# Patient Record
Sex: Male | Born: 1960
Health system: Southern US, Community
[De-identification: ages and names within clinical notes are randomized; demographics above are authoritative.]

## PROBLEM LIST (undated history)

## (undated) DIAGNOSIS — I480 Paroxysmal atrial fibrillation: Secondary | ICD-10-CM

## (undated) DIAGNOSIS — E785 Hyperlipidemia, unspecified: Secondary | ICD-10-CM

## (undated) DIAGNOSIS — I499 Cardiac arrhythmia, unspecified: Secondary | ICD-10-CM

## (undated) DIAGNOSIS — K922 Gastrointestinal hemorrhage, unspecified: Secondary | ICD-10-CM

## (undated) DIAGNOSIS — I10 Essential (primary) hypertension: Secondary | ICD-10-CM

## (undated) DIAGNOSIS — I517 Cardiomegaly: Secondary | ICD-10-CM

## (undated) DIAGNOSIS — E039 Hypothyroidism, unspecified: Secondary | ICD-10-CM

## (undated) DIAGNOSIS — H269 Unspecified cataract: Secondary | ICD-10-CM

## (undated) DIAGNOSIS — G4733 Obstructive sleep apnea (adult) (pediatric): Secondary | ICD-10-CM

## (undated) HISTORY — DX: Unspecified cataract: H26.9

## (undated) HISTORY — DX: Hyperlipidemia, unspecified: E78.5

## (undated) HISTORY — DX: Paroxysmal atrial fibrillation: I48.0

## (undated) HISTORY — DX: Gastrointestinal hemorrhage, unspecified: K92.2

## (undated) HISTORY — DX: Obstructive sleep apnea (adult) (pediatric): G47.33

## (undated) HISTORY — PX: DOPPLER ECHOCARDIOGRAPHY: SHX263

## (undated) HISTORY — DX: Hypothyroidism, unspecified: E03.9

## (undated) HISTORY — DX: Essential (primary) hypertension: I10

## (undated) HISTORY — DX: Cardiomegaly: I51.7

---

## 1999-05-26 ENCOUNTER — Emergency Department (HOSPITAL_COMMUNITY): Admission: EM | Admit: 1999-05-26 | Discharge: 1999-05-26 | Payer: Self-pay | Admitting: Emergency Medicine

## 1999-07-24 ENCOUNTER — Ambulatory Visit (HOSPITAL_BASED_OUTPATIENT_CLINIC_OR_DEPARTMENT_OTHER): Admission: RE | Admit: 1999-07-24 | Discharge: 1999-07-24 | Payer: Self-pay | Admitting: General Surgery

## 2001-08-26 ENCOUNTER — Emergency Department (HOSPITAL_COMMUNITY): Admission: EM | Admit: 2001-08-26 | Discharge: 2001-08-26 | Payer: Self-pay | Admitting: Emergency Medicine

## 2002-03-23 ENCOUNTER — Emergency Department (HOSPITAL_COMMUNITY): Admission: EM | Admit: 2002-03-23 | Discharge: 2002-03-23 | Payer: Self-pay | Admitting: Emergency Medicine

## 2002-03-29 ENCOUNTER — Emergency Department (HOSPITAL_COMMUNITY): Admission: EM | Admit: 2002-03-29 | Discharge: 2002-03-29 | Payer: Self-pay | Admitting: Emergency Medicine

## 2006-12-24 ENCOUNTER — Emergency Department (HOSPITAL_COMMUNITY): Admission: EM | Admit: 2006-12-24 | Discharge: 2006-12-24 | Payer: Self-pay | Admitting: Emergency Medicine

## 2009-04-04 ENCOUNTER — Ambulatory Visit: Payer: Self-pay | Admitting: Family Medicine

## 2009-04-17 ENCOUNTER — Ambulatory Visit: Payer: Self-pay | Admitting: Family Medicine

## 2009-04-23 ENCOUNTER — Ambulatory Visit: Payer: Self-pay | Admitting: Family Medicine

## 2009-05-02 ENCOUNTER — Ambulatory Visit: Payer: Self-pay | Admitting: Internal Medicine

## 2009-05-09 ENCOUNTER — Ambulatory Visit: Payer: Self-pay | Admitting: Internal Medicine

## 2009-06-18 ENCOUNTER — Ambulatory Visit: Payer: Self-pay | Admitting: Family Medicine

## 2009-09-16 ENCOUNTER — Ambulatory Visit: Payer: Self-pay | Admitting: Family Medicine

## 2009-12-17 ENCOUNTER — Ambulatory Visit: Payer: Self-pay | Admitting: Family Medicine

## 2010-03-19 ENCOUNTER — Ambulatory Visit: Payer: Self-pay | Admitting: Family Medicine

## 2010-06-18 ENCOUNTER — Ambulatory Visit: Payer: Self-pay | Admitting: Family Medicine

## 2010-06-25 ENCOUNTER — Ambulatory Visit
Admission: RE | Admit: 2010-06-25 | Discharge: 2010-06-25 | Payer: Self-pay | Source: Home / Self Care | Attending: Family Medicine | Admitting: Family Medicine

## 2010-08-06 ENCOUNTER — Ambulatory Visit (INDEPENDENT_AMBULATORY_CARE_PROVIDER_SITE_OTHER): Payer: BC Managed Care – PPO | Admitting: Family Medicine

## 2010-08-06 DIAGNOSIS — I10 Essential (primary) hypertension: Secondary | ICD-10-CM

## 2010-08-06 DIAGNOSIS — Z79899 Other long term (current) drug therapy: Secondary | ICD-10-CM

## 2010-10-03 ENCOUNTER — Ambulatory Visit: Payer: BC Managed Care – PPO | Admitting: Family Medicine

## 2011-03-16 ENCOUNTER — Encounter: Payer: Self-pay | Admitting: Family Medicine

## 2011-03-17 ENCOUNTER — Encounter: Payer: Self-pay | Admitting: Family Medicine

## 2011-03-17 ENCOUNTER — Ambulatory Visit (INDEPENDENT_AMBULATORY_CARE_PROVIDER_SITE_OTHER): Payer: BC Managed Care – PPO | Admitting: Family Medicine

## 2011-03-17 ENCOUNTER — Other Ambulatory Visit: Payer: Self-pay | Admitting: Family Medicine

## 2011-03-17 VITALS — BP 140/90 | HR 70 | Ht 72.0 in | Wt 253.0 lb

## 2011-03-17 DIAGNOSIS — I119 Hypertensive heart disease without heart failure: Secondary | ICD-10-CM | POA: Insufficient documentation

## 2011-03-17 DIAGNOSIS — Z0000 Encounter for general adult medical examination without abnormal findings: Secondary | ICD-10-CM

## 2011-03-17 DIAGNOSIS — E785 Hyperlipidemia, unspecified: Secondary | ICD-10-CM | POA: Insufficient documentation

## 2011-03-17 DIAGNOSIS — E039 Hypothyroidism, unspecified: Secondary | ICD-10-CM | POA: Insufficient documentation

## 2011-03-17 DIAGNOSIS — Z79899 Other long term (current) drug therapy: Secondary | ICD-10-CM

## 2011-03-17 DIAGNOSIS — I10 Essential (primary) hypertension: Secondary | ICD-10-CM | POA: Insufficient documentation

## 2011-03-17 DIAGNOSIS — Z23 Encounter for immunization: Secondary | ICD-10-CM

## 2011-03-17 LAB — TSH: TSH: 5.773 u[IU]/mL — ABNORMAL HIGH (ref 0.350–4.500)

## 2011-03-17 LAB — POCT URINALYSIS DIPSTICK
Clarity, UA: NEGATIVE
Leukocytes, UA: NEGATIVE
Protein, UA: NEGATIVE
Urobilinogen, UA: NEGATIVE
pH, UA: 5

## 2011-03-17 LAB — COMPREHENSIVE METABOLIC PANEL
ALT: 19 U/L (ref 0–53)
AST: 18 U/L (ref 0–37)
Alkaline Phosphatase: 40 U/L (ref 39–117)
BUN: 20 mg/dL (ref 6–23)
Calcium: 9.6 mg/dL (ref 8.4–10.5)
Chloride: 102 mEq/L (ref 96–112)
Creat: 1.24 mg/dL (ref 0.50–1.35)
Potassium: 3.6 mEq/L (ref 3.5–5.3)

## 2011-03-17 LAB — LIPID PANEL
HDL: 41 mg/dL (ref >39)
LDL Cholesterol: 57 mg/dL (ref 0–99)
Total CHOL/HDL Ratio: 2.8 Ratio
VLDL: 18 mg/dL (ref 0–40)

## 2011-03-17 MED ORDER — METOPROLOL SUCCINATE ER 100 MG PO TB24: 100.0000 mg | ORAL_TABLET | Freq: Every day | ORAL | Status: DC

## 2011-03-17 NOTE — Progress Notes (Signed)
  Subjective:    [name], male    DOB: Dec 15, 1960, 50 y.o.   MRN: [id number]  HPI He is here for a complete examination. He does have hypertension and presently is on medications listed in the chart. He is having no difficulty with them. He does have a history of hemorrhoids but is not presently having trouble. He also has a history of elevated TSH however is not on medications. Review of the record indicates a TSH as her in the borderline range of around 5. Old EKG it did show evidence of LVH   Review of Systems  Constitutional: Negative.   HENT: Negative.   Eyes: Negative.   Respiratory: Negative.   Cardiovascular: Negative.   Gastrointestinal: Negative.   Genitourinary: Negative.   Musculoskeletal: Negative.   Neurological: Negative.   Hematological: Negative.   Psychiatric/Behavioral: Negative.        Objective:   Physical Exam BP 140/90  Pulse 70  Ht 6' (1.829 m)  Wt 253 lb (114.76 kg)  BMI 34.31 kg/m2  General Appearance:    Alert, cooperative, no distress, appears stated age  Head:    Normocephalic, without obvious abnormality, atraumatic  Eyes:    PERRL, conjunctiva/corneas clear, EOM's intact, fundi    benign  Ears:    Normal TM's and external ear canals  Nose:   Nares normal, mucosa normal, no drainage or sinus   tenderness  Throat:   Lips, mucosa, and tongue normal; teeth and gums normal  Neck:   Supple, no lymphadenopathy;  thyroid:  no   enlargement/tenderness/nodules; no carotid   bruit or JVD  Back:    Spine nontender, no curvature, ROM normal, no CVA     tenderness  Lungs:     Clear to auscultation bilaterally without wheezes, rales or     ronchi; respirations unlabored  Chest Wall:    No tenderness or deformity   Heart:    Regular rate and rhythm, S1 and S2 normal, no murmur, rub   or gallop  Breast Exam:    No chest wall tenderness, masses or gynecomastia  Abdomen:     Soft, non-tender, nondistended, normoactive bowel sounds,    no  masses, no hepatosplenomegaly  Genitalia:   not done   Rectal:   deferred at patient request   Extremities:   No clubbing, cyanosis or edema  Pulses:   2+ and symmetric all extremities  Skin:   Skin color, texture, turgor normal, no rashes or lesions  Lymph nodes:   Cervical, supraclavicular, and axillary nodes normal  Neurologic:   CNII-XII intact, normal strength, sensation and gait; reflexes 2+ and symmetric throughout          Psych:   Normal mood, affect, hygiene and grooming.    EKG shows an improvement in LVH with no strain pattern       Assessment & Plan:   1. Physical exam, annual  POCT Urinalysis Dipstick  2. Hypertension  PR ELECTROCARDIOGRAM, COMPLETE, metoprolol (TOPROL XL) 100 MG 24 hr tablet  3. Hyperlipidemia LDL goal < 100  Lipid Profile  4. LVH (left ventricular hypertrophy) due to hypertensive disease  PR ELECTROCARDIOGRAM, COMPLETE  5. Hypothyroid  TSH  6. Encounter for long-term (current) use of other medications  CBC w/Diff, Comprehensive metabolic panel, Lipid Profile

## 2011-03-17 NOTE — Patient Instructions (Signed)
Work on changing your eating habits and cut back on WHITE food. I called and the new blood pressure medication and return here in one month for recheck

## 2011-03-18 LAB — CBC WITH DIFFERENTIAL/PLATELET
Basophils Absolute: 0 10*3/uL (ref 0.0–0.1)
Basophils Relative: 1 % (ref 0–1)
Eosinophils Relative: 2 % (ref 0–5)
Lymphocytes Relative: 30 % (ref 12–46)
MCHC: 32.9 g/dL (ref 30.0–36.0)
Neutro Abs: 4.2 10*3/uL (ref 1.7–7.7)
Platelets: 195 10*3/uL (ref 150–400)
RDW: 13.6 % (ref 11.5–15.5)
WBC: 7 10*3/uL (ref 4.0–10.5)

## 2011-03-20 ENCOUNTER — Telehealth: Payer: Self-pay

## 2011-03-20 NOTE — Telephone Encounter (Signed)
Called pt to let him know blood work is good

## 2011-04-07 LAB — BASIC METABOLIC PANEL WITH GFR
BUN: 15
CO2: 28
Chloride: 107
GFR calc Af Amer: >60
Potassium: 3.7

## 2011-04-07 LAB — CBC
HCT: 43.1
Hemoglobin: 14.1
MCHC: 32.8
MCV: 97.9
Platelets: 196
RBC: 4.4
RDW: 14.2 — ABNORMAL HIGH
WBC: 6.5

## 2011-04-07 LAB — TROPONIN I: Troponin I: 0.02

## 2011-04-07 LAB — DIFFERENTIAL
Basophils Absolute: 0.1
Basophils Relative: 1
Eosinophils Absolute: 0.1
Eosinophils Relative: 1
Lymphocytes Relative: 18
Lymphs Abs: 1.2
Monocytes Absolute: 0.6
Monocytes Relative: 9
Neutro Abs: 4.5
Neutrophils Relative %: 71

## 2011-04-07 LAB — BASIC METABOLIC PANEL
Calcium: 8.9
Creatinine, Ser: 1.13
GFR calc non Af Amer: >60
Glucose, Bld: 104 — ABNORMAL HIGH
Sodium: 138

## 2011-04-15 ENCOUNTER — Ambulatory Visit: Payer: BC Managed Care – PPO | Admitting: Family Medicine

## 2011-07-02 ENCOUNTER — Other Ambulatory Visit: Payer: Self-pay | Admitting: Family Medicine

## 2011-10-28 ENCOUNTER — Other Ambulatory Visit: Payer: Self-pay | Admitting: Family Medicine

## 2011-10-28 NOTE — Telephone Encounter (Signed)
PATIENT NEEDS TO SCHEDULE AN OFFICE VISIT. 

## 2011-11-28 ENCOUNTER — Other Ambulatory Visit: Payer: Self-pay | Admitting: Family Medicine

## 2011-12-30 ENCOUNTER — Other Ambulatory Visit: Payer: Self-pay | Admitting: Family Medicine

## 2011-12-30 ENCOUNTER — Telehealth: Payer: Self-pay | Admitting: Internal Medicine

## 2011-12-30 NOTE — Telephone Encounter (Signed)
Patient is due for office visit so before his medication runs out he will need to schedule a office visit.

## 2011-12-31 ENCOUNTER — Other Ambulatory Visit: Payer: Self-pay

## 2011-12-31 DIAGNOSIS — I10 Essential (primary) hypertension: Secondary | ICD-10-CM

## 2011-12-31 MED ORDER — PRAVASTATIN SODIUM 80 MG PO TABS: 80.0000 mg | ORAL_TABLET | Freq: Every day | ORAL | Status: DC

## 2011-12-31 MED ORDER — METOPROLOL SUCCINATE ER 100 MG PO TB24: 100.0000 mg | ORAL_TABLET | Freq: Every day | ORAL | Status: DC

## 2011-12-31 MED ORDER — OLMESARTAN-AMLODIPINE-HCTZ 40-10-25 MG PO TABS: ORAL_TABLET | ORAL | Status: DC

## 2011-12-31 NOTE — Telephone Encounter (Signed)
SENT MED IN 

## 2012-01-01 NOTE — Telephone Encounter (Signed)
done

## 2012-01-14 ENCOUNTER — Ambulatory Visit (INDEPENDENT_AMBULATORY_CARE_PROVIDER_SITE_OTHER): Payer: BC Managed Care – PPO | Admitting: Family Medicine

## 2012-01-14 ENCOUNTER — Encounter: Payer: Self-pay | Admitting: Family Medicine

## 2012-01-14 VITALS — BP 130/80 | HR 68 | Ht 72.0 in | Wt 252.0 lb

## 2012-01-14 DIAGNOSIS — E785 Hyperlipidemia, unspecified: Secondary | ICD-10-CM

## 2012-01-14 DIAGNOSIS — E039 Hypothyroidism, unspecified: Secondary | ICD-10-CM

## 2012-01-14 DIAGNOSIS — R7989 Other specified abnormal findings of blood chemistry: Secondary | ICD-10-CM

## 2012-01-14 DIAGNOSIS — I10 Essential (primary) hypertension: Secondary | ICD-10-CM

## 2012-01-14 DIAGNOSIS — Z0000 Encounter for general adult medical examination without abnormal findings: Secondary | ICD-10-CM

## 2012-01-14 LAB — CBC WITH DIFFERENTIAL/PLATELET
Basophils Relative: 1 % (ref 0–1)
Eosinophils Absolute: 0.1 10*3/uL (ref 0.0–0.7)
Eosinophils Relative: 3 % (ref 0–5)
HCT: 40.6 % (ref 39.0–52.0)
Hemoglobin: 13.4 g/dL (ref 13.0–17.0)
MCH: 31.2 pg (ref 26.0–34.0)
MCHC: 33 g/dL (ref 30.0–36.0)
MCV: 94.6 fL (ref 78.0–100.0)
Monocytes Absolute: 0.4 10*3/uL (ref 0.1–1.0)
Monocytes Relative: 8 % (ref 3–12)
Neutro Abs: 2.7 10*3/uL (ref 1.7–7.7)

## 2012-01-14 LAB — POCT URINALYSIS DIPSTICK
Ketones, UA: NEGATIVE
Protein, UA: NEGATIVE
Spec Grav, UA: 1.025
Urobilinogen, UA: NEGATIVE
pH, UA: 5

## 2012-01-14 LAB — COMPREHENSIVE METABOLIC PANEL
Albumin: 4.3 g/dL (ref 3.5–5.2)
Alkaline Phosphatase: 37 U/L — ABNORMAL LOW (ref 39–117)
BUN: 19 mg/dL (ref 6–23)
Creat: 1.19 mg/dL (ref 0.50–1.35)
Glucose, Bld: 102 mg/dL — ABNORMAL HIGH (ref 70–99)
Potassium: 4.2 mEq/L (ref 3.5–5.3)

## 2012-01-14 LAB — LIPID PANEL
HDL: 41 mg/dL (ref >39)
LDL Cholesterol: 55 mg/dL (ref 0–99)
Total CHOL/HDL Ratio: 2.8 Ratio
Triglycerides: 86 mg/dL (ref <150)

## 2012-01-14 MED ORDER — OLMESARTAN-AMLODIPINE-HCTZ 40-10-25 MG PO TABS: ORAL_TABLET | ORAL | Status: DC

## 2012-01-14 MED ORDER — METOPROLOL SUCCINATE ER 100 MG PO TB24: 100.0000 mg | ORAL_TABLET | Freq: Every day | ORAL | Status: DC

## 2012-01-14 MED ORDER — PRAVASTATIN SODIUM 80 MG PO TABS: 80.0000 mg | ORAL_TABLET | Freq: Every day | ORAL | Status: DC

## 2012-01-14 NOTE — Progress Notes (Signed)
  Subjective:    [name], male    DOB: 30-Nov-1960, 51 y.o.   MRN: [id number]  HPI He is here for a complete examination. He is getting ready to get laid off and would like his medications renewed. He has no particular concerns or complaints. Review his record indicates he is up-to-date on his immunizations as well as colonoscopy. He does have a previous history of slightly elevated TSH with repeat being normal.   Review of Systems  Constitutional: Negative.   HENT: Negative.   Eyes: Negative.   Respiratory: Negative.   Cardiovascular: Negative.   Gastrointestinal: Negative.   Genitourinary: Negative.   Musculoskeletal: Negative.   Skin: Negative.   Neurological: Negative.   Hematological: Negative.   Psychiatric/Behavioral: Negative.        Objective:   Physical Exam BP 130/80  Pulse 68  Ht 6' (1.829 m)  Wt 252 lb (114.306 kg)  BMI 34.18 kg/m2  SpO2 98%  General Appearance:    Alert, cooperative, no distress, appears stated age  Head:    Normocephalic, without obvious abnormality, atraumatic  Eyes:    PERRL, conjunctiva/corneas clear, EOM's intact, fundi    benign  Ears:    Normal TM's and external ear canals  Nose:   Nares normal, mucosa normal, no drainage or sinus   tenderness  Throat:   Lips, mucosa, and tongue normal; teeth and gums normal  Neck:   Supple, no lymphadenopathy;  thyroid:  no   enlargement/tenderness/nodules; no carotid   bruit or JVD  Back:    Spine nontender, no curvature, ROM normal, no CVA     tenderness  Lungs:     Clear to auscultation bilaterally without wheezes, rales or     ronchi; respirations unlabored  Chest Wall:    No tenderness or deformity   Heart:    Regular rate and rhythm, S1 and S2 normal, no murmur, rub   or gallop  Breast Exam:    No chest wall tenderness, masses or gynecomastia  Abdomen:     Soft, non-tender, nondistended, normoactive bowel sounds,    no masses, no hepatosplenomegaly  Genitalia:   deferred    Rectal:   deferred   Extremities:   No clubbing, cyanosis or edema  Pulses:   2+ and symmetric all extremities  Skin:   Skin color, texture, turgor normal, no rashes or lesions  Lymph nodes:   Cervical, supraclavicular, and axillary nodes normal  Neurologic:   CNII-XII intact, normal strength, sensation and gait; reflexes 2+ and symmetric throughout          Psych:   Normal mood, affect, hygiene and grooming.           Assessment & Plan:   1. Routine general medical examination at a health care facility  POCT Urinalysis Dipstick  2. Elevated TSH  TSH  3. Hyperlipidemia LDL goal < 100  pravastatin (PRAVACHOL) 80 MG tablet  4. Hypertension  Olmesartan-Amlodipine-HCTZ (TRIBENZOR) 40-10-25 MG TABS, metoprolol succinate (TOPROL XL) 100 MG 24 hr tablet  5. Hypothyroid

## 2012-01-18 ENCOUNTER — Encounter: Payer: BC Managed Care – PPO | Admitting: Family Medicine

## 2012-01-25 ENCOUNTER — Other Ambulatory Visit: Payer: Self-pay | Admitting: Family Medicine

## 2012-03-17 ENCOUNTER — Encounter: Payer: BC Managed Care – PPO | Admitting: Family Medicine

## 2012-03-21 ENCOUNTER — Encounter: Payer: BC Managed Care – PPO | Admitting: Family Medicine

## 2013-01-09 ENCOUNTER — Telehealth: Payer: Self-pay | Admitting: Family Medicine

## 2013-01-09 DIAGNOSIS — E785 Hyperlipidemia, unspecified: Secondary | ICD-10-CM

## 2013-01-09 DIAGNOSIS — I10 Essential (primary) hypertension: Secondary | ICD-10-CM

## 2013-01-09 MED ORDER — OLMESARTAN-AMLODIPINE-HCTZ 40-10-25 MG PO TABS: ORAL_TABLET | ORAL | Status: DC

## 2013-01-09 MED ORDER — PRAVASTATIN SODIUM 80 MG PO TABS: 80.0000 mg | ORAL_TABLET | Freq: Every day | ORAL | Status: DC

## 2013-01-09 MED ORDER — METOPROLOL SUCCINATE ER 100 MG PO TB24: 100.0000 mg | ORAL_TABLET | Freq: Every day | ORAL | Status: DC

## 2013-01-09 NOTE — Telephone Encounter (Signed)
SENT MED IN 

## 2013-01-24 ENCOUNTER — Telehealth: Payer: Self-pay | Admitting: Family Medicine

## 2013-01-24 NOTE — Telephone Encounter (Signed)
Denied and faxed back., pt is being seen 8/22 and dr. Stann Mainland fill it then

## 2013-01-24 NOTE — Telephone Encounter (Signed)
Received fax refill request from [facility]   Metoprolol er 100 mg  1 tablet once daily and Pravastatin 40mg   2 tablets once daily

## 2013-02-10 ENCOUNTER — Encounter: Payer: Self-pay | Admitting: Family Medicine

## 2013-02-10 ENCOUNTER — Ambulatory Visit (INDEPENDENT_AMBULATORY_CARE_PROVIDER_SITE_OTHER): Payer: BC Managed Care – PPO | Admitting: Family Medicine

## 2013-02-10 ENCOUNTER — Encounter: Payer: Self-pay | Admitting: Internal Medicine

## 2013-02-10 VITALS — BP 148/90 | HR 64 | Ht 73.0 in | Wt 238.0 lb

## 2013-02-10 DIAGNOSIS — E785 Hyperlipidemia, unspecified: Secondary | ICD-10-CM

## 2013-02-10 DIAGNOSIS — Z125 Encounter for screening for malignant neoplasm of prostate: Secondary | ICD-10-CM

## 2013-02-10 DIAGNOSIS — R7989 Other specified abnormal findings of blood chemistry: Secondary | ICD-10-CM

## 2013-02-10 DIAGNOSIS — Z23 Encounter for immunization: Secondary | ICD-10-CM

## 2013-02-10 DIAGNOSIS — R195 Other fecal abnormalities: Secondary | ICD-10-CM

## 2013-02-10 DIAGNOSIS — I10 Essential (primary) hypertension: Secondary | ICD-10-CM

## 2013-02-10 DIAGNOSIS — Z0000 Encounter for general adult medical examination without abnormal findings: Secondary | ICD-10-CM

## 2013-02-10 DIAGNOSIS — K649 Unspecified hemorrhoids: Secondary | ICD-10-CM | POA: Insufficient documentation

## 2013-02-10 DIAGNOSIS — R946 Abnormal results of thyroid function studies: Secondary | ICD-10-CM

## 2013-02-10 DIAGNOSIS — I119 Hypertensive heart disease without heart failure: Secondary | ICD-10-CM

## 2013-02-10 LAB — HEMOCCULT GUIAC POC 1CARD (OFFICE)

## 2013-02-10 LAB — POCT URINALYSIS DIPSTICK
Bilirubin, UA: NEGATIVE
Blood, UA: NEGATIVE
Ketones, UA: NEGATIVE
Nitrite, UA: NEGATIVE
pH, UA: 5

## 2013-02-10 MED ORDER — PRAVASTATIN SODIUM 80 MG PO TABS: 80.0000 mg | ORAL_TABLET | Freq: Every day | ORAL | Status: DC

## 2013-02-10 MED ORDER — OLMESARTAN-AMLODIPINE-HCTZ 40-10-25 MG PO TABS: ORAL_TABLET | ORAL | Status: DC

## 2013-02-10 MED ORDER — METOPROLOL SUCCINATE ER 100 MG PO TB24: 100.0000 mg | ORAL_TABLET | Freq: Every day | ORAL | Status: DC

## 2013-02-10 NOTE — Progress Notes (Signed)
Subjective:    [name], male    DOB: October 10, 1960, 52 y.o.   MRN: [id number]  HPI He is here for complete examination. He continues on his blood pressure medications as well as pravastatin. He has not had any difficulty with that. He has no particular concerns or complaints. Review his record indicates an elevated TSH in the past but apparently he has not been placed on any thyroid medications. His social and family history were reviewed. He has been married for 3 years and this is going well. He has had a colonoscopy, his immunizations are up to date.   Review of Systems  Constitutional: Negative.   HENT: Negative.   Respiratory: Negative.   Cardiovascular: Negative.   Gastrointestinal: Negative.   Endocrine: Negative.   Genitourinary: Negative.   Allergic/Immunologic: Negative.   Neurological: Negative.   Hematological: Negative.        Objective:   Physical Exam BP 148/90  Pulse 64  Ht 6\' 1"  (1.854 m)  Wt 238 lb (107.956 kg)  BMI 31.41 kg/m2  General Appearance:    Alert, cooperative, no distress, appears stated age  Head:    Normocephalic, without obvious abnormality, atraumatic  Eyes:    PERRL, conjunctiva/corneas clear, EOM's intact, fundi   Difficult to visualize   Ears:    Normal TM's and external ear canals  Nose:   Nares normal, mucosa normal, no drainage or sinus   tenderness  Throat:   Lips, mucosa, and tongue normal; teeth and gums normal  Neck:   Supple, no lymphadenopathy;  thyroid:  no   enlargement/tenderness/nodules; no carotid   bruit or JVD  Back:    Spine nontender, no curvature, ROM normal, no CVA     tenderness  Lungs:     Clear to auscultation bilaterally without wheezes, rales or     ronchi; respirations unlabored  Chest Wall:    No tenderness or deformity   Heart:    Regular rate and rhythm, S1 and S2 normal, no murmur, rub   or gallop  Breast Exam:    No chest wall tenderness, masses or gynecomastia  Abdomen:     Soft,  non-tender, nondistended, normoactive bowel sounds,    no masses, no hepatosplenomegaly  Genitalia:    Normal male external genitalia without lesions.  Testicles without masses.  No inguinal hernias.  Rectal:    Normal sphincter tone, extensive hemorrhoids are noted. Prostate was difficult to feel. Stool is guaiac positive..  Extremities:   No clubbing, cyanosis or edema  Pulses:   2+ and symmetric all extremities  Skin:   Skin color, texture, turgor normal, no rashes or lesions  Lymph nodes:   Cervical, supraclavicular, and axillary nodes normal  Neurologic:   CNII-XII intact, normal strength, sensation and gait; reflexes 2+ and symmetric throughout          Psych:   Normal mood, affect, hygiene and grooming.          Assessment & Plan:  Routine general medical examination at a health care facility - Plan: Flu vaccine 6-1mo preservative free IM, CBC with Differential, Comprehensive metabolic panel, Lipid panel, TSH, PSA, Hemoccult - 1 Card (office)  Hyperlipidemia LDL goal < 100 - Plan: POCT Urinalysis Dipstick, Lipid panel, pravastatin (PRAVACHOL) 80 MG tablet  Elevated TSH - Plan: TSH  Special screening for malignant neoplasm of prostate - Plan: PSA  Hypertension - Plan: CBC with Differential, Comprehensive metabolic panel, Lipid panel, metoprolol succinate (TOPROL XL) 100 MG 24  hr tablet, Olmesartan-Amlodipine-HCTZ (TRIBENZOR) 40-10-25 MG TABS  LVH (left ventricular hypertrophy) due to hypertensive disease - Plan: CBC with Differential, Comprehensive metabolic panel, Lipid panel  Hemorrhoids - Plan: Ambulatory referral to Gastroenterology  Stool guaiac positive - Plan: Ambulatory referral to Gastroenterology  I also encouraged him to visit his eye doctor for followup since it has been 2 years. Recheck his blood pressure in another month and possibly readjust his medications. He did state that the [facility] was expensive.

## 2013-02-10 NOTE — Patient Instructions (Signed)
Come back in one month and we'll check your pressure again

## 2013-02-11 LAB — CBC WITH DIFFERENTIAL/PLATELET
Basophils Relative: 1 % (ref 0–1)
Eosinophils Absolute: 0.2 10*3/uL (ref 0.0–0.7)
HCT: 40.7 % (ref 39.0–52.0)
Hemoglobin: 13.8 g/dL (ref 13.0–17.0)
MCH: 32.7 pg (ref 26.0–34.0)
MCHC: 33.9 g/dL (ref 30.0–36.0)
Monocytes Absolute: 0.4 10*3/uL (ref 0.1–1.0)
Monocytes Relative: 9 % (ref 3–12)

## 2013-02-11 LAB — COMPREHENSIVE METABOLIC PANEL
Alkaline Phosphatase: 45 U/L (ref 39–117)
BUN: 17 mg/dL (ref 6–23)
Glucose, Bld: 93 mg/dL (ref 70–99)
Total Bilirubin: 1.2 mg/dL (ref 0.3–1.2)

## 2013-02-11 LAB — LIPID PANEL
HDL: 48 mg/dL (ref >39)
LDL Cholesterol: 60 mg/dL (ref 0–99)
Triglycerides: 71 mg/dL (ref <150)
VLDL: 14 mg/dL (ref 0–40)

## 2013-02-13 NOTE — Progress Notes (Signed)
Quick Note:  CALLED PT CELL # LEFT MESSAGE WORD FOR WORD All labs including PSA and TSH are WNL ______

## 2013-02-16 ENCOUNTER — Telehealth: Payer: Self-pay | Admitting: Family Medicine

## 2013-02-21 MED ORDER — LOSARTAN POTASSIUM-HCTZ 100-12.5 MG PO TABS: 1.0000 | ORAL_TABLET | Freq: Every day | ORAL | Status: DC

## 2013-02-21 MED ORDER — AMLODIPINE BESYLATE 10 MG PO TABS: 10.0000 mg | ORAL_TABLET | Freq: Every day | ORAL | Status: DC

## 2013-02-21 NOTE — Telephone Encounter (Signed)
His insurance would not cover TRIBENZOR. I will therefore switch him to another medication and recheck in one month.

## 2013-02-21 NOTE — Telephone Encounter (Signed)
Tell him that his insurance would not cover his present medication. Let him know that I called in to medications and have him recheck here in one month

## 2013-03-10 ENCOUNTER — Encounter: Payer: Self-pay | Admitting: Internal Medicine

## 2013-03-10 ENCOUNTER — Ambulatory Visit (INDEPENDENT_AMBULATORY_CARE_PROVIDER_SITE_OTHER): Payer: BC Managed Care – PPO | Admitting: Internal Medicine

## 2013-03-10 VITALS — BP 138/94 | HR 68 | Ht 72.0 in | Wt 243.5 lb

## 2013-03-10 DIAGNOSIS — K649 Unspecified hemorrhoids: Secondary | ICD-10-CM

## 2013-03-10 DIAGNOSIS — K648 Other hemorrhoids: Secondary | ICD-10-CM

## 2013-03-10 DIAGNOSIS — K625 Hemorrhage of anus and rectum: Secondary | ICD-10-CM

## 2013-03-10 MED ORDER — HYDROCORTISONE 1 % RE CREA: TOPICAL_CREAM | RECTAL | Status: DC

## 2013-03-10 MED ORDER — HYDROCORTISONE ACETATE 25 MG RE SUPP: 25.0000 mg | Freq: Every day | RECTAL | Status: DC

## 2013-03-10 NOTE — Progress Notes (Signed)
Martin [name] Aug 26, 1960 MRN [id number]   History of Present Illness:  This is a 52 year old African American male with symptomatic hemorrhoids. 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 Several times a week, he sees bright red blood on the toilet tissue. His most recent hemoglobin was 13.8, hematocrit 40.7 and MCV 96. His last colonoscopy in November 2010 was a normal exam. He denies abdominal pain. There is no family history of colon cancer. His bowel movements are every other day.   Past Medical History  Diagnosis Date  . Hypertension   . Hyperlipidemia   . Hypothyroidism   . Cardiomegaly     LVH  . Lower GI bleeding   . Hemorrhoids    Past Surgical History  Procedure Laterality Date  . Doppler echocardiography      reports that he has never smoked. He has never used smokeless tobacco. He reports that he does not drink alcohol or use illicit drugs. family history includes Cancer in his father and mother. No Known Allergies      Review of Systems: Denies weight loss abdominal pain diarrhea  The remainder of the 10 point ROS is negative except as outlined in H&P   Physical Exam: General appearance  Well developed, in no distress. Eyes- non icteric. HEENT nontraumatic, normocephalic. Mouth no lesions, tongue papillated, no cheilosis. Neck supple without adenopathy, thyroid not enlarged, no carotid bruits, no JVD. Lungs Clear to auscultation bilaterally. Cor normal S1, normal S2, regular rhythm, no murmur,  quiet precordium. Abdomen: Soft nontender abdomen with normal active bowel sounds. No fullness. Rectal: And anoscopic exam revealed large external hemorrhoids which are not bleeding. Somewhat increased rectal sphincter tone and large first-degree internal hemorrhoids circumferentially which are edematous and bleed on contact with the anoscope. Small amount of stool in the rectal ampulla is  Hemoccult-positive. Extremities no pedal edema. Skin no lesions. Neurological alert and oriented x 3. Psychological normal mood and affect.  Assessment and Plan:  Problem #29 52 year old [place] American male with symptomatic external and internal hemorrhoids. These have been symptomatic as to pain, itching and bleeding. He has not tried any suppositories yet so we will start him on conservative treatment with Anusol-HC suppositories at bedtime and 1% hydrocortisone cream externally 3 times a day. I have discussed with him hemorrhoidal banding which could be done as an office procedure. Since he has both internal and external hemorrhoids, his hemorrhoidal surgery would have to be done by a general surgeon, possibility doing PPH. He both agreed that we will try conservative measures first and I will see him in 8 weeks and we will reassess. He is up-to-date on his colonoscopy. The next exam will be due in November 2020. He is to continue aspirin 81 mg daily. We have given him information on hemorrhoids today and I told him he can use a doughnut or a soft pillow while driving.   [id number] Martin [name]

## 2013-03-10 NOTE — Patient Instructions (Addendum)
We have sent the following medications to your pharmacy for you to pick up at your convenience: Anusol Suppositories Hydrocortisone Cream three times daily as needed  Please follow up with Dr Juanda Chance in 2 months.  Hemorrhoids Hemorrhoids are swollen veins around the rectum or anus. There are two types of hemorrhoids:   Internal hemorrhoids. These occur in the veins just inside the rectum. They may poke through to the outside and become irritated and painful.  External hemorrhoids. These occur in the veins outside the anus and can be felt as a painful swelling or hard lump near the anus. CAUSES  Pregnancy.   Obesity.   Constipation or diarrhea.   Straining to have a bowel movement.   Sitting for long periods on the toilet.  Heavy lifting or other activity that caused you to strain.  Anal intercourse. SYMPTOMS   Pain.   Anal itching or irritation.   Rectal bleeding.   Fecal leakage.   Anal swelling.   One or more lumps around the anus.  DIAGNOSIS  Your caregiver may be able to diagnose hemorrhoids by visual examination. Other examinations or tests that may be performed include:   Examination of the rectal area with a gloved hand (digital rectal exam).   Examination of anal canal using a small tube (scope).   A blood test if you have lost a significant amount of blood.  A test to look inside the colon (sigmoidoscopy or colonoscopy). TREATMENT Most hemorrhoids can be treated at home. However, if symptoms do not seem to be getting better or if you have a lot of rectal bleeding, your caregiver may perform a procedure to help make the hemorrhoids get smaller or remove them completely. Possible treatments include:   Placing a rubber band at the base of the hemorrhoid to cut off the circulation (rubber band ligation).   Injecting a chemical to shrink the hemorrhoid (sclerotherapy).   Using a tool to burn the hemorrhoid (infrared light therapy).    Surgically removing the hemorrhoid (hemorrhoidectomy).   Stapling the hemorrhoid to block blood flow to the tissue (hemorrhoid stapling).  HOME CARE INSTRUCTIONS   Eat foods with fiber, such as whole grains, beans, nuts, fruits, and vegetables. Ask your doctor about taking products with added fiber in them (fibersupplements).  Increase fluid intake. Drink enough water and fluids to keep your urine clear or pale yellow.   Exercise regularly.   Go to the bathroom when you have the urge to have a bowel movement. Do not wait.   Avoid straining to have bowel movements.   Keep the anal area dry and clean. Use wet toilet paper or moist towelettes after a bowel movement.   Medicated creams and suppositories may be used or applied as directed.   Only take over-the-counter or prescription medicines as directed by your caregiver.   Take warm sitz baths for 15 20 minutes, 3 4 times a day to ease pain and discomfort.   Place ice packs on the hemorrhoids if they are tender and swollen. Using ice packs between sitz baths may be helpful.   Put ice in a plastic bag.   Place a towel between your skin and the bag.   Leave the ice on for 15 20 minutes, 3 4 times a day.   Do not use a donut-shaped pillow or sit on the toilet for long periods. This increases blood pooling and pain.  SEEK MEDICAL CARE IF:  You have increasing pain and swelling that is not controlled  by treatment or medicine.  You have uncontrolled bleeding.  You have difficulty or you are unable to have a bowel movement.  You have pain or inflammation outside the area of the hemorrhoids. MAKE SURE YOU:  Understand these instructions.  Will watch your condition.  Will get help right away if you are not doing well or get worse. Document Released: 06/05/2000 Document Revised: 05/25/2012 Document Reviewed: 04/12/2012 [facility] Patient Information 2014 [place], [place].   CC: Dr Susann Givens

## 2013-03-14 ENCOUNTER — Ambulatory Visit: Payer: BC Managed Care – PPO | Admitting: Family Medicine

## 2013-03-29 ENCOUNTER — Other Ambulatory Visit: Payer: Self-pay

## 2013-03-29 MED ORDER — PRAVASTATIN SODIUM 40 MG PO TABS: 40.0000 mg | ORAL_TABLET | Freq: Two times a day (BID) | ORAL | Status: DC

## 2013-03-29 NOTE — Telephone Encounter (Signed)
SENT IN PRAVASTATIN 40 BID PER PT REQUEST IT IS CHEAPER THAN THE 80 MG

## 2013-05-10 ENCOUNTER — Ambulatory Visit: Payer: BC Managed Care – PPO | Admitting: Internal Medicine

## 2013-09-01 ENCOUNTER — Other Ambulatory Visit: Payer: Self-pay | Admitting: Family Medicine

## 2013-10-09 ENCOUNTER — Other Ambulatory Visit: Payer: Self-pay | Admitting: Family Medicine

## 2013-12-04 ENCOUNTER — Other Ambulatory Visit: Payer: Self-pay | Admitting: Family Medicine

## 2014-01-21 ENCOUNTER — Ambulatory Visit (INDEPENDENT_AMBULATORY_CARE_PROVIDER_SITE_OTHER): Payer: BC Managed Care – PPO | Admitting: Physician Assistant

## 2014-01-21 VITALS — BP 144/80 | HR 57 | Temp 97.5°F | Resp 16 | Ht 71.75 in | Wt 235.2 lb

## 2014-01-21 DIAGNOSIS — T148 Other injury of unspecified body region: Secondary | ICD-10-CM

## 2014-01-21 DIAGNOSIS — W57XXXA Bitten or stung by nonvenomous insect and other nonvenomous arthropods, initial encounter: Secondary | ICD-10-CM

## 2014-01-21 DIAGNOSIS — L299 Pruritus, unspecified: Secondary | ICD-10-CM

## 2014-01-21 MED ORDER — TRIAMCINOLONE ACETONIDE 0.5 % EX CREA: 1.0000 "application " | TOPICAL_CREAM | Freq: Three times a day (TID) | CUTANEOUS | Status: DC

## 2014-01-21 MED ORDER — CETIRIZINE HCL 10 MG PO TABS: 10.0000 mg | ORAL_TABLET | Freq: Every day | ORAL | Status: DC

## 2014-01-21 NOTE — Progress Notes (Signed)
   Subjective:    [name], male    DOB: 01/13/1961, 53 y.o.   MRN: [id number]  HPI  Pt presents to clinic with bumps on his arms and leg and one on his neck.  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  He lives with his wife who does not have similar rash.  The rash is really itchy.  He has used nothing on the rash.  Review of Systems  Constitutional: Negative for fever and chills.  Skin: Positive for rash.       Objective:   Physical Exam  Vitals reviewed. Constitutional: He appears well-developed and well-nourished.  HENT:  Head: Normocephalic and atraumatic.  Right Ear: External ear normal.  Left Ear: External ear normal.  Eyes: Conjunctivae are normal.  Neck: Normal range of motion.  Pulmonary/Chest: Effort normal.  Skin: Skin is warm and dry. Rash noted.  Erythematous papules - 1 on left neck, several on both of his arms, and 3 on his left lower leg.  The all have central excoriations without any signs of infection.  Psychiatric: He has a normal mood and affect. His behavior is normal. Judgment and thought content normal.       Assessment & Plan:  Bug bites - Plan: triamcinolone cream (KENALOG) 0.5 %  Itching - Plan: cetirizine (ZYRTEC) 10 MG tablet  Symptomatic care d/w pt.  Benny LennertSarah Jaiona Simien PA-C  [facility] 01/21/2014 12:12 PM

## 2014-01-24 ENCOUNTER — Ambulatory Visit: Payer: BC Managed Care – PPO | Admitting: Medical

## 2014-01-30 ENCOUNTER — Encounter: Payer: Self-pay | Admitting: Internal Medicine

## 2014-02-07 ENCOUNTER — Encounter: Payer: Self-pay | Admitting: Family Medicine

## 2014-02-15 ENCOUNTER — Ambulatory Visit (INDEPENDENT_AMBULATORY_CARE_PROVIDER_SITE_OTHER): Payer: BC Managed Care – PPO | Admitting: Family Medicine

## 2014-02-15 ENCOUNTER — Encounter: Payer: Self-pay | Admitting: Family Medicine

## 2014-02-15 VITALS — BP 130/90 | HR 67 | Ht 72.0 in | Wt 241.0 lb

## 2014-02-15 DIAGNOSIS — Z23 Encounter for immunization: Secondary | ICD-10-CM

## 2014-02-15 DIAGNOSIS — I10 Essential (primary) hypertension: Secondary | ICD-10-CM

## 2014-02-15 DIAGNOSIS — H269 Unspecified cataract: Secondary | ICD-10-CM

## 2014-02-15 DIAGNOSIS — E039 Hypothyroidism, unspecified: Secondary | ICD-10-CM

## 2014-02-15 DIAGNOSIS — K649 Unspecified hemorrhoids: Secondary | ICD-10-CM

## 2014-02-15 DIAGNOSIS — Z0000 Encounter for general adult medical examination without abnormal findings: Secondary | ICD-10-CM

## 2014-02-15 DIAGNOSIS — I119 Hypertensive heart disease without heart failure: Secondary | ICD-10-CM

## 2014-02-15 DIAGNOSIS — E785 Hyperlipidemia, unspecified: Secondary | ICD-10-CM

## 2014-02-15 LAB — CBC WITH DIFFERENTIAL/PLATELET
BASOS ABS: 0.1 10*3/uL (ref 0.0–0.1)
BASOS PCT: 1 % (ref 0–1)
EOS PCT: 4 % (ref 0–5)
Eosinophils Absolute: 0.2 10*3/uL (ref 0.0–0.7)
HEMATOCRIT: 40.2 % (ref 39.0–52.0)
HEMOGLOBIN: 13.4 g/dL (ref 13.0–17.0)
Lymphocytes Relative: 35 % (ref 12–46)
Lymphs Abs: 2 10*3/uL (ref 0.7–4.0)
MCH: 31.9 pg (ref 26.0–34.0)
MCHC: 33.3 g/dL (ref 30.0–36.0)
MCV: 95.7 fL (ref 78.0–100.0)
MONO ABS: 0.7 10*3/uL (ref 0.1–1.0)
MONOS PCT: 12 % (ref 3–12)
NEUTROS ABS: 2.8 10*3/uL (ref 1.7–7.7)
Neutrophils Relative %: 48 % (ref 43–77)
Platelets: 189 10*3/uL (ref 150–400)
RBC: 4.2 MIL/uL — ABNORMAL LOW (ref 4.22–5.81)
RDW: 13.8 % (ref 11.5–15.5)
WBC: 5.8 10*3/uL (ref 4.0–10.5)

## 2014-02-15 LAB — COMPREHENSIVE METABOLIC PANEL
ALBUMIN: 4.2 g/dL (ref 3.5–5.2)
ALK PHOS: 50 U/L (ref 39–117)
ALT: 14 U/L (ref 0–53)
AST: 15 U/L (ref 0–37)
BUN: 18 mg/dL (ref 6–23)
CALCIUM: 9.1 mg/dL (ref 8.4–10.5)
CHLORIDE: 105 meq/L (ref 96–112)
CO2: 26 mEq/L (ref 19–32)
Creat: 1.11 mg/dL (ref 0.50–1.35)
GLUCOSE: 90 mg/dL (ref 70–99)
POTASSIUM: 4.1 meq/L (ref 3.5–5.3)
Sodium: 140 mEq/L (ref 135–145)
Total Bilirubin: 1.1 mg/dL (ref 0.2–1.2)
Total Protein: 7.1 g/dL (ref 6.0–8.3)

## 2014-02-15 LAB — POCT URINALYSIS DIPSTICK
BILIRUBIN UA: NEGATIVE
Blood, UA: NEGATIVE
Glucose, UA: NEGATIVE
KETONES UA: NEGATIVE
LEUKOCYTES UA: NEGATIVE
Nitrite, UA: NEGATIVE
PH UA: 5
Urobilinogen, UA: NEGATIVE

## 2014-02-15 LAB — LIPID PANEL
CHOL/HDL RATIO: 2.8 ratio
Cholesterol: 119 mg/dL (ref 0–200)
HDL: 43 mg/dL (ref >39)
LDL Cholesterol: 54 mg/dL (ref 0–99)
TRIGLYCERIDES: 112 mg/dL (ref <150)
VLDL: 22 mg/dL (ref 0–40)

## 2014-02-15 MED ORDER — METOPROLOL SUCCINATE ER 100 MG PO TB24: 100.0000 mg | ORAL_TABLET | Freq: Every day | ORAL | Status: DC

## 2014-02-15 MED ORDER — PRAVASTATIN SODIUM 40 MG PO TABS: ORAL_TABLET | ORAL | Status: DC

## 2014-02-15 MED ORDER — AMLODIPINE BESYLATE 10 MG PO TABS: ORAL_TABLET | ORAL | Status: DC

## 2014-02-15 MED ORDER — LOSARTAN POTASSIUM-HCTZ 100-12.5 MG PO TABS: ORAL_TABLET | ORAL | Status: DC

## 2014-02-15 NOTE — Progress Notes (Signed)
Subjective:    [name], male    DOB: March 19, 1961, 53 y.o.   MRN: [id number]  HPI He is here for complete examination. He has no particular concerns or complaints. Presently he is taking multiple medications for his hypertension and doing well on them. He continues on Pravachol for his hyperlipidemia. He has a previous history of hemorrhoids and presently is having no difficulty with that. He has had a colonoscopy. He has remote history of questionable hypothyroidism however he is not presently on any medications. He also has a history of cataracts and is being followed for that. Past family and social history was reviewed. Family history significant for multiple male siblings dying of cancer and CVA however he is unsure of what kind. All his mother died in her 84s. He has sisters and they're alive and well. He is married. His marriage is going well. His work continues to go well. Review his record indicates he was seen in the emergency room and an echo was done for evaluation of chest discomfort. He did have evidence of LVH.  Review of Systems  All other systems reviewed and are negative.      Objective:   Physical Exam BP 130/90  Pulse 67  Ht 6' (1.829 m)  Wt 241 lb (109.317 kg)  BMI 32.68 kg/m2  SpO2 97%  General Appearance:    Alert, cooperative, no distress, appears stated age  Head:    Normocephalic, without obvious abnormality, atraumatic  Eyes:    PERRL, conjunctiva/corneas clear, EOM's intact  Ears:    Normal TM's and external ear canals  Nose:   Nares normal, mucosa normal, no drainage or sinus   tenderness  Throat:   Lips, mucosa, and tongue normal; teeth and gums normal  Neck:   Supple, no lymphadenopathy;  thyroid:  no   enlargement/tenderness/nodules; no carotid   bruit or JVD  Back:    Spine nontender, no curvature, ROM normal, no CVA     tenderness  Lungs:     Clear to auscultation bilaterally without wheezes, rales or     ronchi; respirations  unlabored  Chest Wall:    No tenderness or deformity   Heart:    Regular rate and rhythm, S1 and S2 normal, no murmur, rub   or gallop  Breast Exam:    No chest wall tenderness, masses or gynecomastia  Abdomen:     Soft, non-tender, nondistended, normoactive bowel sounds,    no masses, no hepatosplenomegaly        Extremities:   No clubbing, cyanosis or edema  Pulses:   2+ and symmetric all extremities  Skin:   Skin color, texture, turgor normal, no rashes or lesions  Lymph nodes:   Cervical, supraclavicular, and axillary nodes normal  Neurologic:   CNII-XII intact, normal strength, sensation and gait; reflexes 2+ and symmetric throughout          Psych:   Normal mood, affect, hygiene and grooming.          Assessment & Plan:  Routine general medical examination at a health care facility - Plan: POCT Urinalysis Dipstick, Flu Vaccine QUAD 36+ mos IM, CBC with Differential, Comprehensive metabolic panel, Lipid panel  Essential hypertension - Plan: metoprolol succinate (TOPROL XL) 100 MG 24 hr tablet, losartan-hydrochlorothiazide (HYZAAR) 100-12.5 MG per tablet, amLODipine (NORVASC) 10 MG tablet, CBC with Differential, Comprehensive metabolic panel  Hypothyroidism, unspecified hypothyroidism type - Plan: TSH  Hyperlipidemia with target LDL less than 100 - Plan:  pravastatin (PRAVACHOL) 40 MG tablet, Lipid panel  Hemorrhoids, unspecified hemorrhoid type  LVH (left ventricular hypertrophy) due to hypertensive disease, without heart failure  Cataract  Need for prophylactic vaccination and inoculation against influenza - Plan: Flu Vaccine QUAD 36+ mos IM  his medications were renewed. I will follow up with a TSH to find out about his thyroid function. Encouraged him to continue with his present active lifestyle.

## 2014-02-16 LAB — TSH: TSH: 4.07 u[IU]/mL (ref 0.350–4.500)

## 2015-02-18 ENCOUNTER — Encounter: Payer: Self-pay | Admitting: Family Medicine

## 2015-02-18 ENCOUNTER — Ambulatory Visit (INDEPENDENT_AMBULATORY_CARE_PROVIDER_SITE_OTHER): Payer: BC Managed Care – PPO | Admitting: Family Medicine

## 2015-02-18 ENCOUNTER — Other Ambulatory Visit: Payer: Self-pay | Admitting: Family Medicine

## 2015-02-18 VITALS — BP 150/90 | HR 66 | Ht 72.5 in | Wt 253.0 lb

## 2015-02-18 DIAGNOSIS — Z23 Encounter for immunization: Secondary | ICD-10-CM

## 2015-02-18 DIAGNOSIS — E785 Hyperlipidemia, unspecified: Secondary | ICD-10-CM

## 2015-02-18 DIAGNOSIS — Z0000 Encounter for general adult medical examination without abnormal findings: Secondary | ICD-10-CM

## 2015-02-18 DIAGNOSIS — H6123 Impacted cerumen, bilateral: Secondary | ICD-10-CM | POA: Diagnosis not present

## 2015-02-18 DIAGNOSIS — N528 Other male erectile dysfunction: Secondary | ICD-10-CM | POA: Diagnosis not present

## 2015-02-18 DIAGNOSIS — I10 Essential (primary) hypertension: Secondary | ICD-10-CM

## 2015-02-18 DIAGNOSIS — J301 Allergic rhinitis due to pollen: Secondary | ICD-10-CM | POA: Diagnosis not present

## 2015-02-18 DIAGNOSIS — H269 Unspecified cataract: Secondary | ICD-10-CM | POA: Diagnosis not present

## 2015-02-18 LAB — CBC WITH DIFFERENTIAL/PLATELET
Basophils Absolute: 0 10*3/uL (ref 0.0–0.1)
Basophils Relative: 1 % (ref 0–1)
EOS PCT: 4 % (ref 0–5)
Eosinophils Absolute: 0.2 10*3/uL (ref 0.0–0.7)
HEMATOCRIT: 41 % (ref 39.0–52.0)
Hemoglobin: 13.5 g/dL (ref 13.0–17.0)
LYMPHS PCT: 44 % (ref 12–46)
Lymphs Abs: 1.9 10*3/uL (ref 0.7–4.0)
MCH: 31.8 pg (ref 26.0–34.0)
MCHC: 32.9 g/dL (ref 30.0–36.0)
MCV: 96.7 fL (ref 78.0–100.0)
MONO ABS: 0.5 10*3/uL (ref 0.1–1.0)
MPV: 11.3 fL (ref 8.6–12.4)
Monocytes Relative: 11 % (ref 3–12)
NEUTROS ABS: 1.8 10*3/uL (ref 1.7–7.7)
Neutrophils Relative %: 40 % — ABNORMAL LOW (ref 43–77)
Platelets: 190 10*3/uL (ref 150–400)
RBC: 4.24 MIL/uL (ref 4.22–5.81)
RDW: 13.7 % (ref 11.5–15.5)
WBC: 4.4 10*3/uL (ref 4.0–10.5)

## 2015-02-18 LAB — COMPREHENSIVE METABOLIC PANEL
ALK PHOS: 49 U/L (ref 40–115)
ALT: 15 U/L (ref 9–46)
AST: 14 U/L (ref 10–35)
Albumin: 4 g/dL (ref 3.6–5.1)
BUN: 12 mg/dL (ref 7–25)
CALCIUM: 9 mg/dL (ref 8.6–10.3)
CO2: 26 mmol/L (ref 20–31)
CREATININE: 0.95 mg/dL (ref 0.70–1.33)
Chloride: 105 mmol/L (ref 98–110)
GLUCOSE: 107 mg/dL — AB (ref 65–99)
Potassium: 3.9 mmol/L (ref 3.5–5.3)
SODIUM: 140 mmol/L (ref 135–146)
Total Bilirubin: 1.2 mg/dL (ref 0.2–1.2)
Total Protein: 6.7 g/dL (ref 6.1–8.1)

## 2015-02-18 LAB — LIPID PANEL
CHOL/HDL RATIO: 2.9 ratio (ref <=5.0)
Cholesterol: 135 mg/dL (ref 125–200)
HDL: 47 mg/dL (ref >=40)
LDL Cholesterol: 73 mg/dL (ref <130)
Triglycerides: 77 mg/dL (ref <150)
VLDL: 15 mg/dL (ref <30)

## 2015-02-18 MED ORDER — VARDENAFIL HCL 10 MG PO TABS: 10.0000 mg | ORAL_TABLET | Freq: Every day | ORAL | Status: DC | PRN

## 2015-02-18 MED ORDER — AMLODIPINE BESYLATE 10 MG PO TABS: ORAL_TABLET | ORAL | Status: DC

## 2015-02-18 MED ORDER — METOPROLOL SUCCINATE ER 100 MG PO TB24: 100.0000 mg | ORAL_TABLET | Freq: Every day | ORAL | Status: DC

## 2015-02-18 MED ORDER — LOSARTAN POTASSIUM-HCTZ 100-12.5 MG PO TABS: ORAL_TABLET | ORAL | Status: DC

## 2015-02-18 MED ORDER — PRAVASTATIN SODIUM 40 MG PO TABS: ORAL_TABLET | ORAL | Status: DC

## 2015-02-18 NOTE — Progress Notes (Signed)
Subjective:    [name], male    DOB: [birth year], 54 y.o.   MRN: [id number]  HPI He is here for a complete examination. He does have underlying hypertension and did run out of his medication several days ago. He also has a cataract and does see his ophthalmologist regularly. He continues on Pravachol for his hyperlipidemia. He also takes Zyrtec for his seasonal allergies. Presently he is having no difficulty with them. He would like medication for his underlying ED. Previous history indicates possible hypothyroid however recent TSH levels were normal. His health maintenance and immunization record was reviewed. Family and social history is unchanged. Work continues to go well. He has had no chest pain, shortness of breath, urinary issues.He has no other concerns or complaints.   Review of Systems  All other systems reviewed and are negative.      Objective:   Physical Exam BP 150/90 mmHg  Pulse 66  Ht 6' 0.5" (1.842 m)  Wt 253 lb (114.76 kg)  BMI 33.82 kg/m2  SpO2 99%  General Appearance:    Alert, cooperative, no distress, appears stated age  Head:    Normocephalic, without obvious abnormality, atraumatic  Eyes:    PERRL, conjunctiva/corneas clear, EOM's intact, fundi    benign  Ears:    Normal TM's and external ear canals after a large amount of cerumen was removed from both ears.  Nose:   Nares normal, mucosa normal, no drainage or sinus   tenderness  Throat:   Lips, mucosa, and tongue normal; teeth and gums normal  Neck:   Supple, no lymphadenopathy;  thyroid:  no   enlargement/tenderness/nodules; no carotid   bruit or JVD  Back:    Spine nontender, no curvature, ROM normal, no CVA     tenderness  Lungs:     Clear to auscultation bilaterally without wheezes, rales or     ronchi; respirations unlabored  Chest Wall:    No tenderness or deformity   Heart:    Regular rate and rhythm, S1 and S2 normal, no murmur, rub   or gallop  Breast Exam:    No chest wall  tenderness, masses or gynecomastia  Abdomen:     Soft, non-tender, nondistended, normoactive bowel sounds,    no masses, no hepatosplenomegaly     Rectal:    Normal sphincter tone, no masses or tenderness; guaiac negative stool.  Prostate smooth, no nodules, not enlarged.  Extremities:   No clubbing, cyanosis or edema  Pulses:   2+ and symmetric all extremities  Skin:   Skin color, texture, turgor normal, no rashes or lesions  Lymph nodes:   Cervical, supraclavicular, and axillary nodes normal  Neurologic:   CNII-XII intact, normal strength, sensation and gait; reflexes 2+ and symmetric throughout          Psych:   Normal mood, affect, hygiene and grooming.          Assessment & Plan:  Routine general medical examination at a health care facility - Plan: CBC with Differential/Platelet, Comprehensive metabolic panel, Lipid panel, PSA  Cataract  Essential hypertension - Plan: CBC with Differential/Platelet, Comprehensive metabolic panel, losartan-hydrochlorothiazide (HYZAAR) 100-12.5 MG per tablet, metoprolol succinate (TOPROL XL) 100 MG 24 hr tablet, amLODipine (NORVASC) 10 MG tablet  Hyperlipidemia with target LDL less than 100 - Plan: Lipid panel, pravastatin (PRAVACHOL) 40 MG tablet  Allergic rhinitis due to pollen  Other male erectile dysfunction - Plan: vardenafil (LEVITRA) 10 MG tablet  Need for prophylactic  vaccination and inoculation against influenza - Plan: Flu Vaccine QUAD 36+ mos IM  Cerumen impaction, bilateral I encouraged him to remain active. He will continue on his present medications. I will recheck his blood pressure in one month. Flu shot given with risks and benefits discussed.

## 2015-02-19 LAB — PSA: PSA: 0.56 ng/mL (ref <=4.00)

## 2015-02-20 LAB — HEMOGLOBIN A1C
Hgb A1c MFr Bld: 5.3 % (ref <5.7)
MEAN PLASMA GLUCOSE: 105 mg/dL (ref <117)

## 2015-02-26 ENCOUNTER — Encounter: Payer: Self-pay | Admitting: Family Medicine

## 2015-03-22 ENCOUNTER — Ambulatory Visit: Payer: BC Managed Care – PPO | Admitting: Family Medicine

## 2015-03-27 ENCOUNTER — Encounter: Payer: Self-pay | Admitting: Family Medicine

## 2015-03-30 ENCOUNTER — Emergency Department (HOSPITAL_COMMUNITY)
Admission: EM | Admit: 2015-03-30 | Discharge: 2015-03-30 | Disposition: A | Discharge status: Home / Self Care | Payer: Self-pay | Attending: Emergency Medicine | Admitting: Emergency Medicine

## 2015-03-30 ENCOUNTER — Encounter (HOSPITAL_COMMUNITY): Payer: Self-pay | Admitting: Nurse Practitioner

## 2015-03-30 ENCOUNTER — Emergency Department (EMERGENCY_DEPARTMENT_HOSPITAL): Admit: 2015-03-30 | Discharge: 2015-03-30 | Disposition: A | Discharge status: Home / Self Care | Payer: Self-pay

## 2015-03-30 ENCOUNTER — Emergency Department (HOSPITAL_COMMUNITY): Payer: Self-pay

## 2015-03-30 DIAGNOSIS — Z79899 Other long term (current) drug therapy: Secondary | ICD-10-CM | POA: Insufficient documentation

## 2015-03-30 DIAGNOSIS — M7989 Other specified soft tissue disorders: Secondary | ICD-10-CM

## 2015-03-30 DIAGNOSIS — I10 Essential (primary) hypertension: Secondary | ICD-10-CM | POA: Insufficient documentation

## 2015-03-30 DIAGNOSIS — E785 Hyperlipidemia, unspecified: Secondary | ICD-10-CM | POA: Insufficient documentation

## 2015-03-30 DIAGNOSIS — Z8719 Personal history of other diseases of the digestive system: Secondary | ICD-10-CM | POA: Insufficient documentation

## 2015-03-30 DIAGNOSIS — M25561 Pain in right knee: Secondary | ICD-10-CM | POA: Insufficient documentation

## 2015-03-30 DIAGNOSIS — Z7982 Long term (current) use of aspirin: Secondary | ICD-10-CM | POA: Insufficient documentation

## 2015-03-30 MED ORDER — ACETAMINOPHEN 500 MG PO TABS
1000.0000 mg | ORAL_TABLET | Freq: Once | ORAL | Status: AC
  Administered 2015-03-30: 1000 mg via ORAL
  Filled 2015-03-30: qty 2

## 2015-03-30 MED ORDER — TRAMADOL HCL 50 MG PO TABS: 50.0000 mg | ORAL_TABLET | Freq: Four times a day (QID) | ORAL | Status: DC | PRN

## 2015-03-30 NOTE — Progress Notes (Signed)
VASCULAR LAB PRELIMINARY  PRELIMINARY  PRELIMINARY  PRELIMINARY  Right lower extremity venous duplex completed.    Preliminary report:  There is no DVT or SVT noted in the left lower extremity.  There is, what appears to be, a Baker's cyst in the right popliteal fossa.  There is an enlarged lymph node noted in the right groin.  There are multiple varicosities noted around the knee and proximal calf that are thrombus free.   Sharanda Shinault, RVT 03/30/2015, 6:50 PM

## 2015-03-30 NOTE — ED Notes (Signed)
He c/o 1 week hx  R knee pain and swelling, worse after waking, denies any injuries. Tried tylenol with no relief

## 2015-03-30 NOTE — ED Provider Notes (Signed)
CSN: [id number]     Arrival date & time 03/30/15  1717 History   By signing my name below, I, Freida Busman, attest that this documentation has been prepared under the direction and in the presence of non-physician practitioner, Dierdre Forth, PA-C. Electronically Signed: Freida Busman, Scribe. 03/30/2015. 10:26 PM.    Chief Complaint  Patient presents with  . Knee Pain    The history is provided by the patient. No language interpreter was used.     HPI Comments:  Nader Boys is a 54 y.o. male who presents to the Emergency Department complaining of moderate right knee pain for 2 weeks with associated surrounding edema. He reports increased stiffness in the AM. Pt is unsure of injury states he may have unknowingly bumped the knee. He denies recent fall, swelling to his right foot/ankle. He also denies recent travel/surgery, long periods of immobilization and history of blood clots and h/o gout.  Denies history of issue/problems with the knee.  Continuous ambulation improves his pain.  He denies fever chills nausea and vomiting. He has taken tylenol without relief.  Denies fever, chills, numbness, tingling.  Past Medical History  Diagnosis Date  . Hypertension   . Hyperlipidemia   . Hypothyroidism   . Cardiomegaly     LVH  . Lower GI bleeding   . Hemorrhoids    Past Surgical History  Procedure Laterality Date  . Doppler echocardiography     Family History  Problem Relation Age of Onset  . Kidney disease Mother   . Hypertension Father   . Kidney disease Father   . Asthma Father    Social History  Substance Use Topics  . Smoking status: Never Smoker   . Smokeless tobacco: Never Used  . Alcohol Use: No    Review of Systems  Constitutional: Negative for fever and chills.  Gastrointestinal: Negative for nausea and vomiting.  Musculoskeletal: Positive for myalgias, joint swelling and arthralgias. Negative for back pain, neck pain and neck stiffness.  Skin: Negative  for wound.  Neurological: Negative for numbness.  Hematological: Does not bruise/bleed easily.  Psychiatric/Behavioral: The patient is not nervous/anxious.   All other systems reviewed and are negative.     Allergies  Garlic and Shrimp  Home Medications   Prior to Admission medications   Medication Sig Start Date End Date Taking? Authorizing Provider  amLODipine (NORVASC) 10 MG tablet TAKE ONE TABLET BY MOUTH ONCE DAILY 02/18/15   Ronnald Nian, MD  aspirin 81 MG tablet Take 81 mg by mouth daily.      Historical Provider, MD  cetirizine (ZYRTEC) 10 MG tablet Take 1 tablet (10 mg total) by mouth daily. 01/21/14   Morrell Riddle, PA-C  losartan-hydrochlorothiazide (HYZAAR) 100-12.5 MG per tablet TAKE ONE TABLET BY MOUTH ONCE DAILY 02/18/15   Ronnald Nian, MD  metoprolol succinate (TOPROL XL) 100 MG 24 hr tablet Take 1 tablet (100 mg total) by mouth daily. 02/18/15 02/23/16  Ronnald Nian, MD  pravastatin (PRAVACHOL) 40 MG tablet One pill per day 02/18/15   Ronnald Nian, MD  traMADol (ULTRAM) 50 MG tablet Take 1 tablet (50 mg total) by mouth every 6 (six) hours as needed. 03/30/15   Amayia Ciano, PA-C  triamcinolone cream (KENALOG) 0.5 % Apply 1 application topically 3 (three) times daily. 01/21/14   Morrell Riddle, PA-C  vardenafil (LEVITRA) 10 MG tablet Take 1 tablet (10 mg total) by mouth daily as needed for erectile dysfunction. 02/18/15   Everardo All  Susann Givens, MD   BP 154/102 mmHg  Pulse 68  Temp(Src) 98.5 F (36.9 C) (Oral)  Resp 16  SpO2 98% Physical Exam  Constitutional: He appears well-developed and well-nourished. No distress.  HENT:  Head: Normocephalic and atraumatic.  Eyes: Conjunctivae are normal.  Neck: Normal range of motion.  Cardiovascular: Normal rate, regular rhythm, normal heart sounds and intact distal pulses.   No murmur heard. Capillary refill < 3 sec  Pulmonary/Chest: Effort normal and breath sounds normal.  Musculoskeletal: He exhibits tenderness. He exhibits  no edema.  FROM: r hip knee and ankle  TTP along the med joint line of the right knee  TTP along the medial thigh popl space and calf of the right leg Prominent varicose veins of the med portion of the distal thigh and prox calf area  No ery indur or increased warmth  No peripheral edma noted to the R lower leg   Neurological: He is alert. Coordination normal.  Sensation intact throughout RLE  Strength 5/5 in RLE   Skin: Skin is warm and dry. He is not diaphoretic.  No tenting of the skin  Psychiatric: He has a normal mood and affect.  Nursing note and vitals reviewed.   ED Course  Procedures   DIAGNOSTIC STUDIES:  Oxygen Saturation is 96% on RA, normal by my interpretation.    COORDINATION OF CARE:  5:51 PM Discussed treatment plan with pt at bedside and pt agreed to plan.  7:01 PM VASCULAR LAB: Right lower extremity venous duplex completed.   Preliminary report: There is no DVT or SVT noted in the left lower extremity. There is, what appears to be, a Baker's cyst in the right popliteal fossa. There is an enlarged lymph node noted in the right groin. There are multiple varicosities noted around the knee and proximal calf that are thrombus free.   Labs Review Labs Reviewed - No data to display  Imaging Review Dg Knee Complete 4 Views Right  03/30/2015   CLINICAL DATA:  Medial pain and swelling for 2 weeks, nontraumatic.  EXAM: RIGHT KNEE - COMPLETE 4+ VIEW  COMPARISON:  None.  FINDINGS: There is no evidence of fracture, dislocation, or joint effusion. There is no evidence of arthropathy or other focal bone abnormality. Soft tissues are unremarkable.  IMPRESSION: Negative.   Electronically Signed   By: Ellery Plunk M.D.   On: 03/30/2015 18:30   I have personally reviewed and evaluated these images and lab results as part of my medical decision-making.   EKG Interpretation None      MDM   Final diagnoses:  Right knee pain   [facility] presents with  right knee pain, worse over the last 2 weeks.  No Trauma.  Pt without peripheral edema however large varicose veins noted and concern for DVT based on location of pain.  7:10PM X-ray without acute abnormality including fracture or dislocation. No significant joint effusion.  Vascular lab without evidence of DVT or SVT.  Pain controlled here in the emergency department patient will be discharged home.  Discussed close follow-up with primary care and orthopedics.  Patient ambulates without assistance here in the emergency department.  Patient noted to be hypertensive in the emergency department.  No signs of hypertensive urgency.  Discussed with patient the need for close follow-up and management by their primary care physician.    BP 154/102 mmHg  Pulse 68  Temp(Src) 98.5 F (36.9 C) (Oral)  Resp 16  SpO2 98%   I personally performed  the services described in this documentation, which was scribed in my presence. The recorded information has been reviewed and is accurate.    Dahlia Client Claudie Brickhouse, PA-C 03/30/15 2227  Laurence Spates, MD 03/31/15 [phone]

## 2015-03-30 NOTE — Discharge Instructions (Signed)
1. Medications: use tylenol for pain control, for severe pain you may take tramadol; usual home medications 2. Treatment: rest, ice, elevate and use brace, drink plenty of fluids, gentle stretching 3. Follow Up: Please followup with orthopedics as directed or your PCP in 1 week if no improvement for discussion of your diagnoses and further evaluation after today's visit; if you do not have a primary care doctor use the resource guide provided to find one; Please return to the ER for worsening symptoms or other concerns    Cryotherapy Cryotherapy means treatment with cold. Ice or gel packs can be used to reduce both pain and swelling. Ice is the most helpful within the first 24 to 48 hours after an injury or flare-up from overusing a muscle or joint. Sprains, strains, spasms, burning pain, shooting pain, and aches can all be eased with ice. Ice can also be used when recovering from surgery. Ice is effective, has very few side effects, and is safe for most people to use. PRECAUTIONS  Ice is not a safe treatment option for people with:  Raynaud phenomenon. This is a condition affecting small blood vessels in the extremities. Exposure to cold may cause your problems to return.  Cold hypersensitivity. There are many forms of cold hypersensitivity, including:  Cold urticaria. Red, itchy hives appear on the skin when the tissues begin to warm after being iced.  Cold erythema. This is a red, itchy rash caused by exposure to cold.  Cold hemoglobinuria. Red blood cells break down when the tissues begin to warm after being iced. The hemoglobin that carry oxygen are passed into the urine because they cannot combine with blood proteins fast enough.  Numbness or altered sensitivity in the area being iced. If you have any of the following conditions, do not use ice until you have discussed cryotherapy with your caregiver:  Heart conditions, such as arrhythmia, angina, or chronic heart disease.  High blood  pressure.  Healing wounds or open skin in the area being iced.  Current infections.  Rheumatoid arthritis.  Poor circulation.  Diabetes. Ice slows the blood flow in the region it is applied. This is beneficial when trying to stop inflamed tissues from spreading irritating chemicals to surrounding tissues. However, if you expose your skin to cold temperatures for too long or without the proper protection, you can damage your skin or nerves. Watch for signs of skin damage due to cold. HOME CARE INSTRUCTIONS Follow these tips to use ice and cold packs safely.  Place a dry or damp towel between the ice and skin. A damp towel will cool the skin more quickly, so you may need to shorten the time that the ice is used.  For a more rapid response, add gentle compression to the ice.  Ice for no more than 10 to 20 minutes at a time. The bonier the area you are icing, the less time it will take to get the benefits of ice.  Check your skin after 5 minutes to make sure there are no signs of a poor response to cold or skin damage.  Rest 20 minutes or more between uses.  Once your skin is numb, you can end your treatment. You can test numbness by very lightly touching your skin. The touch should be so light that you do not see the skin dimple from the pressure of your fingertip. When using ice, most people will feel these normal sensations in this order: cold, burning, aching, and numbness.  Do not  use ice on someone who cannot communicate their responses to pain, such as small children or people with dementia. HOW TO MAKE AN ICE PACK Ice packs are the most common way to use ice therapy. Other methods include ice massage, ice baths, and cryosprays. Muscle creams that cause a cold, tingly feeling do not offer the same benefits that ice offers and should not be used as a substitute unless recommended by your caregiver. To make an ice pack, do one of the following:  Place crushed ice or a bag of frozen  vegetables in a sealable plastic bag. Squeeze out the excess air. Place this bag inside another plastic bag. Slide the bag into a pillowcase or place a damp towel between your skin and the bag.  Mix 3 parts water with 1 part rubbing alcohol. Freeze the mixture in a sealable plastic bag. When you remove the mixture from the freezer, it will be slushy. Squeeze out the excess air. Place this bag inside another plastic bag. Slide the bag into a pillowcase or place a damp towel between your skin and the bag. SEEK MEDICAL CARE IF:  You develop white spots on your skin. This may give the skin a blotchy (mottled) appearance.  Your skin turns blue or pale.  Your skin becomes waxy or hard.  Your swelling gets worse. MAKE SURE YOU:   Understand these instructions.  Will watch your condition.  Will get help right away if you are not doing well or get worse.   This information is not intended to replace advice given to you by your health care provider. Make sure you discuss any questions you have with your health care provider.   Document Released: 02/02/2011 Document Revised: 06/29/2014 Document Reviewed: 02/02/2011 Elsevier Interactive Patient Education [employer]

## 2016-02-22 ENCOUNTER — Other Ambulatory Visit: Payer: Self-pay | Admitting: Family Medicine

## 2016-02-22 DIAGNOSIS — I10 Essential (primary) hypertension: Secondary | ICD-10-CM

## 2016-02-22 DIAGNOSIS — E785 Hyperlipidemia, unspecified: Secondary | ICD-10-CM

## 2016-02-25 ENCOUNTER — Telehealth: Payer: Self-pay | Admitting: Family Medicine

## 2016-02-25 NOTE — Telephone Encounter (Signed)
This has been done.

## 2016-02-25 NOTE — Telephone Encounter (Signed)
Pt called for refill of Norvasc,losartin, metoprolol and prevastatin.  No refills at the pharmacy.  Pt has cpe scheduled for October.

## 2016-04-03 ENCOUNTER — Ambulatory Visit (INDEPENDENT_AMBULATORY_CARE_PROVIDER_SITE_OTHER): Payer: BC Managed Care – PPO | Admitting: Family Medicine

## 2016-04-03 ENCOUNTER — Other Ambulatory Visit: Payer: Self-pay | Admitting: Family Medicine

## 2016-04-03 ENCOUNTER — Encounter: Payer: Self-pay | Admitting: Family Medicine

## 2016-04-03 VITALS — BP 132/80 | HR 70 | Ht 73.0 in | Wt 256.0 lb

## 2016-04-03 DIAGNOSIS — E785 Hyperlipidemia, unspecified: Secondary | ICD-10-CM

## 2016-04-03 DIAGNOSIS — Z125 Encounter for screening for malignant neoplasm of prostate: Secondary | ICD-10-CM

## 2016-04-03 DIAGNOSIS — N528 Other male erectile dysfunction: Secondary | ICD-10-CM

## 2016-04-03 DIAGNOSIS — Z23 Encounter for immunization: Secondary | ICD-10-CM | POA: Diagnosis not present

## 2016-04-03 DIAGNOSIS — Z1159 Encounter for screening for other viral diseases: Secondary | ICD-10-CM

## 2016-04-03 DIAGNOSIS — Z0000 Encounter for general adult medical examination without abnormal findings: Secondary | ICD-10-CM | POA: Diagnosis not present

## 2016-04-03 DIAGNOSIS — Z87438 Personal history of other diseases of male genital organs: Secondary | ICD-10-CM | POA: Insufficient documentation

## 2016-04-03 DIAGNOSIS — E039 Hypothyroidism, unspecified: Secondary | ICD-10-CM

## 2016-04-03 DIAGNOSIS — J301 Allergic rhinitis due to pollen: Secondary | ICD-10-CM

## 2016-04-03 DIAGNOSIS — H269 Unspecified cataract: Secondary | ICD-10-CM

## 2016-04-03 DIAGNOSIS — I10 Essential (primary) hypertension: Secondary | ICD-10-CM

## 2016-04-03 LAB — LIPID PANEL
CHOL/HDL RATIO: 3 ratio (ref <=5.0)
Cholesterol: 127 mg/dL (ref 125–200)
HDL: 43 mg/dL (ref >=40)
LDL CALC: 67 mg/dL (ref <130)
TRIGLYCERIDES: 85 mg/dL (ref <150)
VLDL: 17 mg/dL (ref <30)

## 2016-04-03 LAB — TSH: TSH: 5.1 mIU/L — ABNORMAL HIGH (ref 0.40–4.50)

## 2016-04-03 LAB — COMPREHENSIVE METABOLIC PANEL
ALBUMIN: 3.7 g/dL (ref 3.6–5.1)
ALK PHOS: 52 U/L (ref 40–115)
ALT: 12 U/L (ref 9–46)
AST: 12 U/L (ref 10–35)
BILIRUBIN TOTAL: 0.9 mg/dL (ref 0.2–1.2)
BUN: 18 mg/dL (ref 7–25)
CALCIUM: 8.8 mg/dL (ref 8.6–10.3)
CO2: 22 mmol/L (ref 20–31)
Chloride: 107 mmol/L (ref 98–110)
Creat: 1.18 mg/dL (ref 0.70–1.33)
Glucose, Bld: 107 mg/dL — ABNORMAL HIGH (ref 65–99)
POTASSIUM: 3.9 mmol/L (ref 3.5–5.3)
Sodium: 140 mmol/L (ref 135–146)
Total Protein: 6.6 g/dL (ref 6.1–8.1)

## 2016-04-03 LAB — CBC WITH DIFFERENTIAL/PLATELET
BASOS ABS: 57 {cells}/uL (ref 0–200)
Basophils Relative: 1 %
EOS ABS: 171 {cells}/uL (ref 15–500)
Eosinophils Relative: 3 %
HEMATOCRIT: 40.5 % (ref 38.5–50.0)
HEMOGLOBIN: 13.1 g/dL — AB (ref 13.2–17.1)
LYMPHS ABS: 1710 {cells}/uL (ref 850–3900)
Lymphocytes Relative: 30 %
MCH: 31.1 pg (ref 27.0–33.0)
MCHC: 32.3 g/dL (ref 32.0–36.0)
MCV: 96.2 fL (ref 80.0–100.0)
MONO ABS: 684 {cells}/uL (ref 200–950)
MPV: 11.6 fL (ref 7.5–12.5)
Monocytes Relative: 12 %
NEUTROS PCT: 54 %
Neutro Abs: 3078 cells/uL (ref 1500–7800)
Platelets: 225 10*3/uL (ref 140–400)
RBC: 4.21 MIL/uL (ref 4.20–5.80)
RDW: 13.7 % (ref 11.0–15.0)
WBC: 5.7 10*3/uL (ref 4.0–10.5)

## 2016-04-03 LAB — POCT URINALYSIS DIPSTICK
BILIRUBIN UA: NEGATIVE
GLUCOSE UA: NEGATIVE
KETONES UA: NEGATIVE
Leukocytes, UA: NEGATIVE
Nitrite, UA: NEGATIVE
Protein, UA: NEGATIVE
RBC UA: NEGATIVE
Urobilinogen, UA: NEGATIVE
pH, UA: 5.5

## 2016-04-03 LAB — PSA: PSA: 0.5 ng/mL (ref <=4.0)

## 2016-04-03 MED ORDER — VARDENAFIL HCL 10 MG PO TABS: 10.0000 mg | ORAL_TABLET | Freq: Every day | ORAL | 5 refills | Status: DC | PRN

## 2016-04-03 MED ORDER — PRAVASTATIN SODIUM 40 MG PO TABS: 40.0000 mg | ORAL_TABLET | Freq: Every day | ORAL | 3 refills | Status: DC

## 2016-04-03 MED ORDER — LOSARTAN POTASSIUM-HCTZ 100-12.5 MG PO TABS: ORAL_TABLET | ORAL | 3 refills | Status: DC

## 2016-04-03 MED ORDER — METOPROLOL SUCCINATE ER 100 MG PO TB24: 100.0000 mg | ORAL_TABLET | Freq: Every day | ORAL | 3 refills | Status: DC

## 2016-04-03 MED ORDER — AMLODIPINE BESYLATE 10 MG PO TABS: 10.0000 mg | ORAL_TABLET | Freq: Every day | ORAL | 3 refills | Status: DC

## 2016-04-03 NOTE — Progress Notes (Signed)
Subjective:    [name], male    DOB: 01/02/1961, 55 y.o.   MRN: [id number]  HPI He is here for complete examination. It has been over a year since being here. He does have an underlying history of hypothyroid but stopped taking his medicine thinking he didn't need to take it anymore. He does have hypertension and is on 3 medications for this. He is having no difficulty with that, specifically chest pain, shortness of breath, PND. He states that he has had a previous cardiac evaluation including treadmill. Stress test was reviewed and did show some abnormality as well as an EF of 32%. He also uses Levitra on an as-needed basis for his underlying GERD. He states he takes usually 1 pill per month for that. He continues on Pravachol for his hyperlipidemia and is having no aches or pains. He does have cataracts and gets yearly follow-up on that. He will be scheduling a follow-up of appointment in the near future. His allergies causing very little difficulty. He does complain of some intermittent right hand pain especially with gripping. He has a previous history of BPH but states that presently he is having no urinary symptoms. 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  Review of Systems     Objective:   Physical Exam BP 132/80   Pulse 70   Ht 6\' 1"  (1.854 m)   Wt 256 lb (116.1 kg)   BMI 33.78 kg/m   General Appearance:    Alert, cooperative, no distress, appears stated age  Head:    Normocephalic, without obvious abnormality, atraumatic  Eyes:    PERRL, conjunctiva/corneas clear, EOM's intact, fundi    benign  Ears:    Normal TM's and external ear canals  Nose:   Nares normal, mucosa normal, no drainage or sinus   tenderness  Throat:   Lips, mucosa, and tongue normal; teeth and gums normal  Neck:   Supple, no lymphadenopathy;  thyroid:  no   enlargement/tenderness/nodules; no carotid   bruit or JVD     Lungs:     Clear to auscultation  bilaterally without wheezes, rales or     ronchi; respirations unlabored      Heart:    Regular rate and rhythm, S1 and S2 normal, no murmur, rub   or gallop     Abdomen:     Soft, non-tender, nondistended, normoactive bowel sounds,    no masses, no hepatosplenomegaly  Genitalia:    Normal male external genitalia without lesions.  Testicles without masses.  No inguinal hernias.  Rectal:  Deferred   Extremities:   No clubbing, cyanosis or edema  Pulses:   2+ and symmetric all extremities  Skin:   Skin color, texture, turgor normal, no rashes or lesions  Lymph nodes:   Cervical, supraclavicular, and axillary nodes normal  Neurologic:   CNII-XII intact, normal strength, sensation and gait; reflexes 2+ and symmetric throughout          Psych:   Normal mood, affect, hygiene and grooming.    EKG does show ST-T changes however comparison to previous EKG shows very little change.      Assessment & Plan:  Need for prophylactic vaccination and inoculation against influenza - Plan: Flu Vaccine QUAD 36+ mos PF IM (Fluarix & Fluzone Quad PF)  Essential hypertension - Plan: CBC with Differential/Platelet, Comprehensive metabolic panel, losartan-hydrochlorothiazide (HYZAAR) 100-12.5 MG tablet, metoprolol succinate (TOPROL-XL) 100 MG 24 hr tablet, amLODipine (NORVASC)  10 MG tablet, EKG 12-Lead, Ambulatory referral to Cardiology  Hyperlipidemia with target LDL less than 100 - Plan: Lipid panel, pravastatin (PRAVACHOL) 40 MG tablet  Other male erectile dysfunction - Plan: vardenafil (LEVITRA) 10 MG tablet  Need for hepatitis C screening test - Plan: Hepatitis C antibody  Screening for prostate cancer - Plan: PSA  Routine general medical examination at a health care facility - Plan: POCT Urinalysis Dipstick, CBC with Differential/Platelet, Comprehensive metabolic panel, Lipid panel  Hypothyroidism, unspecified type - Plan: TSH  Cataract, unspecified cataract type, unspecified laterality  Allergic  rhinitis due to pollen, unspecified chronicity, unspecified seasonality  History of BPH Since he hasn't had a cardiology eval since 2010 especially with his EF of 32, further cardiac evaluation is reasonable. He will continue on his present medication regimen. I will follow-up on his thyroid since he has stopped taking his medication. His medications were reviewed. He will continue to be followed by ophthalmology. Discussed the hand pain briefly with him and since it's causing very little difficulty, no intervention needed. He could be having an early Dupuytren's contracture.

## 2016-04-04 LAB — HEPATITIS C ANTIBODY: HCV Ab: NEGATIVE

## 2016-04-06 LAB — HEMOGLOBIN A1C
Hgb A1c MFr Bld: 5 % (ref <5.7)
Mean Plasma Glucose: 97 mg/dL

## 2016-04-06 LAB — THYROID PEROXIDASE ANTIBODY: Thyroperoxidase Ab SerPl-aCnc: <1 IU/mL (ref <9)

## 2016-04-08 ENCOUNTER — Ambulatory Visit: Payer: Self-pay | Admitting: Cardiology

## 2016-04-19 NOTE — Progress Notes (Signed)
Cardiology Office Note   Date:  04/20/2016   ID:  Martin [name], DOB 07/09/1960, MRN [id number]  PCP:  Carollee HerterLALONDE,JOHN CHARLES, MD  Cardiologist:   Rollene RotundaJames Meher Kucinski, MD  Referring:  Carollee HerterLALONDE,JOHN CHARLES, MD  Chief Complaint  Patient presents with  . Hypertension      History of Present Illness: Martin [name] is a 55 y.o. male who presents for evaluation of HTN.  He has a history of hypertension and has had LVH on EKG and apparently on echocardiogram. I see this mentioned but I don't have the actual report. It was done perhaps 9 years ago. He doesn't report any cardiac symptoms otherwise. He is active working for [employer]. He denies any cardiovascular symptoms. The patient denies any new symptoms such as chest discomfort, neck or arm discomfort. There has been no new shortness of breath, PND or orthopnea. There have been no reported palpitations, presyncope or syncope.   Past Medical History:  Diagnosis Date  . Cardiomegaly    LVH  . Cataract   . Hemorrhoids   . Hyperlipidemia   . Hypertension   . Hypothyroidism   . Lower GI bleeding     Past Surgical History:  Procedure Laterality Date  . DOPPLER ECHOCARDIOGRAPHY       Current Outpatient Prescriptions  Medication Sig Dispense Refill  . amLODipine (NORVASC) 10 MG tablet Take 1 tablet (10 mg total) by mouth daily. 90 tablet 3  . aspirin 81 MG tablet Take 81 mg by mouth daily.      . cetirizine (ZYRTEC) 10 MG tablet Take 1 tablet (10 mg total) by mouth daily. 30 tablet 0  . metoprolol succinate (TOPROL-XL) 100 MG 24 hr tablet Take 1 tablet (100 mg total) by mouth daily. Take with or immediately following a meal. 90 tablet 3  . pravastatin (PRAVACHOL) 40 MG tablet Take 1 tablet (40 mg total) by mouth daily. 90 tablet 3  . traMADol (ULTRAM) 50 MG tablet Take 50 mg by mouth every 6 (six) hours as needed for moderate pain.    [name]. triamcinolone cream (KENALOG) 0.5 % Apply 1 application topically 3 (three)  times daily. 30 g 0  . vardenafil (LEVITRA) 10 MG tablet Take 1 tablet (10 mg total) by mouth daily as needed for erectile dysfunction. 10 tablet 5  . losartan-hydrochlorothiazide (HYZAAR) 100-25 MG tablet Take 1 tablet by mouth daily. 30 tablet 5   No current facility-administered medications for this visit.     Allergies:   Garlic and Shrimp [shellfish allergy]    Social History:  The patient  reports that he has never smoked. He has never used smokeless tobacco. He reports that he drinks alcohol. He reports that he does not use drugs.   Family History:  The patient's family history includes Asthma in his father; Hypertension in his father; Kidney disease in his father and mother.    ROS:  Please see the history of present illness.   Otherwise, review of systems are positive for none.   All other systems are reviewed and negative.    PHYSICAL EXAM: VS:  BP (!) 150/110 (BP Location: Right Arm)   Pulse 70   Ht 6' (1.829 m)   Wt 257 lb 12.8 oz (116.9 kg)   BMI 34.96 kg/m  , BMI Body mass index is 34.96 kg/m. GENERAL:  Well appearing HEENT:  Pupils equal round and reactive, fundi not visualized, oral mucosa unremarkable NECK:  No jugular venous distention, waveform within normal limits, carotid  upstroke brisk and symmetric, no bruits, no thyromegaly LYMPHATICS:  No cervical, inguinal adenopathy LUNGS:  Clear to auscultation bilaterally BACK:  No CVA tenderness CHEST:  Unremarkable HEART:  PMI not displaced or sustained,S1 and S2 within normal limits, no S3, no S4, no clicks, no rubs, no murmurs ABD:  Flat, positive bowel sounds normal in frequency in pitch, no bruits, no rebound, no guarding, no midline pulsatile mass, no hepatomegaly, no splenomegaly EXT:  2 plus pulses throughout, no edema, no cyanosis no clubbing SKIN:  No rashes no nodules NEURO:  Cranial nerves II through XII grossly intact, motor grossly intact throughout PSYCH:  Cognitively intact, oriented to person place  and time    EKG:  EKG is not ordered today. The ekg ordered 10/13/17demonstrates sinus rhythm, rate 64, axis within normal limits, intervals within normal limits, nonspecific inferior T-wave inversions.   Recent Labs: 04/03/2016: ALT 12; BUN 18; Creat 1.18; Hemoglobin 13.1; Platelets 225; Potassium 3.9; Sodium 140; TSH 5.10    Lipid Panel    Component Value Date/Time   CHOL 127 04/03/2016 0754   TRIG 85 04/03/2016 0754   HDL 43 04/03/2016 0754   CHOLHDL 3.0 04/03/2016 0754   VLDL 17 04/03/2016 0754   LDLCALC 67 04/03/2016 0754      Wt Readings from Last 3 Encounters:  04/20/16 257 lb 12.8 oz (116.9 kg)  04/03/16 256 lb (116.1 kg)  02/18/15 253 lb (114.8 kg)      Other studies Reviewed: Additional studies/ records that were reviewed today include: Office records. Review of the above records demonstrates:  Please see elsewhere in the note.     ASSESSMENT AND PLAN:  HTN:  His blood pressure at home is close to target by his report. It is slightly elevated today and has been a previous readings. I'm going to start by increasing his Hyzaar to 100/25.  If he needs further blood pressure control. Consider adding spironolactone.  However, I have also prescribed lower salt and a 10 pound weight loss.[name].  LVH:  I suspect that this is mild by exam. Blood pressure control is key and I don't think further testing is indicated.  Current medicines are reviewed at length with the patient today.  The patient does not have concerns regarding medicines.  The following changes have been made:  no change  Labs/ tests ordered today include: None No orders of the defined types were placed in this encounter.    Disposition:   FU with me as needed.     Signed, Rollene RotundaJames Elchanan Bob, MD  04/20/2016 10:44 AM    [facility]

## 2016-04-20 ENCOUNTER — Ambulatory Visit (INDEPENDENT_AMBULATORY_CARE_PROVIDER_SITE_OTHER): Payer: BC Managed Care – PPO | Admitting: Cardiology

## 2016-04-20 ENCOUNTER — Encounter: Payer: Self-pay | Admitting: Cardiology

## 2016-04-20 DIAGNOSIS — I10 Essential (primary) hypertension: Secondary | ICD-10-CM | POA: Diagnosis not present

## 2016-04-20 MED ORDER — LOSARTAN POTASSIUM-HCTZ 100-25 MG PO TABS: 1.0000 | ORAL_TABLET | Freq: Every day | ORAL | 5 refills | Status: DC

## 2016-04-20 NOTE — Patient Instructions (Addendum)
Medication Instructions:  START LOSARTAN HCTZ 100-25 MG DAILY   STOP LOSARTAN HCTZ 100-12.5 MG   Labwork: NONE  Testing/Procedures: NONE  Follow-Up:  AS NEEDED  Any Other Special Instructions Will Be Listed Below (If Applicable) DECREASE YOUR SALT INTAKE  TRY TO LOSE 10 POUNDS

## 2016-07-06 ENCOUNTER — Telehealth: Payer: Self-pay

## 2016-07-06 DIAGNOSIS — N528 Other male erectile dysfunction: Secondary | ICD-10-CM

## 2016-07-06 MED ORDER — SILDENAFIL CITRATE 20 MG PO TABS: ORAL_TABLET | ORAL | 5 refills | Status: DC

## 2016-07-06 NOTE — Addendum Note (Signed)
Addended by: [name], Chloey Ricard C on: 07/06/2016 04:27 PM   Modules accepted: Orders

## 2016-07-06 NOTE — Telephone Encounter (Signed)
Pt would like levitra changed to [facility]. He wonders if he just needs the low dose. He would like a call back at [phone]. Trixie Rude/RLB

## 2016-09-14 ENCOUNTER — Emergency Department (HOSPITAL_COMMUNITY)
Admission: EM | Admit: 2016-09-14 | Discharge: 2016-09-14 | Disposition: A | Discharge status: Home / Self Care | Payer: BC Managed Care – PPO | Attending: Emergency Medicine | Admitting: Emergency Medicine

## 2016-09-14 ENCOUNTER — Encounter (HOSPITAL_COMMUNITY): Payer: Self-pay

## 2016-09-14 ENCOUNTER — Emergency Department (HOSPITAL_COMMUNITY): Payer: BC Managed Care – PPO

## 2016-09-14 DIAGNOSIS — J40 Bronchitis, not specified as acute or chronic: Secondary | ICD-10-CM | POA: Insufficient documentation

## 2016-09-14 DIAGNOSIS — E039 Hypothyroidism, unspecified: Secondary | ICD-10-CM | POA: Insufficient documentation

## 2016-09-14 DIAGNOSIS — Z7982 Long term (current) use of aspirin: Secondary | ICD-10-CM | POA: Diagnosis not present

## 2016-09-14 DIAGNOSIS — I10 Essential (primary) hypertension: Secondary | ICD-10-CM | POA: Diagnosis not present

## 2016-09-14 DIAGNOSIS — R05 Cough: Secondary | ICD-10-CM | POA: Diagnosis present

## 2016-09-14 MED ORDER — BENZONATATE 100 MG PO CAPS: 100.0000 mg | ORAL_CAPSULE | Freq: Three times a day (TID) | ORAL | 0 refills | Status: DC

## 2016-09-14 MED ORDER — ALBUTEROL SULFATE HFA 108 (90 BASE) MCG/ACT IN AERS
2.0000 | INHALATION_SPRAY | Freq: Once | RESPIRATORY_TRACT | Status: AC
  Administered 2016-09-14: 2 via RESPIRATORY_TRACT
  Filled 2016-09-14: qty 6.7

## 2016-09-14 MED ORDER — CETIRIZINE HCL 10 MG PO TABS: 10.0000 mg | ORAL_TABLET | Freq: Every day | ORAL | 0 refills | Status: DC

## 2016-09-14 MED ORDER — PREDNISONE 20 MG PO TABS
60.0000 mg | ORAL_TABLET | Freq: Once | ORAL | Status: AC
  Administered 2016-09-14: 60 mg via ORAL
  Filled 2016-09-14: qty 3

## 2016-09-14 MED ORDER — PREDNISONE 20 MG PO TABS: 40.0000 mg | ORAL_TABLET | Freq: Every day | ORAL | 0 refills | Status: DC

## 2016-09-14 MED ORDER — AEROCHAMBER PLUS FLO-VU MEDIUM MISC
1.0000 | Freq: Once | Status: AC
  Administered 2016-09-14: 1
  Filled 2016-09-14: qty 1

## 2016-09-14 MED ORDER — IPRATROPIUM-ALBUTEROL 0.5-2.5 (3) MG/3ML IN SOLN
3.0000 mL | Freq: Once | RESPIRATORY_TRACT | Status: AC
  Administered 2016-09-14: 3 mL via RESPIRATORY_TRACT
  Filled 2016-09-14: qty 3

## 2016-09-14 NOTE — ED Provider Notes (Signed)
[facility]EMERGENCY DEPT Provider Note   CSN: [id number] Arrival date & time: 09/14/16  0907  By signing my name below, I, Martin [name], attest that this documentation has been prepared under the direction and in the presence of [name], PA-C. Electronically Signed: Marnette Burgess [name], Scribe. 09/14/2016. 10:24 AM.  History   Chief Complaint No chief complaint on file.  The history is provided by the patient and medical records. No language interpreter was used.   HPI Comments:  Martin [name] is an obese 56 y.o. male with a PMHx of HTN, Cardiomegaly, HLD, and Hypothyroidism, who presents to the Emergency Department complaining of persistent, productive cough with green phlegm onset two weeks ago. He states the cough began two weeks ago and "will not go away". He notes a h/o bronchitis and seasonal allergies that usually present around this time of year. Pt has associated symptoms of rhinorrhea, sneezing, wheezing, and mild SOB. He has tried Mucinex, Theraflu, and Nyquil at home with no relief of his symptoms. No h/o significant coronary events or cardiovascular disease. Pt denies sore throat, fever, and any other complaints at this time.  Past Medical History:  Diagnosis Date  . Cardiomegaly    LVH  . Cataract   . Hemorrhoids   . Hyperlipidemia   . Hypertension   . Hypothyroidism   . Lower GI bleeding     Patient Active Problem List   Diagnosis Date Noted  . History of BPH 04/03/2016  . Allergic rhinitis due to pollen 02/18/2015  . Other male erectile dysfunction 02/18/2015  . Cataract 02/15/2014  . Hemorrhoids 02/10/2013  . Hypertension 03/17/2011  . Hyperlipidemia with target LDL less than 100 03/17/2011  . LVH (left ventricular hypertrophy) due to hypertensive disease 03/17/2011  . Hypothyroid 03/17/2011    Past Surgical History:  Procedure Laterality Date  . DOPPLER ECHOCARDIOGRAPHY      Home Medications    Prior to Admission medications   Medication  Sig Start Date End Date Taking? Authorizing Provider  amLODipine (NORVASC) 10 MG tablet Take 1 tablet (10 mg total) by mouth daily. 04/03/16   Ronnald Nian, MD  aspirin 81 MG tablet Take 81 mg by mouth daily.      Historical Provider, MD  cetirizine (ZYRTEC) 10 MG tablet Take 1 tablet (10 mg total) by mouth daily. 01/21/14   Morrell Riddle, PA-C  losartan-hydrochlorothiazide (HYZAAR) 100-25 MG tablet Take 1 tablet by mouth daily. 04/20/16   Rollene Rotunda, MD  metoprolol succinate (TOPROL-XL) 100 MG 24 hr tablet Take 1 tablet (100 mg total) by mouth daily. Take with or immediately following a meal. 04/03/16   Ronnald Nian, MD  pravastatin (PRAVACHOL) 40 MG tablet Take 1 tablet (40 mg total) by mouth daily. 04/03/16   Ronnald Nian, MD  sildenafil (REVATIO) 20 MG tablet Take 1 to 5 pills as needed 07/06/16   Ronnald Nian, MD  traMADol (ULTRAM) 50 MG tablet Take 50 mg by mouth every 6 (six) hours as needed for moderate pain.    Historical Provider, MD  triamcinolone cream (KENALOG) 0.5 % Apply 1 application topically 3 (three) times daily. 01/21/14   Morrell Riddle, PA-C    Family History Family History  Problem Relation Age of Onset  . Kidney disease Mother   . Hypertension Father   . Kidney disease Father   . Asthma Father     Social History Social History  Substance Use Topics  . Smoking status: Never Smoker  .  Smokeless tobacco: Never Used  . Alcohol use Yes     Comment: Rare     Allergies   Garlic and Shrimp [shellfish allergy]   Review of Systems Review of Systems  Constitutional: Negative for chills and fever.  HENT: Positive for rhinorrhea and sneezing. Negative for sore throat.   Respiratory: Positive for cough, shortness of breath (mild) and wheezing.   Cardiovascular: Negative for chest pain.  Neurological: Negative for headaches.  All other systems reviewed and are negative.    Physical Exam Updated Vital Signs BP (!) 153/99 (BP Location: Right Arm)   Pulse  74   Temp 98.1 F (36.7 C) (Oral)   Resp 18   Ht 6\' 1"  (1.854 m)   Wt 235 lb (106.6 kg)   SpO2 98%   BMI 31.00 kg/m   Physical Exam  Constitutional: He is oriented to person, place, and time. He appears well-developed and well-nourished.  HENT:  Head: Normocephalic.  Eyes: Conjunctivae are normal.  Neck: Normal range of motion. Neck supple.  Cardiovascular: Normal rate, regular rhythm and normal heart sounds.   Pulmonary/Chest: Effort normal. He has wheezes.  Inspiratory and expiratory wheezes bilaterally. No respiratory distress. Speaking in full sentences.  Abdominal: He exhibits no distension.  Musculoskeletal: Normal range of motion.  Neurological: He is alert and oriented to person, place, and time.  Skin: Skin is warm and dry.  Psychiatric: He has a normal mood and affect.  Nursing note and vitals reviewed.    ED Treatments / Results  DIAGNOSTIC STUDIES:  Oxygen Saturation is 98% on RA, normal by my interpretation.    COORDINATION OF CARE:  10:23 AM Discussed treatment plan with pt at bedside including CXR, breathing Tx, and steroids and pt agreed to plan.  Labs (all labs ordered are listed, but only abnormal results are displayed) Labs Reviewed - No data to display  EKG  EKG Interpretation None       Radiology Dg Chest 2 View  Result Date: 09/14/2016 CLINICAL DATA:  Cough, wheezing for 2 weeks EXAM: CHEST  2 VIEW COMPARISON:  None. FINDINGS: Heart and mediastinal contours are within normal limits. No focal opacities or effusions. No acute bony abnormality. IMPRESSION: No active cardiopulmonary disease. Electronically Signed   By: Charlett Nose M.D.   On: 09/14/2016 10:50    Procedures Procedures (including critical care time)  Medications Ordered in ED Medications - No data to display   Initial Impression / Assessment and Plan / ED Course  I have reviewed the triage vital signs and the nursing notes.  Pertinent labs & imaging results that were  available during my care of the patient were reviewed by me and considered in my medical decision making (see chart for details).     Patient in emergency department with persistent cough for 2 weeks, productive of green sputum. Patient is wheezing in emergency department. Vital signs are normal. Given a breathing treatment, chest x-ray is negative, given a dose of prednisone. Patient feels much better after breathing treatment and wheezing improved. Plan to discharge home with inhaler, AeroChamber provided, prednisone burst, Tessalon for cough. Most likely acute bronchitis. Instructed to follow up closely with primary care doctor. Return precautions discussed.  Vitals:   09/14/16 0916 09/14/16 1154  BP: (!) 153/99 (!) 142/95  Pulse: 74 75  Resp: 18 18  Temp: 98.1 F (36.7 C) 98 F (36.7 C)  TempSrc: Oral Oral  SpO2: 98% 100%  Weight: 106.6 kg   Height: 6\' 1"  (1.854  m)      Final Clinical Impressions(s) / ED Diagnoses   Final diagnoses:  Bronchitis    New Prescriptions Discharge Medication List as of 09/14/2016 11:53 AM    START taking these medications   Details  benzonatate (TESSALON) 100 MG capsule Take 1 capsule (100 mg total) by mouth every 8 (eight) hours., Starting Mon 09/14/2016, Print    predniSONE (DELTASONE) 20 MG tablet Take 2 tablets (40 mg total) by mouth daily., Starting Mon 09/14/2016, Print       I personally performed the services described in this documentation, which was scribed in my presence. The recorded information has been reviewed and is accurate.    Jaynie Crumbleatyana Jamala Kohen, PA-C 09/14/16 1707    Shaune Pollackameron Isaacs, MD 09/14/16 2137

## 2016-09-14 NOTE — Discharge Instructions (Signed)
Inhaler 2 puffs every 4 hrs. Prednisone as prescribed until all gone. Tessalon for cough. Follow up with family doctor. Return if worsening.

## 2016-09-14 NOTE — ED Triage Notes (Signed)
Patient complains of dry cough x 2 weeks, states that the cough is worse at night. Has tried mucinex, nyquil with no relief. NAD

## 2016-09-14 NOTE — ED Notes (Signed)
Declined W/C at D/C and was escorted to lobby by RN. 

## 2016-10-24 ENCOUNTER — Emergency Department (HOSPITAL_COMMUNITY)
Admission: EM | Admit: 2016-10-24 | Discharge: 2016-10-24 | Disposition: A | Discharge status: Home / Self Care | Payer: BC Managed Care – PPO | Attending: Emergency Medicine | Admitting: Emergency Medicine

## 2016-10-24 ENCOUNTER — Encounter (HOSPITAL_COMMUNITY): Payer: Self-pay | Admitting: Emergency Medicine

## 2016-10-24 ENCOUNTER — Emergency Department (HOSPITAL_COMMUNITY): Payer: BC Managed Care – PPO

## 2016-10-24 DIAGNOSIS — Z79899 Other long term (current) drug therapy: Secondary | ICD-10-CM | POA: Diagnosis not present

## 2016-10-24 DIAGNOSIS — J0100 Acute maxillary sinusitis, unspecified: Secondary | ICD-10-CM

## 2016-10-24 DIAGNOSIS — E039 Hypothyroidism, unspecified: Secondary | ICD-10-CM | POA: Insufficient documentation

## 2016-10-24 DIAGNOSIS — Z7982 Long term (current) use of aspirin: Secondary | ICD-10-CM | POA: Insufficient documentation

## 2016-10-24 DIAGNOSIS — J40 Bronchitis, not specified as acute or chronic: Secondary | ICD-10-CM | POA: Diagnosis not present

## 2016-10-24 DIAGNOSIS — I10 Essential (primary) hypertension: Secondary | ICD-10-CM | POA: Insufficient documentation

## 2016-10-24 DIAGNOSIS — R0981 Nasal congestion: Secondary | ICD-10-CM | POA: Diagnosis present

## 2016-10-24 MED ORDER — IPRATROPIUM-ALBUTEROL 0.5-2.5 (3) MG/3ML IN SOLN
3.0000 mL | Freq: Once | RESPIRATORY_TRACT | Status: AC
  Administered 2016-10-24: 3 mL via RESPIRATORY_TRACT
  Filled 2016-10-24: qty 3

## 2016-10-24 MED ORDER — AZITHROMYCIN 250 MG PO TABS: ORAL_TABLET | ORAL | 0 refills | Status: DC

## 2016-10-24 MED ORDER — PREDNISONE 20 MG PO TABS
40.0000 mg | ORAL_TABLET | Freq: Once | ORAL | Status: AC
  Administered 2016-10-24: 40 mg via ORAL
  Filled 2016-10-24: qty 2

## 2016-10-24 MED ORDER — ALBUTEROL SULFATE HFA 108 (90 BASE) MCG/ACT IN AERS
1.0000 | INHALATION_SPRAY | Freq: Once | RESPIRATORY_TRACT | Status: AC
  Administered 2016-10-24: 2 via RESPIRATORY_TRACT
  Filled 2016-10-24: qty 6.7

## 2016-10-24 MED ORDER — AEROCHAMBER PLUS FLO-VU LARGE MISC
1.0000 | Freq: Once | Status: AC
  Administered 2016-10-24: 1

## 2016-10-24 MED ORDER — PREDNISONE 10 MG PO TABS: 20.0000 mg | ORAL_TABLET | Freq: Every day | ORAL | 0 refills | Status: AC

## 2016-10-24 NOTE — ED Notes (Signed)
Pt returned from XRAY 

## 2016-10-24 NOTE — ED Provider Notes (Signed)
[facility]Y [facility]PT Provider Note    By signing my name below, I, Doreatha Martin, attest that this documentation has been prepared under the direction and in the presence of  [facility], PA-C. Electronically Signed: Doreatha Martin, ED Scribe. 10/24/16. 9:51 AM.    History   Chief Complaint Chief Complaint  Patient presents with  . Nasal Congestion   The history is provided by the patient and medical records. No language interpreter was used.    HPI Comments: Martin [name] is a 56 y.o. male who presents to the Emergency Department complaining of progressively worsening nasal congestion x 7 days with associated left-sided sinus pressure/aching, productive cough with green phlegm, SOB. Pt was seen in 09/14/16 for similar symptoms, had negative CXR and was sent home with Tessalon, an inhaler and a prednisone burst. Per pt, those symptoms resolved before returning and worsening last week. Pt notes his current symptoms are consistent with prior URIs. He has tried Zyrtec with mild relief of congestion. Pt is tolerating food and fluids. No h/o COPD, asthma. Pt is a non-smoker. He denies CP, fever, chills, abdominal pain, nausea, vomiting, diarrhea, sore throat.     Past Medical History:  Diagnosis Date  . Cardiomegaly    LVH  . Cataract   . Hemorrhoids   . Hyperlipidemia   . Hypertension   . Hypothyroidism   . Lower GI bleeding     Patient Active Problem List   Diagnosis Date Noted  . History of BPH 04/03/2016  . Allergic rhinitis due to pollen 02/18/2015  . Other male erectile dysfunction 02/18/2015  . Cataract 02/15/2014  . Hemorrhoids 02/10/2013  . Hypertension 03/17/2011  . Hyperlipidemia with target LDL less than 100 03/17/2011  . LVH (left ventricular hypertrophy) due to hypertensive disease 03/17/2011  . Hypothyroid 03/17/2011    Past Surgical History:  Procedure Laterality Date  . DOPPLER ECHOCARDIOGRAPHY         Home Medications    Prior to Admission medications    Medication Sig Start Date End Date Taking? Authorizing Provider  amLODipine (NORVASC) 10 MG tablet Take 1 tablet (10 mg total) by mouth daily. 04/03/16   Ronnald Nian, MD  aspirin 81 MG tablet Take 81 mg by mouth daily.      [provider]  azithromycin (ZITHROMAX Z-PAK) 250 MG tablet Take 2 tabs today and 1 tab for the next 4 days 10/24/16   Michela Pitcher A, PA-C  benzonatate (TESSALON) 100 MG capsule Take 1 capsule (100 mg total) by mouth every 8 (eight) hours. 09/14/16   Kirichenko, Lemont Fillers, PA-C  cetirizine (ZYRTEC ALLERGY) 10 MG tablet Take 1 tablet (10 mg total) by mouth daily. 09/14/16   Kirichenko, Tatyana, PA-C  losartan-hydrochlorothiazide (HYZAAR) 100-25 MG tablet Take 1 tablet by mouth daily. 04/20/16   Rollene Rotunda, MD  metoprolol succinate (TOPROL-XL) 100 MG 24 hr tablet Take 1 tablet (100 mg total) by mouth daily. Take with or immediately following a meal. 04/03/16   Ronnald Nian, MD  pravastatin (PRAVACHOL) 40 MG tablet Take 1 tablet (40 mg total) by mouth daily. 04/03/16   Ronnald Nian, MD  predniSONE (DELTASONE) 10 MG tablet Take 2 tablets (20 mg total) by mouth daily. 10/24/16 10/29/16  Michela Pitcher A, PA-C  sildenafil (REVATIO) 20 MG tablet Take 1 to 5 pills as needed 07/06/16   Ronnald Nian, MD  traMADol (ULTRAM) 50 MG tablet Take 50 mg by mouth every 6 (six) hours as needed for moderate pain.  [provider]  triamcinolone cream (KENALOG) 0.5 % Apply 1 application topically 3 (three) times daily. 01/21/14   Morrell RiddleWeber, Sarah L, PA-C    Family History Family History  Problem Relation Age of Onset  . Kidney disease Mother   . Hypertension Father   . Kidney disease Father   . Asthma Father     Social History Social History  Substance Use Topics  . Smoking status: Never Smoker  . Smokeless tobacco: Never Used  . Alcohol use Yes     Comment: Rare     Allergies   Garlic and Shrimp [shellfish allergy]   Review of Systems Review of Systems    Constitutional: Negative for chills and fever.  HENT: Positive for congestion, sinus pain and sinus pressure. Negative for sore throat.   Respiratory: Positive for cough and shortness of breath.   Cardiovascular: Negative for chest pain.  Gastrointestinal: Negative for abdominal pain, diarrhea, nausea and vomiting.     Physical Exam Updated Vital Signs BP (!) 145/96   Pulse 89   Resp 16   SpO2 98%   Physical Exam  Constitutional: He is oriented to person, place, and time. He appears well-developed and well-nourished.  HENT:  Head: Normocephalic and atraumatic.  Right Ear: External ear normal.  Left Ear: External ear normal.  Mouth/Throat: Oropharynx is clear and moist.  Left maxillary sinus TTP. No frontal sinus TTP. TMs normal bilaterally. Nasal septum midline with pink nasal mucosa and clear drainage. Posterior oropharynx without exudate, erythema, hypertrophy, uvular deviation.   Eyes: Conjunctivae are normal. Pupils are equal, round, and reactive to light. Right eye exhibits no discharge. Left eye exhibits no discharge. No scleral icterus.  Neck: Normal range of motion. No JVD present. No tracheal deviation present.  Cardiovascular: Normal rate, regular rhythm, normal heart sounds and intact distal pulses.   Pulmonary/Chest: Effort normal. He has wheezes. He exhibits no tenderness.  Diffuse expiratory wheezing   Abdominal: Soft. Bowel sounds are normal. He exhibits no distension. There is no tenderness.  Musculoskeletal: Normal range of motion.  Lymphadenopathy:    He has no cervical adenopathy.  Neurological: He is alert and oriented to person, place, and time.  Skin: Skin is warm and dry.  Psychiatric: He has a normal mood and affect. His behavior is normal.  Nursing note and vitals reviewed.    ED Treatments / Results   COORDINATION OF CARE: 9:40 AM- Will order CXR, breathing tx. Pt verbalizes understanding and agrees to plan.  Medications  ipratropium-albuterol  (DUONEB) 0.5-2.5 (3) MG/3ML nebulizer solution 3 mL (3 mLs Nebulization Given 10/24/16 1023)  ipratropium-albuterol (DUONEB) 0.5-2.5 (3) MG/3ML nebulizer solution 3 mL (3 mLs Nebulization Given 10/24/16 1117)  predniSONE (DELTASONE) tablet 40 mg (40 mg Oral Given 10/24/16 1239)  albuterol (PROVENTIL HFA;VENTOLIN HFA) 108 (90 Base) MCG/ACT inhaler 1-2 puff (2 puffs Inhalation Given 10/24/16 1240)  AEROCHAMBER PLUS FLO-VU LARGE MISC 1 each (1 each Other Given 10/24/16 1241)    Radiology Dg Chest 2 View  Result Date: 10/24/2016 CLINICAL DATA:  Wheezing, shortness of breath and productive cough. EXAM: CHEST  2 VIEW COMPARISON:  09/14/2016 FINDINGS: The heart size is stable and within normal limits. There is stable tortuosity of the thoracic aorta. There is no evidence of pulmonary edema, consolidation, pneumothorax, nodule or pleural fluid. Stable degenerative disease of the thoracic spine. IMPRESSION: No active cardiopulmonary disease. Electronically Signed   By: Irish LackGlenn  Yamagata M.D.   On: 10/24/2016 10:09    Procedures Procedures (including critical  care time)  Medications Ordered in ED Medications  ipratropium-albuterol (DUONEB) 0.5-2.5 (3) MG/3ML nebulizer solution 3 mL (3 mLs Nebulization Given 10/24/16 1023)  ipratropium-albuterol (DUONEB) 0.5-2.5 (3) MG/3ML nebulizer solution 3 mL (3 mLs Nebulization Given 10/24/16 1117)  predniSONE (DELTASONE) tablet 40 mg (40 mg Oral Given 10/24/16 1239)  albuterol (PROVENTIL HFA;VENTOLIN HFA) 108 (90 Base) MCG/ACT inhaler 1-2 puff (2 puffs Inhalation Given 10/24/16 1240)  AEROCHAMBER PLUS FLO-VU LARGE MISC 1 each (1 each Other Given 10/24/16 1241)     Initial Impression / Assessment and Plan / ED Course  I have reviewed the triage vital signs and the nursing notes.  Pertinent imaging results that were available during my care of the patient were reviewed by me and considered in my medical decision making (see chart for details).     Patient with sinus pressure,  nasal congestion, shortness of breath. Afebrile, blood pressure at patient's baseline, SpO2 98% RA. Diffuse expiratory wheezing, 2 duonebs and steroid pulse given with some improvement. Ambulatory pulse ox with oxygen saturation greater than 95% on room air, although patient is visibly short of breath. Chest x-ray is negative for signs of acute cardiopulmonary disease. Low suspicion of pneumonia or pulmonary edema or PE. Pt symptoms consistent with bronchitis and superimposed sinusitis that has been going on for several days. Pt will be discharged with steroid pulse x 5 days, azithromycin for sinusitis, and albuterol inhaler with education as to how to use the AeroChamber. Patient will follow up with primary care if symptoms persist.  Discussed return precautions.  Pt is hemodynamically stable & in NAD prior to discharge. Patient seen and evaluated by Dr. Donnald Garre.   Final Clinical Impressions(s) / ED Diagnoses   Final diagnoses:  Bronchitis  Acute non-recurrent maxillary sinusitis    New Prescriptions Discharge Medication List as of 10/24/2016 12:28 PM    START taking these medications   Details  azithromycin (ZITHROMAX Z-PAK) 250 MG tablet Take 2 tabs today and 1 tab for the next 4 days, Print       I personally performed the services described in this documentation, which was scribed in my presence. The recorded information has been reviewed and is accurate.     Jeanie Sewer, PA-C 10/24/16 1457    Jeanie Sewer, PA-C 10/24/16 1458    Arby Barrette, MD 10/25/16 [phone]

## 2016-10-24 NOTE — ED Notes (Signed)
Patient transported to X-ray 

## 2016-10-24 NOTE — ED Triage Notes (Signed)
Pt states he was given medicines for congestion and he ran out and still feels congested.

## 2016-10-24 NOTE — ED Notes (Signed)
Last chest xray 3/26 with no significant showing at that time. Pt continues with expiratory wheezes, mild sob and coughing up green "slime". States he has run out of all the meds received at discharge, symptoms persist.

## 2016-10-29 ENCOUNTER — Ambulatory Visit (INDEPENDENT_AMBULATORY_CARE_PROVIDER_SITE_OTHER): Payer: BC Managed Care – PPO | Admitting: Family Medicine

## 2016-10-29 ENCOUNTER — Encounter: Payer: Self-pay | Admitting: Family Medicine

## 2016-10-29 VITALS — BP 130/80 | HR 76 | Ht 73.0 in | Wt 257.0 lb

## 2016-10-29 DIAGNOSIS — J0100 Acute maxillary sinusitis, unspecified: Secondary | ICD-10-CM

## 2016-10-29 DIAGNOSIS — R197 Diarrhea, unspecified: Secondary | ICD-10-CM | POA: Diagnosis not present

## 2016-10-29 DIAGNOSIS — J209 Acute bronchitis, unspecified: Secondary | ICD-10-CM

## 2016-10-29 NOTE — Progress Notes (Signed)
Subjective:     [name], male   DOB: 08/10/1960, 56 y.o.   MRN: [id number]  HPI Martin [name] is a 56 year old man who presents today for follow up after an ER visit 5 days ago where he was diagnosed with bronchitis, maxillary sinusitis, and nasal congestion. Today, he feels better than he felt at the ER and reports he is 75% better, but still has symptoms. He has continued congestion, head pressure, runny nose, dry cough, shortness of breath, chest pain when coughing, headache especially when coughing over the left frontal and maxillary sinuses. His nose has produced green mucus. He also had diarrhea on Tuesday and was unable to make it to the bathroom on time because it was so bad. He has not had any more bouts of diarrhea since then. He has had no sneezing, itchy or watery eyes, fever, chills, nausea, vomiting, ear pain, scratchy throat, post-nasal drip, or urinary issues.   He took prednisone and azithromycin prescribed by the ER physician and finished those medications on Tuesday and noted that they helped him feel better. He also was given an albuterol inhaler. He has only used this once three days ago after he felt shortness of breath and he felt this did not help with his symptoms. He has not taken any allergy medication, decongestants, or nasal sprays.  He had a similar episode of these same symptoms at the end of March when he presented to the ER and was diagnosed with bronchitis then as well. He reports he doesn't have a history of allergies or asthma.  He also reports he is out of refills on his metoprolol and amlodipine and is planning on picking that up today.  Review of Systems Pertinent positives and negatives included in the subjective above.    Objective:   Physical Exam Patient is alert, in no distress, and talking in complete sentences. Ears were normal and TMs were intact, non-erythematous, and not bulging Throat was clear without erythema or  exudates Tenderness was noted over the left maxillary and frontal sinuses. Neck showed no lymphadenopathy or tenderness. Heart was regular rate and rhythm without murmurs, rubs, or gallops. Lung auscultation showed some diffuse expiratory wheezes.   emergency room record including lab and x-rays was reviewed Assessment and Plan:    Acute bronchitis, unspecified organism  Acute maxillary sinusitis, recurrence not specified  Diarrhea, unspecified type   1. Maxillary Sinusitis/acute bronchitis: Patient is continuing to feel better. We encouraged treating his congestion and other symptoms with over the counter medications as needed. We also informed the patient to let us know if he got worse or did not continue to improve. If he is not significantly better by Monday, then we informed him he should call us. 2. Diarrhea: Patient had an issue with diarrhea on Tuesday after taking the azithromycin. We encouraged him to eat yogurt or take a probiotic to help with this issue. Greater than 25 minutes, over 50% spent in counseling and coordination of care. Patient was seen in conjunction with Dr. Susann GivensLalonde. Tessie Fass-Mackenzie Davis, MS3

## 2017-01-27 ENCOUNTER — Other Ambulatory Visit: Payer: Self-pay | Admitting: Cardiology

## 2017-04-08 ENCOUNTER — Encounter: Payer: BC Managed Care – PPO | Admitting: Family Medicine

## 2017-04-12 ENCOUNTER — Encounter: Payer: BC Managed Care – PPO | Admitting: Family Medicine

## 2017-05-15 ENCOUNTER — Other Ambulatory Visit: Payer: Self-pay | Admitting: Family Medicine

## 2017-05-15 ENCOUNTER — Other Ambulatory Visit: Payer: Self-pay | Admitting: Cardiology

## 2017-05-15 DIAGNOSIS — I10 Essential (primary) hypertension: Secondary | ICD-10-CM

## 2017-05-15 DIAGNOSIS — E785 Hyperlipidemia, unspecified: Secondary | ICD-10-CM

## 2017-05-17 NOTE — Telephone Encounter (Signed)
Pt has appt 06/07/17.

## 2017-06-07 ENCOUNTER — Encounter: Payer: BC Managed Care – PPO | Admitting: Family Medicine

## 2017-07-01 ENCOUNTER — Encounter: Payer: BC Managed Care – PPO | Admitting: Family Medicine

## 2017-07-02 ENCOUNTER — Other Ambulatory Visit: Payer: Self-pay | Admitting: Family Medicine

## 2017-07-02 DIAGNOSIS — E785 Hyperlipidemia, unspecified: Secondary | ICD-10-CM

## 2017-07-02 DIAGNOSIS — I10 Essential (primary) hypertension: Secondary | ICD-10-CM

## 2017-07-05 ENCOUNTER — Encounter: Payer: Self-pay | Admitting: Family Medicine

## 2017-08-01 ENCOUNTER — Other Ambulatory Visit: Payer: Self-pay | Admitting: Family Medicine

## 2017-08-01 DIAGNOSIS — E785 Hyperlipidemia, unspecified: Secondary | ICD-10-CM

## 2017-08-01 DIAGNOSIS — I10 Essential (primary) hypertension: Secondary | ICD-10-CM

## 2017-08-02 ENCOUNTER — Telehealth: Payer: Self-pay

## 2017-08-02 ENCOUNTER — Telehealth: Payer: Self-pay | Admitting: Family Medicine

## 2017-08-02 NOTE — Telephone Encounter (Signed)
Pt called and made an appt. Please refill med until then.

## 2017-08-02 NOTE — Telephone Encounter (Signed)
Pt med was denied due to needing a aapt. Pt was called twice but no answer. Thanks [employer]

## 2017-08-05 ENCOUNTER — Other Ambulatory Visit: Payer: Self-pay

## 2017-08-05 DIAGNOSIS — I10 Essential (primary) hypertension: Secondary | ICD-10-CM

## 2017-08-05 DIAGNOSIS — E785 Hyperlipidemia, unspecified: Secondary | ICD-10-CM

## 2017-08-05 MED ORDER — METOPROLOL SUCCINATE ER 100 MG PO TB24: ORAL_TABLET | ORAL | 0 refills | Status: DC

## 2017-08-05 MED ORDER — LOSARTAN POTASSIUM-HCTZ 100-25 MG PO TABS: 1.0000 | ORAL_TABLET | Freq: Every day | ORAL | 0 refills | Status: DC

## 2017-08-05 MED ORDER — PRAVASTATIN SODIUM 40 MG PO TABS: 40.0000 mg | ORAL_TABLET | Freq: Every day | ORAL | 0 refills | Status: DC

## 2017-08-05 MED ORDER — AMLODIPINE BESYLATE 10 MG PO TABS: 10.0000 mg | ORAL_TABLET | Freq: Every day | ORAL | 0 refills | Status: DC

## 2017-08-05 NOTE — Telephone Encounter (Signed)
Done KH 

## 2017-08-17 ENCOUNTER — Other Ambulatory Visit: Payer: Self-pay | Admitting: *Deleted

## 2017-08-23 ENCOUNTER — Encounter: Payer: BC Managed Care – PPO | Admitting: Family Medicine

## 2017-09-14 ENCOUNTER — Encounter: Payer: BC Managed Care – PPO | Admitting: Family Medicine

## 2017-10-06 ENCOUNTER — Ambulatory Visit (INDEPENDENT_AMBULATORY_CARE_PROVIDER_SITE_OTHER): Payer: BC Managed Care – PPO | Admitting: Family Medicine

## 2017-10-06 ENCOUNTER — Encounter: Payer: Self-pay | Admitting: Family Medicine

## 2017-10-06 VITALS — BP 162/108 | HR 72 | Temp 97.8°F | Ht 71.75 in | Wt 256.4 lb

## 2017-10-06 DIAGNOSIS — H269 Unspecified cataract: Secondary | ICD-10-CM | POA: Diagnosis not present

## 2017-10-06 DIAGNOSIS — E785 Hyperlipidemia, unspecified: Secondary | ICD-10-CM

## 2017-10-06 DIAGNOSIS — N528 Other male erectile dysfunction: Secondary | ICD-10-CM | POA: Diagnosis not present

## 2017-10-06 DIAGNOSIS — J301 Allergic rhinitis due to pollen: Secondary | ICD-10-CM

## 2017-10-06 DIAGNOSIS — K649 Unspecified hemorrhoids: Secondary | ICD-10-CM

## 2017-10-06 DIAGNOSIS — Z87438 Personal history of other diseases of male genital organs: Secondary | ICD-10-CM | POA: Diagnosis not present

## 2017-10-06 DIAGNOSIS — I10 Essential (primary) hypertension: Secondary | ICD-10-CM | POA: Diagnosis not present

## 2017-10-06 DIAGNOSIS — E039 Hypothyroidism, unspecified: Secondary | ICD-10-CM | POA: Diagnosis not present

## 2017-10-06 MED ORDER — LOSARTAN POTASSIUM-HCTZ 100-25 MG PO TABS: 1.0000 | ORAL_TABLET | Freq: Every day | ORAL | 3 refills | Status: DC

## 2017-10-06 MED ORDER — AMLODIPINE BESYLATE 10 MG PO TABS: 10.0000 mg | ORAL_TABLET | Freq: Every day | ORAL | 3 refills | Status: DC

## 2017-10-06 MED ORDER — METOPROLOL SUCCINATE ER 100 MG PO TB24: ORAL_TABLET | ORAL | 3 refills | Status: DC

## 2017-10-06 MED ORDER — PRAVASTATIN SODIUM 40 MG PO TABS: 40.0000 mg | ORAL_TABLET | Freq: Every day | ORAL | 3 refills | Status: DC

## 2017-10-06 NOTE — Progress Notes (Signed)
   Subjective:    [name], male    DOB: 01/09/1961, 57 y.o.   MRN: [id number]  HPI He is here for an interval evaluation.  He did stop taking his losartan because of quality issues but did continue on his other blood pressure meds.  He also has cataracts and has not seen an ophthalmologist for this.  Apparently his optometrist has been following this.  Continues on pravastatin and is having no difficulty with that.  His ED is not causing any difficulty.  He does have BPH but is having no urinary symptoms.  He does have a history of hemorrhoids There is a diagnosis of hypothyroid however he does not remember ever being told he has thyroid disease.  Review of Systems     Objective:   Physical Exam Alert and in no distress. Tympanic membranes and canals are normal. Pharyngeal area is normal. Neck is supple without adenopathy or thyromegaly. Cardiac exam shows a regular sinus rhythm without murmurs or gallops. Lungs are clear to auscultation.        Assessment & Plan:  Allergic rhinitis due to pollen, unspecified seasonality  Cataract, unspecified cataract type, unspecified laterality - Plan: Ambulatory referral to Ophthalmology  Hemorrhoids, unspecified hemorrhoid type - Plan: CBC with Differential/Platelet, Comprehensive metabolic panel  History of BPH - Plan: CBC with Differential/Platelet, Comprehensive metabolic panel  Hyperlipidemia with target LDL less than 100 - Plan: Lipid panel, pravastatin (PRAVACHOL) 40 MG tablet  Essential hypertension - Plan: CBC with Differential/Platelet, Comprehensive metabolic panel, amLODipine (NORVASC) 10 MG tablet, losartan-hydrochlorothiazide (HYZAAR) 100-25 MG tablet, metoprolol succinate (TOPROL-XL) 100 MG 24 hr tablet  Hypothyroidism, unspecified type - Plan: TSH  Other male erectile dysfunction Overall he seems to be doing quite nicely.  We will renew his medications and see him back here in another year.

## 2017-10-07 ENCOUNTER — Other Ambulatory Visit: Payer: Self-pay

## 2017-10-07 DIAGNOSIS — H269 Unspecified cataract: Secondary | ICD-10-CM

## 2017-10-07 LAB — TSH: TSH: 4.02 u[IU]/mL (ref 0.450–4.500)

## 2017-10-07 LAB — CBC WITH DIFFERENTIAL/PLATELET
BASOS: 1 %
Basophils Absolute: 0.1 10*3/uL (ref 0.0–0.2)
EOS (ABSOLUTE): 0.2 10*3/uL (ref 0.0–0.4)
EOS: 3 %
Hematocrit: 41.8 % (ref 37.5–51.0)
Hemoglobin: 13.3 g/dL (ref 13.0–17.7)
IMMATURE GRANS (ABS): 0 10*3/uL (ref 0.0–0.1)
Immature Granulocytes: 0 %
LYMPHS: 37 %
Lymphocytes Absolute: 2.3 10*3/uL (ref 0.7–3.1)
MCH: 30.6 pg (ref 26.6–33.0)
MCHC: 31.8 g/dL (ref 31.5–35.7)
MCV: 96 fL (ref 79–97)
MONOCYTES: 8 %
Monocytes Absolute: 0.5 10*3/uL (ref 0.1–0.9)
NEUTROS ABS: 3.1 10*3/uL (ref 1.4–7.0)
Neutrophils: 51 %
Platelets: 254 10*3/uL (ref 150–379)
RBC: 4.34 x10E6/uL (ref 4.14–5.80)
RDW: 13.6 % (ref 12.3–15.4)
WBC: 6.1 10*3/uL (ref 3.4–10.8)

## 2017-10-07 LAB — LIPID PANEL
CHOL/HDL RATIO: 2.9 ratio (ref 0.0–5.0)
Cholesterol, Total: 138 mg/dL (ref 100–199)
HDL: 47 mg/dL (ref >39)
LDL Calculated: 74 mg/dL (ref 0–99)
Triglycerides: 85 mg/dL (ref 0–149)
VLDL CHOLESTEROL CAL: 17 mg/dL (ref 5–40)

## 2017-10-07 LAB — COMPREHENSIVE METABOLIC PANEL
ALT: 14 IU/L (ref 0–44)
AST: 13 IU/L (ref 0–40)
Albumin/Globulin Ratio: 1.5 (ref 1.2–2.2)
Albumin: 4.4 g/dL (ref 3.5–5.5)
Alkaline Phosphatase: 54 IU/L (ref 39–117)
BILIRUBIN TOTAL: 0.8 mg/dL (ref 0.0–1.2)
BUN/Creatinine Ratio: 16 (ref 9–20)
BUN: 17 mg/dL (ref 6–24)
CHLORIDE: 105 mmol/L (ref 96–106)
CO2: 25 mmol/L (ref 20–29)
Calcium: 9.5 mg/dL (ref 8.7–10.2)
Creatinine, Ser: 1.09 mg/dL (ref 0.76–1.27)
GFR, EST AFRICAN AMERICAN: 85 mL/min/{1.73_m2} (ref >59)
GFR, EST NON AFRICAN AMERICAN: 74 mL/min/{1.73_m2} (ref >59)
Globulin, Total: 2.9 g/dL (ref 1.5–4.5)
Glucose: 90 mg/dL (ref 65–99)
POTASSIUM: 4.5 mmol/L (ref 3.5–5.2)
Sodium: 145 mmol/L — ABNORMAL HIGH (ref 134–144)
TOTAL PROTEIN: 7.3 g/dL (ref 6.0–8.5)

## 2017-10-12 IMAGING — CR DG CHEST 2V
2 series · 2 of 2 positions shown · non-contrast
Comparison: 09/14/2016

CLINICAL DATA: Wheezing, shortness of breath and productive cough.

EXAM:
CHEST  2 VIEW

[chest pa]
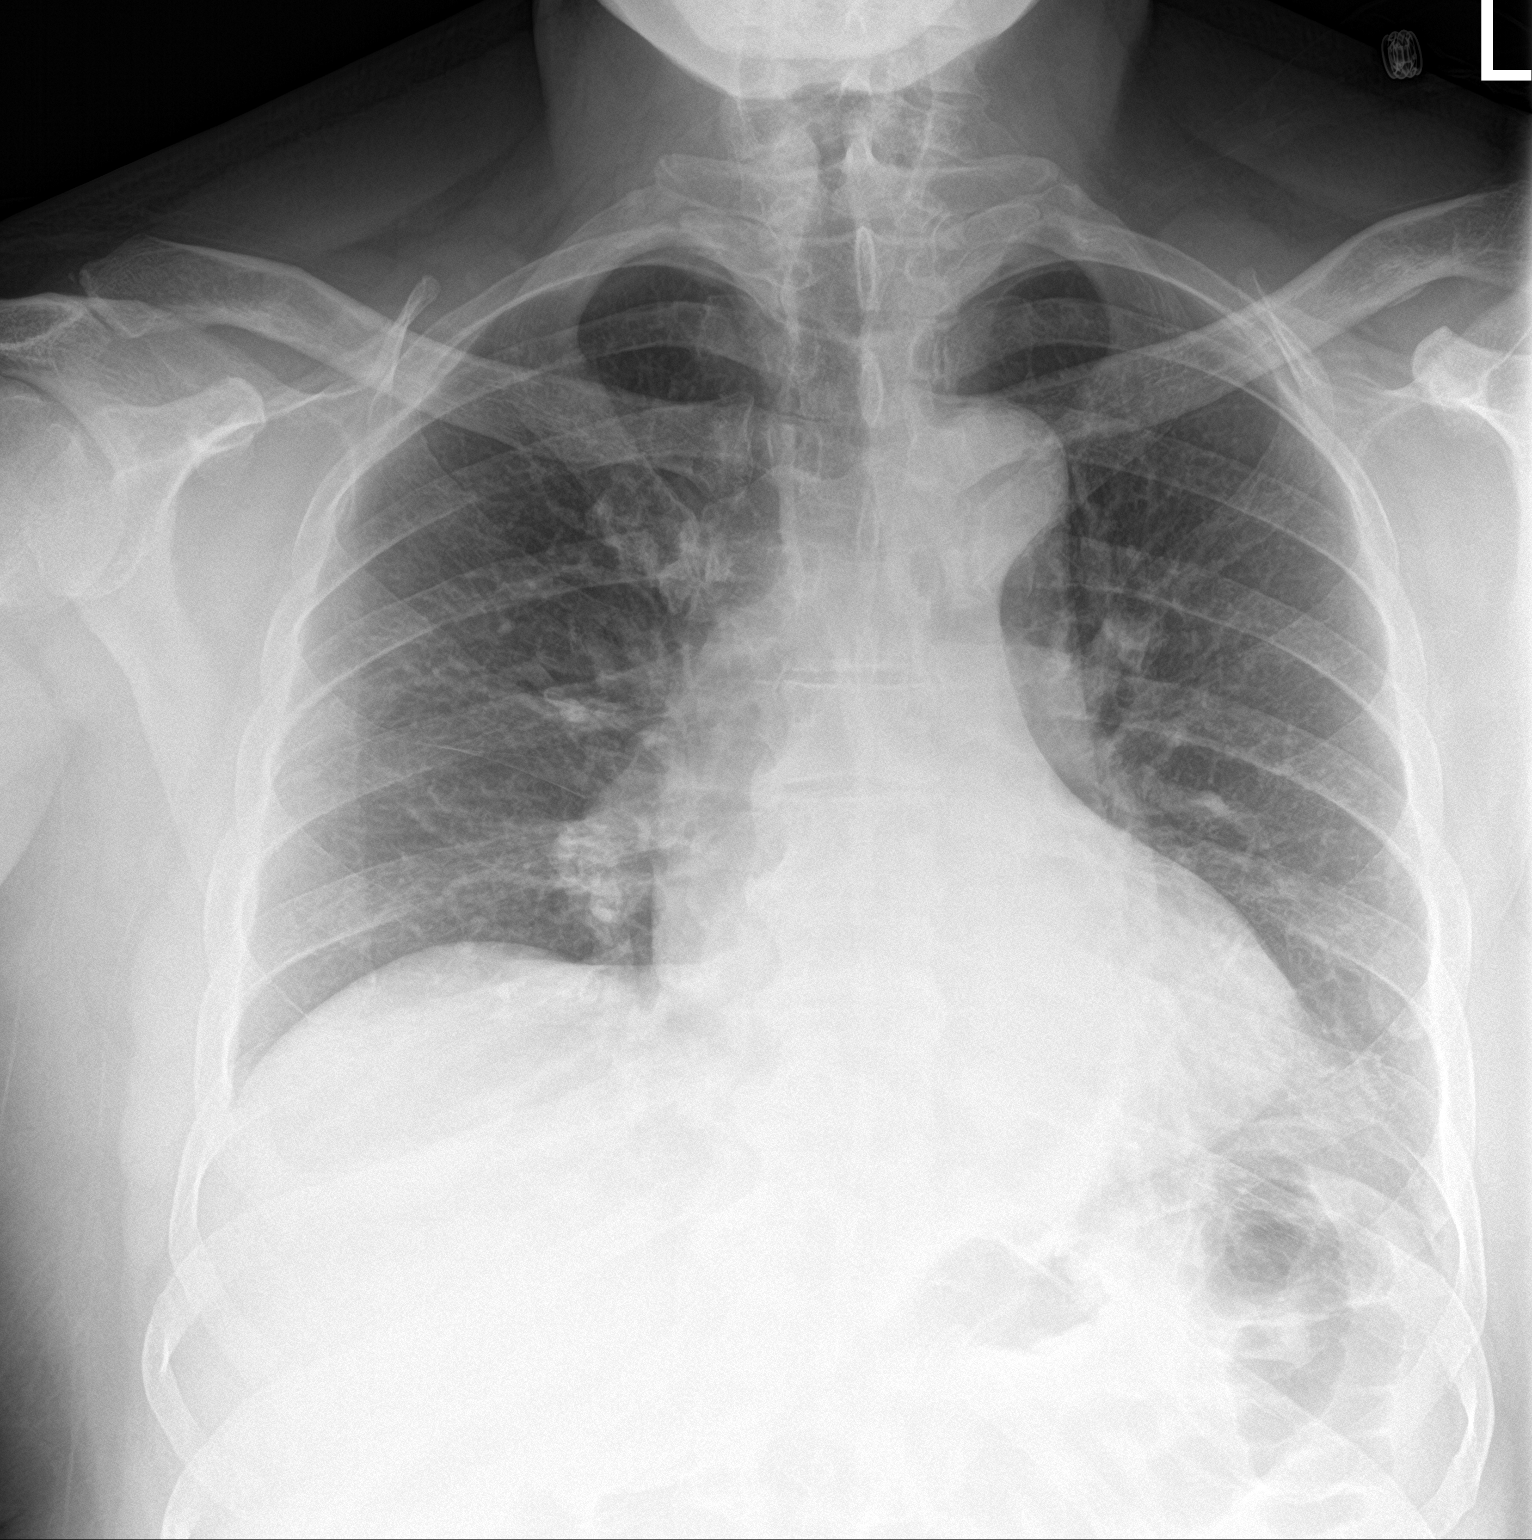

[chest lat]
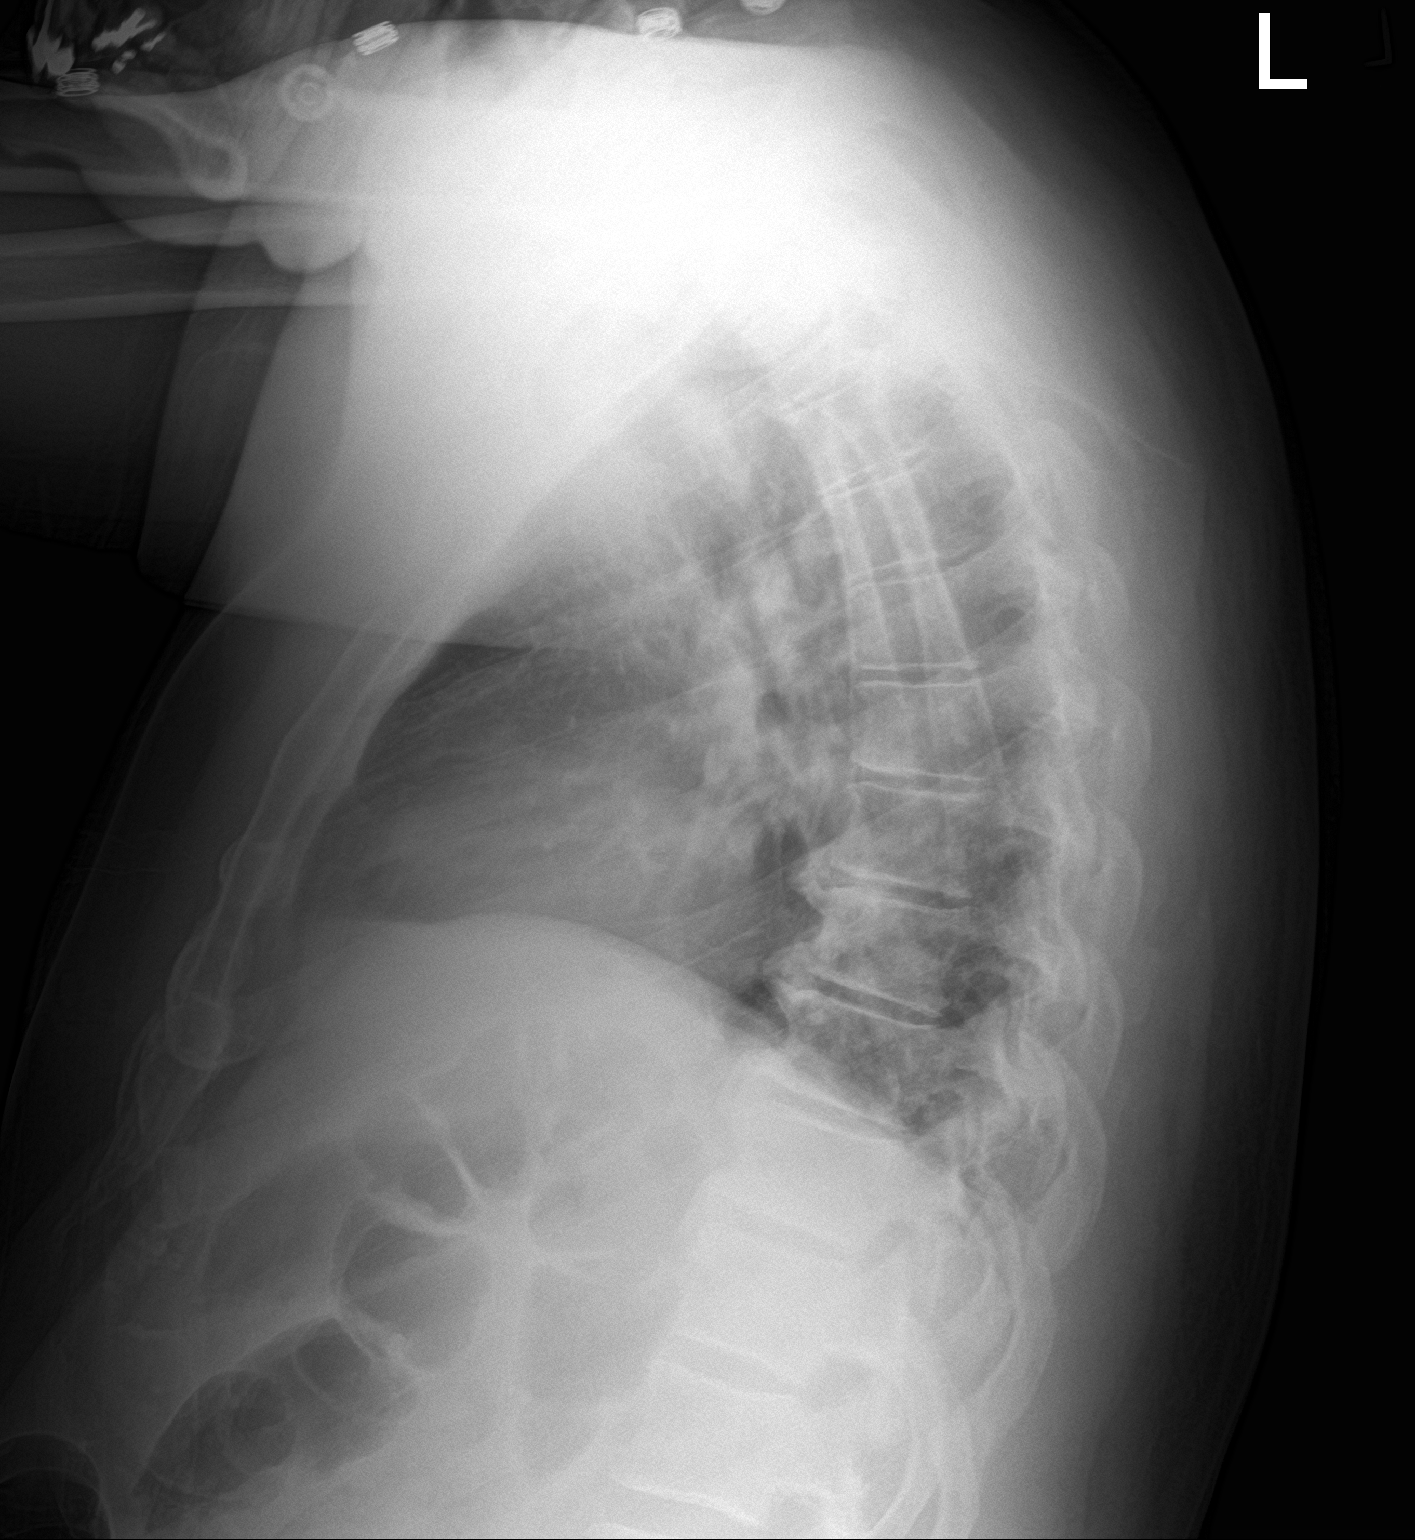

[2 of 2 positions shown; findings below may reference images not displayed]

FINDINGS: The heart size is stable and within normal limits. There is stable
tortuosity of the thoracic aorta. There is no evidence of pulmonary
edema, consolidation, pneumothorax, nodule or pleural fluid. Stable
degenerative disease of the thoracic spine.
IMPRESSION: No active cardiopulmonary disease.

## 2018-06-06 ENCOUNTER — Telehealth: Payer: Self-pay | Admitting: Family Medicine

## 2018-06-06 NOTE — Telephone Encounter (Signed)
lvm for pt that med was called in to [facility]. KH

## 2018-06-06 NOTE — Telephone Encounter (Signed)
[facility] req refill   Losartin/HCTZ on back order.  Can you split the rx?

## 2018-12-21 ENCOUNTER — Telehealth: Payer: Self-pay

## 2018-12-21 NOTE — Telephone Encounter (Signed)
LVM for pt advising ogf appt may be needed if he continues to have swelling and pain. Martin [name]

## 2018-12-21 NOTE — Telephone Encounter (Signed)
If he has concerns, he should be scheduled for an appointment tomorrow .

## 2018-12-21 NOTE — Telephone Encounter (Signed)
Pt called advised he had been stung by a wasp Monday. Pt says his neck is swollen and he think stinger is still in his neck. Please advise [facility]

## 2018-12-26 NOTE — Telephone Encounter (Signed)
Spoke to pt and he advised that he is doing much better. [place]

## 2019-04-17 ENCOUNTER — Encounter: Payer: Self-pay | Admitting: Gastroenterology

## 2019-05-29 ENCOUNTER — Other Ambulatory Visit: Payer: Self-pay

## 2019-05-29 DIAGNOSIS — Z20822 Contact with and (suspected) exposure to covid-19: Secondary | ICD-10-CM

## 2019-05-30 ENCOUNTER — Other Ambulatory Visit: Payer: Self-pay

## 2019-05-30 ENCOUNTER — Emergency Department (HOSPITAL_COMMUNITY): Payer: BC Managed Care – PPO

## 2019-05-30 ENCOUNTER — Encounter (HOSPITAL_COMMUNITY): Payer: Self-pay | Admitting: *Deleted

## 2019-05-30 ENCOUNTER — Inpatient Hospital Stay (HOSPITAL_COMMUNITY)
Admission: EM | Admit: 2019-05-30 | Discharge: 2019-06-07 | DRG: 418 | Disposition: A | Discharge status: Home / Self Care | Payer: BC Managed Care – PPO | Attending: Internal Medicine | Admitting: Internal Medicine

## 2019-05-30 DIAGNOSIS — Z79899 Other long term (current) drug therapy: Secondary | ICD-10-CM

## 2019-05-30 DIAGNOSIS — K922 Gastrointestinal hemorrhage, unspecified: Secondary | ICD-10-CM | POA: Diagnosis not present

## 2019-05-30 DIAGNOSIS — Z841 Family history of disorders of kidney and ureter: Secondary | ICD-10-CM

## 2019-05-30 DIAGNOSIS — N189 Chronic kidney disease, unspecified: Secondary | ICD-10-CM | POA: Diagnosis present

## 2019-05-30 DIAGNOSIS — E785 Hyperlipidemia, unspecified: Secondary | ICD-10-CM | POA: Diagnosis present

## 2019-05-30 DIAGNOSIS — R52 Pain, unspecified: Secondary | ICD-10-CM

## 2019-05-30 DIAGNOSIS — K644 Residual hemorrhoidal skin tags: Secondary | ICD-10-CM | POA: Diagnosis present

## 2019-05-30 DIAGNOSIS — Z7982 Long term (current) use of aspirin: Secondary | ICD-10-CM

## 2019-05-30 DIAGNOSIS — I10 Essential (primary) hypertension: Secondary | ICD-10-CM | POA: Diagnosis not present

## 2019-05-30 DIAGNOSIS — K921 Melena: Secondary | ICD-10-CM | POA: Diagnosis not present

## 2019-05-30 DIAGNOSIS — K810 Acute cholecystitis: Secondary | ICD-10-CM | POA: Diagnosis present

## 2019-05-30 DIAGNOSIS — R1084 Generalized abdominal pain: Secondary | ICD-10-CM

## 2019-05-30 DIAGNOSIS — I4891 Unspecified atrial fibrillation: Secondary | ICD-10-CM | POA: Diagnosis present

## 2019-05-30 DIAGNOSIS — N179 Acute kidney failure, unspecified: Secondary | ICD-10-CM | POA: Diagnosis present

## 2019-05-30 DIAGNOSIS — Z825 Family history of asthma and other chronic lower respiratory diseases: Secondary | ICD-10-CM

## 2019-05-30 DIAGNOSIS — I129 Hypertensive chronic kidney disease with stage 1 through stage 4 chronic kidney disease, or unspecified chronic kidney disease: Secondary | ICD-10-CM | POA: Diagnosis present

## 2019-05-30 DIAGNOSIS — Z79891 Long term (current) use of opiate analgesic: Secondary | ICD-10-CM | POA: Diagnosis not present

## 2019-05-30 DIAGNOSIS — K8000 Calculus of gallbladder with acute cholecystitis without obstruction: Principal | ICD-10-CM | POA: Diagnosis present

## 2019-05-30 DIAGNOSIS — R112 Nausea with vomiting, unspecified: Secondary | ICD-10-CM

## 2019-05-30 DIAGNOSIS — Z9049 Acquired absence of other specified parts of digestive tract: Secondary | ICD-10-CM | POA: Diagnosis not present

## 2019-05-30 DIAGNOSIS — K8010 Calculus of gallbladder with chronic cholecystitis without obstruction: Secondary | ICD-10-CM | POA: Diagnosis present

## 2019-05-30 DIAGNOSIS — E039 Hypothyroidism, unspecified: Secondary | ICD-10-CM | POA: Diagnosis present

## 2019-05-30 DIAGNOSIS — K4020 Bilateral inguinal hernia, without obstruction or gangrene, not specified as recurrent: Secondary | ICD-10-CM | POA: Diagnosis present

## 2019-05-30 DIAGNOSIS — I480 Paroxysmal atrial fibrillation: Secondary | ICD-10-CM | POA: Diagnosis present

## 2019-05-30 DIAGNOSIS — R1013 Epigastric pain: Secondary | ICD-10-CM | POA: Diagnosis not present

## 2019-05-30 DIAGNOSIS — Z792 Long term (current) use of antibiotics: Secondary | ICD-10-CM

## 2019-05-30 DIAGNOSIS — K760 Fatty (change of) liver, not elsewhere classified: Secondary | ICD-10-CM | POA: Diagnosis present

## 2019-05-30 DIAGNOSIS — Z20828 Contact with and (suspected) exposure to other viral communicable diseases: Secondary | ICD-10-CM | POA: Diagnosis present

## 2019-05-30 DIAGNOSIS — I4510 Unspecified right bundle-branch block: Secondary | ICD-10-CM | POA: Diagnosis present

## 2019-05-30 DIAGNOSIS — Z8249 Family history of ischemic heart disease and other diseases of the circulatory system: Secondary | ICD-10-CM

## 2019-05-30 DIAGNOSIS — H269 Unspecified cataract: Secondary | ICD-10-CM | POA: Diagnosis present

## 2019-05-30 DIAGNOSIS — Z01818 Encounter for other preprocedural examination: Secondary | ICD-10-CM | POA: Diagnosis not present

## 2019-05-30 DIAGNOSIS — R109 Unspecified abdominal pain: Secondary | ICD-10-CM

## 2019-05-30 DIAGNOSIS — R1010 Upper abdominal pain, unspecified: Secondary | ICD-10-CM | POA: Diagnosis not present

## 2019-05-30 LAB — URINALYSIS, ROUTINE W REFLEX MICROSCOPIC
Bacteria, UA: NONE SEEN
Bilirubin Urine: NEGATIVE
Glucose, UA: NEGATIVE mg/dL
Hgb urine dipstick: NEGATIVE
Ketones, ur: NEGATIVE mg/dL
Leukocytes,Ua: NEGATIVE
Nitrite: NEGATIVE
Protein, ur: 30 mg/dL — AB
Specific Gravity, Urine: 1.021 (ref 1.005–1.030)
pH: 5 (ref 5.0–8.0)

## 2019-05-30 LAB — COMPREHENSIVE METABOLIC PANEL
ALT: 35 U/L (ref 0–44)
AST: 26 U/L (ref 15–41)
Albumin: 3 g/dL — ABNORMAL LOW (ref 3.5–5.0)
Alkaline Phosphatase: 72 U/L (ref 38–126)
Anion gap: 15 (ref 5–15)
BUN: 56 mg/dL — ABNORMAL HIGH (ref 6–20)
CO2: 27 mmol/L (ref 22–32)
Calcium: 9.1 mg/dL (ref 8.9–10.3)
Chloride: 98 mmol/L (ref 98–111)
Creatinine, Ser: 3.75 mg/dL — ABNORMAL HIGH (ref 0.61–1.24)
GFR calc Af Amer: 19 mL/min — ABNORMAL LOW (ref >60)
GFR calc non Af Amer: 16 mL/min — ABNORMAL LOW (ref >60)
Glucose, Bld: 130 mg/dL — ABNORMAL HIGH (ref 70–99)
Potassium: 4.1 mmol/L (ref 3.5–5.1)
Sodium: 140 mmol/L (ref 135–145)
Total Bilirubin: 1.7 mg/dL — ABNORMAL HIGH (ref 0.3–1.2)
Total Protein: 7.8 g/dL (ref 6.5–8.1)

## 2019-05-30 LAB — CBC
HCT: 41.3 % (ref 39.0–52.0)
Hemoglobin: 13.4 g/dL (ref 13.0–17.0)
MCH: 30.6 pg (ref 26.0–34.0)
MCHC: 32.4 g/dL (ref 30.0–36.0)
MCV: 94.3 fL (ref 80.0–100.0)
Platelets: 276 10*3/uL (ref 150–400)
RBC: 4.38 MIL/uL (ref 4.22–5.81)
RDW: 14.5 % (ref 11.5–15.5)
WBC: 17.5 10*3/uL — ABNORMAL HIGH (ref 4.0–10.5)
nRBC: 0 % (ref 0.0–0.2)

## 2019-05-30 LAB — LIPASE, BLOOD: Lipase: 32 U/L (ref 11–51)

## 2019-05-30 LAB — BRAIN NATRIURETIC PEPTIDE: B Natriuretic Peptide: 75.9 pg/mL (ref 0.0–100.0)

## 2019-05-30 LAB — NOVEL CORONAVIRUS, NAA: SARS-CoV-2, NAA: NOT DETECTED

## 2019-05-30 LAB — SARS CORONAVIRUS 2 (TAT 6-24 HRS): SARS Coronavirus 2: NEGATIVE

## 2019-05-30 MED ORDER — DILTIAZEM HCL-DEXTROSE 125-5 MG/125ML-% IV SOLN (PREMIX)
5.0000 mg/h | INTRAVENOUS | Status: DC
  Administered 2019-05-30: 5 mg/h via INTRAVENOUS
  Administered 2019-05-31: 10 mg/h via INTRAVENOUS
  Administered 2019-05-31: 5 mg/h via INTRAVENOUS
  Administered 2019-05-31 (×2): 12.5 mg/h via INTRAVENOUS
  Administered 2019-06-01 – 2019-06-02 (×4): 15 mg/h via INTRAVENOUS
  Filled 2019-05-30 (×8): qty 125

## 2019-05-30 MED ORDER — ACETAMINOPHEN 650 MG RE SUPP: 650.0000 mg | Freq: Four times a day (QID) | RECTAL | Status: DC | PRN

## 2019-05-30 MED ORDER — PIPERACILLIN-TAZOBACTAM 3.375 G IVPB 30 MIN
3.3750 g | Freq: Once | INTRAVENOUS | Status: AC
  Administered 2019-05-30: 3.375 g via INTRAVENOUS
  Filled 2019-05-30: qty 50

## 2019-05-30 MED ORDER — SODIUM CHLORIDE 0.9% FLUSH
3.0000 mL | Freq: Once | INTRAVENOUS | Status: AC
  Administered 2019-05-30: 3 mL via INTRAVENOUS

## 2019-05-30 MED ORDER — DILTIAZEM LOAD VIA INFUSION
10.0000 mg | Freq: Once | INTRAVENOUS | Status: AC
  Administered 2019-05-30: 10 mg via INTRAVENOUS
  Filled 2019-05-30: qty 10

## 2019-05-30 MED ORDER — MORPHINE SULFATE (PF) 2 MG/ML IV SOLN: 1.0000 mg | INTRAVENOUS | Status: DC | PRN

## 2019-05-30 MED ORDER — ACETAMINOPHEN 325 MG PO TABS: 650.0000 mg | ORAL_TABLET | Freq: Four times a day (QID) | ORAL | Status: DC | PRN

## 2019-05-30 MED ORDER — ONDANSETRON HCL 4 MG/2ML IJ SOLN: 4.0000 mg | Freq: Four times a day (QID) | INTRAMUSCULAR | Status: DC | PRN

## 2019-05-30 MED ORDER — SODIUM CHLORIDE 0.9 % IV SOLN
INTRAVENOUS | Status: DC
  Administered 2019-05-31 (×3): via INTRAVENOUS

## 2019-05-30 MED ORDER — SODIUM CHLORIDE 0.9 % IV BOLUS
1000.0000 mL | Freq: Once | INTRAVENOUS | Status: AC
  Administered 2019-05-30: 1000 mL via INTRAVENOUS

## 2019-05-30 MED ORDER — ONDANSETRON HCL 4 MG PO TABS: 4.0000 mg | ORAL_TABLET | Freq: Four times a day (QID) | ORAL | Status: DC | PRN

## 2019-05-30 MED ORDER — PIPERACILLIN-TAZOBACTAM 3.375 G IVPB
3.3750 g | Freq: Three times a day (TID) | INTRAVENOUS | Status: DC
  Administered 2019-05-31 – 2019-06-06 (×19): 3.375 g via INTRAVENOUS
  Filled 2019-05-30 (×21): qty 50

## 2019-05-30 NOTE — ED Provider Notes (Signed)
[facility] EMERGENCY DEPARTMENT Provider Note   CSN: [id number] Arrival date & time: 05/30/19  1102     History   Chief Complaint Chief Complaint  Patient presents with  . Emesis  . Abdominal Pain    HPI Martin [name] is a 58 y.o. male.     HPI  He presents for evaluation of nausea, vomiting, abdominal cramping, and shortness of breath.  Onset of symptoms 2 days ago.  He is taking his medicine but feels like he sometimes vomits it up.  States he has a cardiac condition for which he takes "4 types of medicine."  He denies known sick exposures.  He lives with his wife.  There are no other known modifying factors.  Past Medical History:  Diagnosis Date  . Cardiomegaly    LVH  . Cataract   . Hemorrhoids   . Hyperlipidemia   . Hypertension   . Hypothyroidism   . Lower GI bleeding     Patient Active Problem List   Diagnosis Date Noted  . History of BPH 04/03/2016  . Allergic rhinitis due to pollen 02/18/2015  . Other male erectile dysfunction 02/18/2015  . Cataract 02/15/2014  . Hemorrhoids 02/10/2013  . Hypertension 03/17/2011  . Hyperlipidemia with target LDL less than 100 03/17/2011  . LVH (left ventricular hypertrophy) due to hypertensive disease 03/17/2011  . Hypothyroid 03/17/2011    Past Surgical History:  Procedure Laterality Date  . DOPPLER ECHOCARDIOGRAPHY          Home Medications    Prior to Admission medications   Medication Sig Start Date End Date Taking? Authorizing Provider  amLODipine (NORVASC) 10 MG tablet Take 1 tablet (10 mg total) by mouth daily. 10/06/17   Ronnald Nian, MD  aspirin 81 MG tablet Take 81 mg by mouth daily.      [provider]  azithromycin (ZITHROMAX Z-PAK) 250 MG tablet Take 2 tabs today and 1 tab for the next 4 days Patient not taking: Reported on 10/29/2016 10/24/16   Michela Pitcher A, PA-C  benzonatate (TESSALON) 100 MG capsule Take 1 capsule (100 mg total) by mouth every 8 (eight) hours.  Patient not taking: Reported on 10/29/2016 09/14/16   Jaynie Crumble, PA-C  cetirizine (ZYRTEC ALLERGY) 10 MG tablet Take 1 tablet (10 mg total) by mouth daily. 09/14/16   Kirichenko, Tatyana, PA-C  losartan-hydrochlorothiazide (HYZAAR) 100-25 MG tablet Take 1 tablet by mouth daily. 10/06/17   Ronnald Nian, MD  metoprolol succinate (TOPROL-XL) 100 MG 24 hr tablet TAKE 1 TABLET BY MOUTH ONCE DAILY WITH  OR  IMMEDIATELY  FOLLOWING  A  MEAL 10/06/17   Ronnald Nian, MD  pravastatin (PRAVACHOL) 40 MG tablet Take 1 tablet (40 mg total) by mouth daily. 10/06/17   Ronnald Nian, MD  sildenafil (REVATIO) 20 MG tablet Take 1 to 5 pills as needed 07/06/16   Ronnald Nian, MD  traMADol (ULTRAM) 50 MG tablet Take 50 mg by mouth every 6 (six) hours as needed for moderate pain.    [provider]  triamcinolone cream (KENALOG) 0.5 % Apply 1 application topically 3 (three) times daily. 01/21/14   Morrell Riddle, PA-C    Family History Family History  Problem Relation Age of Onset  . Kidney disease Mother   . Hypertension Father   . Kidney disease Father   . Asthma Father     Social History Social History   Tobacco Use  . Smoking status: Never Smoker  .  Smokeless tobacco: Never Used  Substance Use Topics  . Alcohol use: Yes    Comment: Rare  . Drug use: No     Allergies   Garlic and Shrimp [shellfish allergy]   Review of Systems Review of Systems  All other systems reviewed and are negative.    Physical Exam Updated Vital Signs BP 116/81   Pulse 68   Temp 97.8 F (36.6 C) (Oral)   Resp (!) 23   SpO2 96%   Physical Exam Vitals signs and nursing note reviewed.  Constitutional:      General: He is not in acute distress.    Appearance: He is well-developed. He is obese. He is not ill-appearing, toxic-appearing or diaphoretic.  HENT:     Head: Normocephalic and atraumatic.     Right Ear: External ear normal.     Left Ear: External ear normal.  Eyes:      Conjunctiva/sclera: Conjunctivae normal.     Pupils: Pupils are equal, round, and reactive to light.  Neck:     Musculoskeletal: Normal range of motion and neck supple.     Trachea: Phonation normal.  Cardiovascular:     Rate and Rhythm: Tachycardia present. Rhythm irregular.     Heart sounds: Normal heart sounds.     Comments: Radial pulse, right arm, bradycardic around 50 while heart rate reading 120, on cardiac monitor.  Evidence of poor perfusion with rapid rate and irregular rhythm. Pulmonary:     Effort: Pulmonary effort is normal.     Breath sounds: Normal breath sounds.  Abdominal:     General: There is no distension.     Palpations: Abdomen is soft. There is no mass.     Tenderness: There is no abdominal tenderness.     Hernia: No hernia is present.  Musculoskeletal: Normal range of motion.        General: No swelling or tenderness.     Right lower leg: No edema.     Left lower leg: No edema.  Skin:    General: Skin is warm and dry.  Neurological:     Mental Status: He is alert and oriented to person, place, and time.     Cranial Nerves: No cranial nerve deficit.     Sensory: No sensory deficit.     Motor: No abnormal muscle tone.     Coordination: Coordination normal.  Psychiatric:        Mood and Affect: Mood normal.        Behavior: Behavior normal.        Thought Content: Thought content normal.        Judgment: Judgment normal.      ED Treatments / Results  Labs (all labs ordered are listed, but only abnormal results are displayed) Labs Reviewed  COMPREHENSIVE METABOLIC PANEL - Abnormal; Notable for the following components:      Result Value   Glucose, Bld 130 (*)    BUN 56 (*)    Creatinine, Ser 3.75 (*)    Albumin 3.0 (*)    Total Bilirubin 1.7 (*)    GFR calc non Af Amer 16 (*)    GFR calc Af Amer 19 (*)    All other components within normal limits  CBC - Abnormal; Notable for the following components:   WBC 17.5 (*)    All other components  within normal limits  URINALYSIS, ROUTINE W REFLEX MICROSCOPIC - Abnormal; Notable for the following components:   Color, Urine AMBER (*)  APPearance HAZY (*)    Protein, ur 30 (*)    All other components within normal limits  SARS CORONAVIRUS 2 (TAT 6-24 HRS)  LIPASE, BLOOD  BRAIN NATRIURETIC PEPTIDE    EKG EKG Interpretation  Date/Time:  Tuesday May 30 2019 11:39:59 EST Ventricular Rate:  141 PR Interval:    QRS Duration: 148 QT Interval:  344 QTC Calculation: 526 R Axis:   -25 Text Interpretation: Atrial fibrillation with rapid ventricular response Right bundle branch block Abnormal ECG Since last tracing Rate faster and noe in Atrial fibrillation with rapid ventricular response Confirmed by Mancel BaleWentz, Brannon Decaire [id number]) on 05/30/2019 2:11:07 PM  This patients CHA2DS2-VASc Score and unadjusted Ischemic Stroke Rate (% per year) is equal to 0.6 % stroke rate/year from a score of 1 For HTN  Above score calculated as 1 point each if present [CHF, HTN, DM, Vascular=MI/PAD/Aortic Plaque, Age if 65-74, or Male] Above score calculated as 2 points each if present [Age > 75, or Stroke/TIA/TE]   Radiology Dg Chest Port 1 View  Result Date: 05/30/2019 CLINICAL DATA:  Shortness of breath. EXAM: PORTABLE CHEST 1 VIEW COMPARISON:  Oct 24, 2016 FINDINGS: Minimal atelectasis in the left base. No pneumothorax. The heart, hila, mediastinum, lungs, and pleura are normal. IMPRESSION: No acute abnormalities. Electronically Signed   By: Gerome Samavid  Williams III M.D   On: 05/30/2019 14:41    Procedures .Critical Care Performed by: Mancel BaleWentz, Cybill Uriegas, MD Authorized by: Mancel BaleWentz, Latasha Buczkowski, MD   Critical care provider statement:    Critical care time (minutes):  35   Critical care start time:  05/30/2019 2:00 PM   Critical care end time:  05/30/2019 5:44 PM   Critical care time was exclusive of:  Separately billable procedures and treating other patients   Critical care was necessary to treat or prevent  imminent or life-threatening deterioration of the following conditions:  Circulatory failure   Critical care was time spent personally by me on the following activities:  Blood draw for specimens, development of treatment plan with patient or surrogate, discussions with consultants, evaluation of patient's response to treatment, examination of patient, obtaining history from patient or surrogate, ordering and performing treatments and interventions, ordering and review of laboratory studies, pulse oximetry, re-evaluation of patient's condition, review of old charts and ordering and review of radiographic studies   (including critical care time)  Medications Ordered in ED Medications  diltiazem (CARDIZEM) 1 mg/mL load via infusion 10 mg (10 mg Intravenous Bolus from Bag 05/30/19 1513)    And  diltiazem (CARDIZEM) 125 mg in dextrose 5% 125 mL (1 mg/mL) infusion (5 mg/hr Intravenous New Bag/Given 05/30/19 1508)  sodium chloride flush (NS) 0.9 % injection 3 mL (3 mLs Intravenous Given 05/30/19 1326)  sodium chloride 0.9 % bolus 1,000 mL (0 mLs Intravenous Stopped 05/30/19 1553)     Initial Impression / Assessment and Plan / ED Course  I have reviewed the triage vital signs and the nursing notes.  Pertinent labs & imaging results that were available during my care of the patient were reviewed by me and considered in my medical decision making (see chart for details).  Clinical Course as of May 30 1747  Tue May 30, 2019  1417 Normal except glucose high, BUN high, creatinine high, albumin low, total protein high, GFR low  Comprehensive metabolic panel(!) [EW]  1418 Normal except white count high  CBC(!) [EW]  1418 Normal except presence of protein  Urinalysis, Routine w reflex microscopic(!) [EW]  1658 Leukocytes,Ua: NEGATIVE [  EW]  1658 Mucus: PRESENT [EW]    Clinical Course User Index [EW] Mancel Bale, MD        Patient Vitals for the past 24 hrs:  BP Temp Temp src Pulse Resp SpO2   05/30/19 1506 116/81 - - 68 (!) 23 96 %  05/30/19 1108 109/77 97.8 F (36.6 C) Oral (!) 50 14 97 %    5:48 PM Reevaluation with update and discussion. After initial assessment and treatment, an updated evaluation reveals no change in status, findings discussed. Mancel Bale   Medical Decision Making: Patient presenting with nausea and vomiting, and found to be in new onset atrial fibrillation with RVR.  Suspect intravascular volume related.  New onset elevated BUN and creatinine.  Urinalysis normal.  Doubt ACS.  He will require hospitalization.  CRITICAL CARE-yes Performed by: Mancel Bale  Nursing Notes Reviewed/ Care Coordinated Applicable Imaging Reviewed Interpretation of Laboratory Data incorporated into ED treatment  5:15 PM-Consult complete with hospitalist. Patient case explained and discussed.  She agrees to admit patient for further evaluation and treatment. Call ended at 1545 PM  Final Clinical Impressions(s) / ED Diagnoses   Final diagnoses:  Atrial fibrillation with RVR (HCC)  Non-intractable vomiting with nausea, unspecified vomiting type  Acute renal failure, unspecified acute renal failure type [facility])    ED Discharge Orders    None       Mancel Bale, MD 05/30/19 1749

## 2019-05-30 NOTE — H&P (Signed)
History and Physical    [facility] ZOX:[id number] DOB: 05/26/1961 DOA: 05/30/2019  PCP: [name], [name], MD  Patient coming from: Home.  Chief Complaint: Abdominal pain nausea vomiting.  HPI: Martin [name] is a 58 y.o. male with history of hypertension and hyperlipidemia presents to the ER with complaints of abdominal pain with nausea vomiting over the last 2 days.  Pain is mostly in the right upper quadrant.  Denies any diarrhea.  Pain is constant irrespective of the diet.  Denies any blood in the vomitus.  Denies any chest pain or shortness of breath.  ED Course: In the ER patient was found to be in A. fib with RVR for which patient was placed on Cardizem infusion after Cardizem bolus.  COVID-19 test was negative.  Labs show acute renal failure with creatinine having worsened from 1 in April 2019 and presently is around 3.75.  Also lab work show leukocytosis of 17.5.  LFTs were normal.  CT abdomen and pelvis shows features concerning for cholecystitis with possible perforation.  On-call general surgeon Dr. Derrell Lollingamirez was consulted who requested to get sonogram of the abdomen which only shows cholecystitis.  Patient is on empiric antibiotics Cardizem infusion and admitted for further management.  Review of Systems: As per HPI, rest all negative.   Past Medical History:  Diagnosis Date   Cardiomegaly    LVH   Cataract    Hemorrhoids    Hyperlipidemia    Hypertension    Hypothyroidism    Lower GI bleeding     Past Surgical History:  Procedure Laterality Date   DOPPLER ECHOCARDIOGRAPHY       reports that he has never smoked. He has never used smokeless tobacco. He reports current alcohol use. He reports that he does not use drugs.  Allergies  Allergen Reactions   Garlic Swelling   Shrimp [Shellfish Allergy] Swelling    Family History  Problem Relation Age of Onset   Kidney disease Mother    Hypertension Father    Kidney disease Father    Asthma  Father     Prior to Admission medications   Medication Sig Start Date End Date Taking? Authorizing Provider  amLODipine (NORVASC) 10 MG tablet Take 1 tablet (10 mg total) by mouth daily. 10/06/17  Yes [name], [name], MD  aspirin 81 MG tablet Take 81 mg by mouth daily.     Yes [provider]  cetirizine (ZYRTEC ALLERGY) 10 MG tablet Take 1 tablet (10 mg total) by mouth daily. Patient taking differently: Take 10 mg by mouth daily as needed for allergies.  09/14/16  Yes Kirichenko, Tatyana, PA-C  hydrochlorothiazide (HYDRODIURIL) 25 MG tablet Take 25 mg by mouth daily. 03/28/19  Yes [provider]  losartan (COZAAR) 100 MG tablet Take 100 mg by mouth daily. 03/28/19  Yes [provider]  metoprolol tartrate (LOPRESSOR) 50 MG tablet Take 50 mg by mouth 2 (two) times daily. 03/28/19  Yes [provider]  pravastatin (PRAVACHOL) 40 MG tablet Take 1 tablet (40 mg total) by mouth daily. 10/06/17  Yes [name], [name], MD  sildenafil (REVATIO) 20 MG tablet Take 1 to 5 pills as needed Patient taking differently: Take 20-120 mg by mouth daily as needed (ED).  07/06/16  Yes [name], [name], MD  traMADol (ULTRAM) 50 MG tablet Take 50 mg by mouth every 6 (six) hours as needed for moderate pain.   Yes [provider]  azithromycin (ZITHROMAX Z-PAK) 250 MG tablet Take 2 tabs  today and 1 tab for the next 4 days Patient not taking: Reported on 10/29/2016 10/24/16   Michela Pitcher A, PA-C  benzonatate (TESSALON) 100 MG capsule Take 1 capsule (100 mg total) by mouth every 8 (eight) hours. Patient not taking: Reported on 10/29/2016 09/14/16   Jaynie Crumble, PA-C  losartan-hydrochlorothiazide (HYZAAR) 100-25 MG tablet Take 1 tablet by mouth daily. Patient not taking: Reported on 05/30/2019 10/06/17   Ronnald Nian, MD  metoprolol succinate (TOPROL-XL) 100 MG 24 hr tablet TAKE 1 TABLET BY MOUTH ONCE DAILY WITH  OR  IMMEDIATELY  FOLLOWING  A  MEAL Patient not taking: Reported on  05/30/2019 10/06/17   Ronnald Nian, MD  triamcinolone cream (KENALOG) 0.5 % Apply 1 application topically 3 (three) times daily. Patient not taking: Reported on 05/30/2019 01/21/14   Morrell Riddle, PA-C    Physical Exam: Constitutional: Moderately built and nourished. Vitals:   05/30/19 1630 05/30/19 1700 05/30/19 1730 05/30/19 1900  BP: 131/73 121/60 (!) 99/57 125/89  Pulse:  (!) 109  (!) 110  Resp: (!) 28 (!) 26 (!) 27 (!) 28  Temp:      TempSrc:      SpO2:  97%  92%   Eyes: Anicteric no pallor. ENMT: No discharge from the ears eyes nose or mouth. Neck: No mass felt.  No neck rigidity. Respiratory: No rhonchi or crepitations. Cardiovascular: S1-S2 heard. Abdomen: Soft right upper quadrant tenderness no guarding or rigidity. Musculoskeletal: No edema. Skin: No rash. Neurologic: Alert awake oriented to time place and person.  Moves all extremities. Psychiatric: Appears normal per normal affect.   Labs on Admission: I have personally reviewed following labs and imaging studies  CBC: Recent Labs  Lab 05/30/19 1137  WBC 17.5*  HGB 13.4  HCT 41.3  MCV 94.3  PLT 276   Basic Metabolic Panel: Recent Labs  Lab 05/30/19 1137  NA 140  K 4.1  CL 98  CO2 27  GLUCOSE 130*  BUN 56*  CREATININE 3.75*  CALCIUM 9.1   GFR: CrCl cannot be calculated (Unknown ideal weight.). Liver Function Tests: Recent Labs  Lab 05/30/19 1137  AST 26  ALT 35  ALKPHOS 72  BILITOT 1.7*  PROT 7.8  ALBUMIN 3.0*   Recent Labs  Lab 05/30/19 1137  LIPASE 32   No results for input(s): AMMONIA in the last 168 hours. Coagulation Profile: No results for input(s): INR, PROTIME in the last 168 hours. Cardiac Enzymes: No results for input(s): CKTOTAL, CKMB, CKMBINDEX, TROPONINI in the last 168 hours. BNP (last 3 results) No results for input(s): PROBNP in the last 8760 hours. HbA1C: No results for input(s): HGBA1C in the last 72 hours. CBG: No results for input(s): GLUCAP in the last  168 hours. Lipid Profile: No results for input(s): CHOL, HDL, LDLCALC, TRIG, CHOLHDL, LDLDIRECT in the last 72 hours. Thyroid Function Tests: No results for input(s): TSH, T4TOTAL, FREET4, T3FREE, THYROIDAB in the last 72 hours. Anemia Panel: No results for input(s): VITAMINB12, FOLATE, FERRITIN, TIBC, IRON, RETICCTPCT in the last 72 hours. Urine analysis:    Component Value Date/Time   COLORURINE AMBER (A) 05/30/2019 1239   APPEARANCEUR HAZY (A) 05/30/2019 1239   LABSPEC 1.021 05/30/2019 1239   PHURINE 5.0 05/30/2019 1239   GLUCOSEU NEGATIVE 05/30/2019 1239   HGBUR NEGATIVE 05/30/2019 1239   BILIRUBINUR NEGATIVE 05/30/2019 1239   BILIRUBINUR n 04/03/2016 0856   KETONESUR NEGATIVE 05/30/2019 1239   PROTEINUR 30 (A) 05/30/2019 1239   UROBILINOGEN negative 04/03/2016  2549   NITRITE NEGATIVE 05/30/2019 1239   LEUKOCYTESUR NEGATIVE 05/30/2019 1239   Sepsis Labs: @LABRCNTIP (procalcitonin:4,lacticidven:4) ) Recent Results (from the past 240 hour(s))  Novel Coronavirus, NAA ([facility])     Status: None   Collection Time: 05/29/19 12:00 AM   Specimen: Nasopharyngeal(NP) swabs in vial transport medium   NASOPHARYNGE  TESTING  Result Value Ref Range Status   SARS-CoV-2, NAA Not Detected Not Detected Final    Comment: This nucleic acid amplification test was developed and its performance characteristics determined by [employer]. Nucleic acid amplification tests include PCR and TMA. This test has not been FDA cleared or approved. This test has been authorized by FDA under an Emergency Use Authorization (EUA). This test is only authorized for the duration of time the declaration that circumstances exist justifying the authorization of the emergency use of in vitro diagnostic tests for detection of SARS-CoV-2 virus and/or diagnosis of COVID-19 infection under section 564(b)(1) of the Act, 21 U.S.C. 826EBR-8(X) (1), unless the authorization is terminated or revoked sooner. When  diagnostic testing is negative, the possibility of a false negative result should be considered in the context of a patient's recent exposures and the presence of clinical signs and symptoms consistent with COVID-19. An individual without symptoms of COVID-19 and who is not shedding SARS-CoV-2 virus would  expect to have a negative (not detected) result in this assay.   SARS CORONAVIRUS 2 (TAT 6-24 HRS) Nasopharyngeal Nasopharyngeal Swab     Status: None   Collection Time: 05/30/19  3:50 PM   Specimen: Nasopharyngeal Swab  Result Value Ref Range Status   SARS Coronavirus 2 NEGATIVE NEGATIVE Final    Comment: (NOTE) SARS-CoV-2 target nucleic acids are NOT DETECTED. The SARS-CoV-2 RNA is generally detectable in upper and lower respiratory specimens during the acute phase of infection. Negative results do not preclude SARS-CoV-2 infection, do not rule out co-infections with other pathogens, and should not be used as the sole basis for treatment or other patient management decisions. Negative results must be combined with clinical observations, patient history, and epidemiological information. The expected result is Negative. Fact Sheet for Patients: SugarRoll.be Fact Sheet for Healthcare Providers: https://www.woods-mathews.com/ This test is not yet approved or cleared by the [place] FDA and  has been authorized for detection and/or diagnosis of SARS-CoV-2 by FDA under an Emergency Use Authorization (EUA). This EUA will remain  in effect (meaning this test can be used) for the duration of the COVID-19 declaration under Section 56 4(b)(1) of the Act, 21 U.S.C. section 360bbb-3(b)(1), unless the authorization is terminated or revoked sooner. Performed at [facility], [place] [address] [place], [place] [address]      Radiological Exams on Admission: Ct Abdomen Pelvis Wo Contrast  Result Date: 05/30/2019 CLINICAL DATA:  Acute  renal failure EXAM: CT ABDOMEN AND PELVIS WITHOUT CONTRAST TECHNIQUE: Multidetector CT imaging of the abdomen and pelvis was performed following the standard protocol without IV contrast. COMPARISON:  None. FINDINGS: Lower chest: There is atelectasis at the lung bases.The heart size is mildly enlarged Hepatobiliary: There is decreased hepatic attenuation suggestive of hepatic steatosis. There is significant pericholecystic fat stranding and free fluid. The gallbladder is mildly distended. There is a 3.2 x 2.3 cm collection abutting the gallbladder fundus and adjacent:. This is best visualized on the coronal series. Pancreas: Normal contours without ductal dilatation. No peripancreatic fluid collection. Spleen: No splenic laceration or hematoma. Adrenals/Urinary Tract: --Adrenal glands: No adrenal hemorrhage. --Right kidney/ureter: No hydronephrosis or perinephric hematoma. --  Left kidney/ureter: No hydronephrosis or perinephric hematoma. --Urinary bladder: Unremarkable. Stomach/Bowel: --Stomach/Duodenum: No hiatal hernia or other gastric abnormality. Normal duodenal course and caliber. --Small bowel: No dilatation or inflammation. --Colon: No focal abnormality. --Appendix: Normal. Vascular/Lymphatic: Atherosclerotic calcification is present within the non-aneurysmal abdominal aorta, without hemodynamically significant stenosis. --No retroperitoneal lymphadenopathy. --No mesenteric lymphadenopathy. --No pelvic or inguinal lymphadenopathy. Reproductive: Unremarkable Other: No ascites or free air. There are bilateral fat containing inguinal hernias. Musculoskeletal. No acute displaced fractures. IMPRESSION: 1. Pericholecystic fat stranding and free fluid, consistent with acute cholecystitis in the appropriate clinical setting. There is a 3.2 x 2.3 cm collection abutting the gallbladder fundus and the colon, raising concern for perforation. This is suboptimally evaluated in the absence of IV contrast. 2. Hepatic  steatosis. 3. Bilateral fat containing inguinal hernias. 4. No hydronephrosis.  No radiopaque obstructing kidney stones. Aortic Atherosclerosis (ICD10-I70.0). Electronically Signed   By: Katherine Mantle M.D.   On: 05/30/2019 20:13   Dg Chest Port 1 View  Result Date: 05/30/2019 CLINICAL DATA:  Shortness of breath. EXAM: PORTABLE CHEST 1 VIEW COMPARISON:  Oct 24, 2016 FINDINGS: Minimal atelectasis in the left base. No pneumothorax. The heart, hila, mediastinum, lungs, and pleura are normal. IMPRESSION: No acute abnormalities. Electronically Signed   By: Gerome Sam III M.D   On: 05/30/2019 14:41    EKG: Independently reviewed.  A. fib with RVR.  Assessment/Plan Principal Problem:   Abdominal pain Active Problems:   Hypertension   Hypothyroid   Acute cholecystitis   ARF (acute renal failure) (HCC)   Atrial fibrillation with RVR (HCC)    1. Acute cholecystitis -General surgery has been consulted.  Patient is on Zosyn n.p.o. IV fluids.  Pain relief medications.  Further recommendation per general surgery. 2. A. fib with RVR appears to be new onset.  Chads 2 vasc score is at least 2 but holding off anticoagulation in anticipation of surgery.  Will check 2D echo thyroid function test troponin.  Will request cardiology consult.  Presently on Cardizem infusion.  Patient is n.p.o. for possible surgery. 3. Acute renal failure likely prerenal cause secondary to nausea vomiting.  Patient is receiving fluids.  No obstruction seen in the CAT scan.  UA is not showing any cast.  I think patient's creatinine will improve with hydration. 4. History of hypothyroidism per chart.  I do not see patient taking any Synthroid.  Will check TSH. 5. Hypertension presently n.p.o. on Cardizem infusion.  Since patient has acute cholecystitis with acute renal failure with A. fib with RVR will need close monitoring for any further deterioration and will need more than 2 midnight stay in inpatient status.   DVT  prophylaxis: SCDs in anticipation of procedure. Code Status: Full code. Family Communication: Discussed with patient. Disposition Plan: Home. Consults called: General surgery.  Cardiology. Admission status: Inpatient.   Eduard Clos MD [facility] Pager [phone].  If 7PM-7AM, please contact night-coverage [email or web address] Password [facility]  05/30/2019, 10:23 PM

## 2019-05-30 NOTE — ED Triage Notes (Signed)
Pt reports right side abd pain, vomiting and dark colored urine that started on Saturday.

## 2019-05-30 NOTE — Consult Note (Signed)
Reason for Consult: Abdominal pain Referring Physician: Dr. Odetta Pink Martin [name] is an 58 y.o. male.  HPI: Patient is a 58 year old male with a history of LVH, hyperlipidemia, hypertension, hypothyroidism, who comes in with a 4-day history of right-sided abdominal pain.  Patient states that the pain was fairly significant.  He states this with associated nausea vomiting today prior to presenting to the ER.  He states that secondary to pain he presented to the ER.  Patient states he is not had previous abdominal pain like this in the past.  Upon evaluation in the ER he underwent CT scan and ultrasound.  Both these were significant for cholecystitis.  Patient had positive Murphy sign on ultrasound.  There is questionable perforation of the gallbladder near the fundus secondary to fluid collection.  Patient did not have IV contrast so difficult to assess.  I did review the CT scan and ultrasound personally.  Patient did have a T bili 1.7.  Other transaminases were within normal limits.  General surgery was consulted for further evaluation and management.  Of note patient has newly diagnosed A. fib, currently on diltiazem drip.  Hold off on heparin drip currently.  Past Medical History:  Diagnosis Date  . Cardiomegaly    LVH  . Cataract   . Hemorrhoids   . Hyperlipidemia   . Hypertension   . Hypothyroidism   . Lower GI bleeding     Past Surgical History:  Procedure Laterality Date  . DOPPLER ECHOCARDIOGRAPHY      Family History  Problem Relation Age of Onset  . Kidney disease Mother   . Hypertension Father   . Kidney disease Father   . Asthma Father     Social History:  reports that he has never smoked. He has never used smokeless tobacco. He reports current alcohol use. He reports that he does not use drugs.  Allergies:  Allergies  Allergen Reactions  . Garlic Swelling  . Shrimp [Shellfish Allergy] Swelling    Medications: I have reviewed the patient's current  medications.  Results for orders placed or performed during the hospital encounter of 05/30/19 (from the past 48 hour(s))  Lipase, blood     Status: None   Collection Time: 05/30/19 11:37 AM  Result Value Ref Range   Lipase 32 11 - 51 U/L    Comment: Performed at [facility], [address], [place], [place] [address]  Comprehensive metabolic panel     Status: Abnormal   Collection Time: 05/30/19 11:37 AM  Result Value Ref Range   Sodium 140 135 - 145 mmol/L   Potassium 4.1 3.5 - 5.1 mmol/L   Chloride 98 98 - 111 mmol/L   CO2 27 22 - 32 mmol/L   Glucose, Bld 130 (H) 70 - 99 mg/dL   BUN 56 (H) 6 - 20 mg/dL   Creatinine, Ser 7.56 (H) 0.61 - 1.24 mg/dL   Calcium 9.1 8.9 - 43.3 mg/dL   Total Protein 7.8 6.5 - 8.1 g/dL   Albumin 3.0 (L) 3.5 - 5.0 g/dL   AST 26 15 - 41 U/L   ALT 35 0 - 44 U/L   Alkaline Phosphatase 72 38 - 126 U/L   Total Bilirubin 1.7 (H) 0.3 - 1.2 mg/dL   GFR calc non Af Amer 16 (L) >60 mL/min   GFR calc Af Amer 19 (L) >60 mL/min   Anion gap 15 5 - 15    Comment: Performed at [facility], [address] [place], [place] [address]  CBC     Status: Abnormal   Collection Time: 05/30/19 11:37 AM  Result Value Ref Range   WBC 17.5 (H) 4.0 - 10.5 K/uL   RBC 4.38 4.22 - 5.81 MIL/uL   Hemoglobin 13.4 13.0 - 17.0 g/dL   HCT 21.341.3 08.639.0 - 57.852.0 %   MCV 94.3 80.0 - 100.0 fL   MCH 30.6 26.0 - 34.0 pg   MCHC 32.4 30.0 - 36.0 g/dL   RDW 46.914.5 62.911.5 - 52.815.5 %   Platelets 276 150 - 400 K/uL   nRBC 0.0 0.0 - 0.2 %    Comment: Performed at [facility], [address] [place], [place] [address]  Urinalysis, Routine w reflex microscopic     Status: Abnormal   Collection Time: 05/30/19 12:39 PM  Result Value Ref Range   Color, Urine AMBER (A) YELLOW    Comment: BIOCHEMICALS MAY BE AFFECTED BY COLOR   APPearance HAZY (A) CLEAR   Specific Gravity, Urine 1.021 1.005 - 1.030   pH 5.0 5.0 - 8.0   Glucose, UA NEGATIVE NEGATIVE mg/dL   Hgb urine dipstick NEGATIVE  NEGATIVE   Bilirubin Urine NEGATIVE NEGATIVE   Ketones, ur NEGATIVE NEGATIVE mg/dL   Protein, ur 30 (A) NEGATIVE mg/dL   Nitrite NEGATIVE NEGATIVE   Leukocytes,Ua NEGATIVE NEGATIVE   RBC / HPF 0-5 0 - 5 RBC/hpf   WBC, UA 0-5 0 - 5 WBC/hpf   Bacteria, UA NONE SEEN NONE SEEN   Squamous Epithelial / LPF 0-5 0 - 5   Mucus PRESENT    Hyaline Casts, UA PRESENT     Comment: Performed at [facility], [address] [place], [place] [address]  SARS CORONAVIRUS 2 (TAT 6-24 HRS) Nasopharyngeal Nasopharyngeal Swab     Status: None   Collection Time: 05/30/19  3:50 PM   Specimen: Nasopharyngeal Swab  Result Value Ref Range   SARS Coronavirus 2 NEGATIVE NEGATIVE    Comment: (NOTE) SARS-CoV-2 target nucleic acids are NOT DETECTED. The SARS-CoV-2 RNA is generally detectable in upper and lower respiratory specimens during the acute phase of infection. Negative results do not preclude SARS-CoV-2 infection, do not rule out co-infections with other pathogens, and should not be used as the sole basis for treatment or other patient management decisions. Negative results must be combined with clinical observations, patient history, and epidemiological information. The expected result is Negative. Fact Sheet for Patients: HairSlick.nohttps://www.fda.gov/media/138098/download Fact Sheet for Healthcare Providers: quierodirigir.comhttps://www.fda.gov/media/138095/download This test is not yet approved or cleared by the [place] FDA and  has been authorized for detection and/or diagnosis of SARS-CoV-2 by FDA under an Emergency Use Authorization (EUA). This EUA will remain  in effect (meaning this test can be used) for the duration of the COVID-19 declaration under Section 56 4(b)(1) of the Act, 21 U.S.C. section 360bbb-3(b)(1), unless the authorization is terminated or revoked sooner. Performed at [facility], [address] [place], [place] [address]   Brain natriuretic peptide     Status: None   Collection  Time: 05/30/19  4:00 PM  Result Value Ref Range   B Natriuretic Peptide 75.9 0.0 - 100.0 pg/mL    Comment: Performed at [facility], [address] [place], [place] [address]    Ct Abdomen Pelvis Wo Contrast  Result Date: 05/30/2019 CLINICAL DATA:  Acute renal failure EXAM: CT ABDOMEN AND PELVIS WITHOUT CONTRAST TECHNIQUE: Multidetector CT imaging of the abdomen and pelvis was  performed following the standard protocol without IV contrast. COMPARISON:  None. FINDINGS: Lower chest: There is atelectasis at the lung bases.The heart size is mildly enlarged Hepatobiliary: There is decreased hepatic attenuation suggestive of hepatic steatosis. There is significant pericholecystic fat stranding and free fluid. The gallbladder is mildly distended. There is a 3.2 x 2.3 cm collection abutting the gallbladder fundus and adjacent:. This is best visualized on the coronal series. Pancreas: Normal contours without ductal dilatation. No peripancreatic fluid collection. Spleen: No splenic laceration or hematoma. Adrenals/Urinary Tract: --Adrenal glands: No adrenal hemorrhage. --Right kidney/ureter: No hydronephrosis or perinephric hematoma. --Left kidney/ureter: No hydronephrosis or perinephric hematoma. --Urinary bladder: Unremarkable. Stomach/Bowel: --Stomach/Duodenum: No hiatal hernia or other gastric abnormality. Normal duodenal course and caliber. --Small bowel: No dilatation or inflammation. --Colon: No focal abnormality. --Appendix: Normal. Vascular/Lymphatic: Atherosclerotic calcification is present within the non-aneurysmal abdominal aorta, without hemodynamically significant stenosis. --No retroperitoneal lymphadenopathy. --No mesenteric lymphadenopathy. --No pelvic or inguinal lymphadenopathy. Reproductive: Unremarkable Other: No ascites or free air. There are bilateral fat containing inguinal hernias. Musculoskeletal. No acute displaced fractures. IMPRESSION: 1. Pericholecystic fat stranding and free  fluid, consistent with acute cholecystitis in the appropriate clinical setting. There is a 3.2 x 2.3 cm collection abutting the gallbladder fundus and the colon, raising concern for perforation. This is suboptimally evaluated in the absence of IV contrast. 2. Hepatic steatosis. 3. Bilateral fat containing inguinal hernias. 4. No hydronephrosis.  No radiopaque obstructing kidney stones. Aortic Atherosclerosis (ICD10-I70.0). Electronically Signed   By: Katherine Mantle M.D.   On: 05/30/2019 20:13   Dg Chest Port 1 View  Result Date: 05/30/2019 CLINICAL DATA:  Shortness of breath. EXAM: PORTABLE CHEST 1 VIEW COMPARISON:  Oct 24, 2016 FINDINGS: Minimal atelectasis in the left base. No pneumothorax. The heart, hila, mediastinum, lungs, and pleura are normal. IMPRESSION: No acute abnormalities. Electronically Signed   By: Gerome Sam III M.D   On: 05/30/2019 14:41   US Abdomen Limited Ruq  Result Date: 05/30/2019 CLINICAL DATA:  Pain. Additional history provided by scanning technologist: Right upper quadrant pain for 2 days. EXAM: ULTRASOUND ABDOMEN LIMITED RIGHT UPPER QUADRANT COMPARISON:  CT abdomen/pelvis performed earlier the same day 05/30/2019 FINDINGS: Gallbladder: Cholecystolithiasis. Gallbladder wall thickening to 5 mm with pericholecystic fluid. Additionally, a sonographic Eulah Pont sign is elicited by the scanning technologist. Common bile duct: Diameter: The visualized common duct is dilated to 8 mm in diameter. Liver: No focal lesion identified. Increased hepatic parenchymal echogenicity. Interrogated portal vein is patent on color Doppler imaging with normal direction of blood flow towards the liver. IMPRESSION: Cholecystolithiasis with gallbladder wall thickening and pericholecystic fluid. Additionally, a sonographic Eulah Pont sign is elicited by the scanning technologist. Findings are consistent with acute cholecystitis in the appropriate clinical setting. Extrahepatic biliary ductal dilatation with  the visualized common duct measuring 8 mm in diameter. Increased hepatic parenchymal echogenicity. This finding is nonspecific, but most commonly seen in the setting of hepatic steatosis. Electronically Signed   By: Jackey Loge DO   On: 05/30/2019 22:29    Review of Systems  Constitutional: Negative for chills, fever and malaise/fatigue.  HENT: Negative for ear discharge, hearing loss and sore throat.   Eyes: Negative for blurred vision and discharge.  Respiratory: Negative for cough and shortness of breath.   Cardiovascular: Negative for chest pain, orthopnea and leg swelling.  Gastrointestinal: Positive for abdominal pain, nausea and vomiting. Negative for constipation, diarrhea and heartburn.  Musculoskeletal: Negative for myalgias and neck pain.  Skin: Negative for itching and rash.  Neurological: Negative for dizziness, focal weakness, seizures and loss of consciousness.  Endo/Heme/Allergies: Negative for environmental allergies. Does not bruise/bleed easily.  Psychiatric/Behavioral: Negative for depression and suicidal ideas.  All other systems reviewed and are negative.  Blood pressure 125/89, pulse (!) 110, temperature 97.8 F (36.6 C), temperature source Oral, resp. rate (!) 28, height 6\' 1"  (1.854 m), weight 122.5 kg, SpO2 92 %. Physical Exam  Constitutional: He is oriented to person, place, and time. Vital signs are normal. He appears well-developed and well-nourished.  Conversant No acute distress  Eyes: Lids are normal. No scleral icterus.  No lid lag Moist conjunctiva  Neck: No tracheal tenderness present. No thyromegaly present.  No cervical lymphadenopathy  Cardiovascular: Normal rate, regular rhythm and intact distal pulses.  No murmur heard. Respiratory: Effort normal and breath sounds normal. He has no wheezes. He has no rales.  GI: There is no hepatosplenomegaly. There is no abdominal tenderness. No hernia.  Neurological: He is alert and oriented to person, place,  and time.  Normal gait and station  Skin: Skin is warm. No rash noted. No cyanosis. Nails show no clubbing.  Normal skin turgor  Psychiatric: Judgment normal.  Appropriate affect    Assessment/Plan: 58 year old male with acute cholecystitis. LVH Hypertension Hyperlipidemia Hypothyroidism  1.  Continue with n.p.o., IV fluids, agree with Zosyn. 2.  We will redraw CMP in a.m. 3.  Patient will likely require lap chole plus minus IOC by Dr. Georgette Dover tomorrow if labs are okay.  Ralene Ok 05/30/2019, 10:59 PM

## 2019-05-30 NOTE — ED Notes (Signed)
Patient transported to Ultrasound 

## 2019-05-30 NOTE — ED Provider Notes (Addendum)
Pt signed out by Dr. Eulis Foster awaiting CT abd/pelvis.  IMPRESSION:  1. Pericholecystic fat stranding and free fluid, consistent with  acute cholecystitis in the appropriate clinical setting. There is a  3.2 x 2.3 cm collection abutting the gallbladder fundus and the  colon, raising concern for perforation. This is suboptimally  evaluated in the absence of IV contrast.  2. Hepatic steatosis.  3. Bilateral fat containing inguinal hernias.  4. No hydronephrosis. No radiopaque obstructing kidney stones.    Aortic Atherosclerosis (ICD10-I70.0).     Pt given 3.375 g Zosyn IV.  Pt remains in afib which looks new.  I have not started heparin as pt may need to go to surgery.  I did speak with Dr. Rosendo Gros who said to hold the heparin for now.  He requested we get an [place] to look at the GB ducts.  He will see pt in consult.  Pt d/w Dr. Hal Hope (triad) for admission.   Isla Pence, MD 05/30/19 [id number]    Isla Pence, MD 05/30/19 2202

## 2019-05-31 ENCOUNTER — Encounter (HOSPITAL_COMMUNITY): Payer: Self-pay | Admitting: Anesthesiology

## 2019-05-31 ENCOUNTER — Inpatient Hospital Stay (HOSPITAL_COMMUNITY): Payer: BC Managed Care – PPO

## 2019-05-31 ENCOUNTER — Encounter (HOSPITAL_COMMUNITY): Payer: Self-pay | Admitting: *Deleted

## 2019-05-31 ENCOUNTER — Encounter (HOSPITAL_COMMUNITY)
Admission: EM | Disposition: A | Discharge status: Home / Self Care | Payer: Self-pay | Source: Home / Self Care | Attending: Family Medicine

## 2019-05-31 DIAGNOSIS — I4891 Unspecified atrial fibrillation: Secondary | ICD-10-CM

## 2019-05-31 DIAGNOSIS — I10 Essential (primary) hypertension: Secondary | ICD-10-CM

## 2019-05-31 DIAGNOSIS — K810 Acute cholecystitis: Secondary | ICD-10-CM

## 2019-05-31 DIAGNOSIS — N179 Acute kidney failure, unspecified: Secondary | ICD-10-CM

## 2019-05-31 DIAGNOSIS — Z01818 Encounter for other preprocedural examination: Secondary | ICD-10-CM

## 2019-05-31 LAB — CBC WITH DIFFERENTIAL/PLATELET
Abs Immature Granulocytes: 0.06 10*3/uL (ref 0.00–0.07)
Basophils Absolute: 0 10*3/uL (ref 0.0–0.1)
Basophils Relative: 0 %
Eosinophils Absolute: 0.1 10*3/uL (ref 0.0–0.5)
Eosinophils Relative: 1 %
HCT: 40.4 % (ref 39.0–52.0)
Hemoglobin: 12.6 g/dL — ABNORMAL LOW (ref 13.0–17.0)
Immature Granulocytes: 1 %
Lymphocytes Relative: 12 %
Lymphs Abs: 1.6 10*3/uL (ref 0.7–4.0)
MCH: 30.5 pg (ref 26.0–34.0)
MCHC: 31.2 g/dL (ref 30.0–36.0)
MCV: 97.8 fL (ref 80.0–100.0)
Monocytes Absolute: 1 10*3/uL (ref 0.1–1.0)
Monocytes Relative: 8 %
Neutro Abs: 10.3 10*3/uL — ABNORMAL HIGH (ref 1.7–7.7)
Neutrophils Relative %: 78 %
Platelets: 270 10*3/uL (ref 150–400)
RBC: 4.13 MIL/uL — ABNORMAL LOW (ref 4.22–5.81)
RDW: 15.2 % (ref 11.5–15.5)
WBC: 13.1 10*3/uL — ABNORMAL HIGH (ref 4.0–10.5)
nRBC: 0 % (ref 0.0–0.2)

## 2019-05-31 LAB — BASIC METABOLIC PANEL
Anion gap: 13 (ref 5–15)
BUN: 69 mg/dL — ABNORMAL HIGH (ref 6–20)
CO2: 22 mmol/L (ref 22–32)
Calcium: 8.5 mg/dL — ABNORMAL LOW (ref 8.9–10.3)
Chloride: 105 mmol/L (ref 98–111)
Creatinine, Ser: 2.28 mg/dL — ABNORMAL HIGH (ref 0.61–1.24)
GFR calc Af Amer: 35 mL/min — ABNORMAL LOW (ref >60)
GFR calc non Af Amer: 30 mL/min — ABNORMAL LOW (ref >60)
Glucose, Bld: 137 mg/dL — ABNORMAL HIGH (ref 70–99)
Potassium: 4 mmol/L (ref 3.5–5.1)
Sodium: 140 mmol/L (ref 135–145)

## 2019-05-31 LAB — ECHOCARDIOGRAM COMPLETE
Height: 73 in
Weight: 4320 oz

## 2019-05-31 LAB — TSH: TSH: 2.498 u[IU]/mL (ref 0.350–4.500)

## 2019-05-31 LAB — HEPATIC FUNCTION PANEL
ALT: 34 U/L (ref 0–44)
AST: 29 U/L (ref 15–41)
Albumin: 2.8 g/dL — ABNORMAL LOW (ref 3.5–5.0)
Alkaline Phosphatase: 75 U/L (ref 38–126)
Bilirubin, Direct: 0.3 mg/dL — ABNORMAL HIGH (ref 0.0–0.2)
Indirect Bilirubin: 1.2 mg/dL — ABNORMAL HIGH (ref 0.3–0.9)
Total Bilirubin: 1.5 mg/dL — ABNORMAL HIGH (ref 0.3–1.2)
Total Protein: 7 g/dL (ref 6.5–8.1)

## 2019-05-31 LAB — GLUCOSE, CAPILLARY: Glucose-Capillary: 136 mg/dL — ABNORMAL HIGH (ref 70–99)

## 2019-05-31 LAB — HIV ANTIBODY (ROUTINE TESTING W REFLEX): HIV Screen 4th Generation wRfx: NONREACTIVE

## 2019-05-31 LAB — TROPONIN I (HIGH SENSITIVITY)
Troponin I (High Sensitivity): 8 ng/L (ref <18)
Troponin I (High Sensitivity): 10 ng/L (ref <18)

## 2019-05-31 SURGERY — LAPAROSCOPIC CHOLECYSTECTOMY WITH INTRAOPERATIVE CHOLANGIOGRAM
Anesthesia: General

## 2019-05-31 MED ORDER — MUPIROCIN 2 % EX OINT: 1.0000 "application " | TOPICAL_OINTMENT | Freq: Two times a day (BID) | CUTANEOUS | Status: DC

## 2019-05-31 MED ORDER — HEPARIN SODIUM (PORCINE) 5000 UNIT/ML IJ SOLN
5000.0000 [IU] | Freq: Three times a day (TID) | INTRAMUSCULAR | Status: AC
  Administered 2019-05-31 (×2): 5000 [IU] via SUBCUTANEOUS
  Filled 2019-05-31 (×2): qty 1

## 2019-05-31 NOTE — Anesthesia Preprocedure Evaluation (Deleted)
Anesthesia Evaluation  Patient identified by MRN, date of birth, ID band Patient awake    Reviewed: Allergy & Precautions, NPO status , Patient's Chart, lab work & pertinent test results, reviewed documented beta blocker date and time   Airway Mallampati: II  TM Distance: >3 FB Neck ROM: Full    Dental no notable dental hx.    Pulmonary neg pulmonary ROS,    Pulmonary exam normal breath sounds clear to auscultation       Cardiovascular hypertension, Pt. on medications and Pt. on home beta blockers Normal cardiovascular exam+ dysrhythmias Atrial Fibrillation  Rhythm:Regular Rate:Normal  Was in Afib with RVR on admission, converted with diltiazem- on infusion now   Neuro/Psych negative neurological ROS  negative psych ROS   GI/Hepatic Neg liver ROS, Acute cholecysitis   Endo/Other  Hypothyroidism   Renal/GU ARFRenal diseaseCr 2.3- thought to be AKI 2/2 N/V with acute cholecystitis  negative genitourinary   Musculoskeletal negative musculoskeletal ROS (+)   Abdominal Normal abdominal exam  (+)   Peds negative pediatric ROS (+)  Hematology negative hematology ROS (+)   Anesthesia Other Findings HLD  Reproductive/Obstetrics negative OB ROS                            Anesthesia Physical Anesthesia Plan  ASA: III  Anesthesia Plan: General   Post-op Pain Management:    Induction: Intravenous  PONV Risk Score and Plan: 2 and Ondansetron, Dexamethasone, Treatment may vary due to age or medical condition and Midazolam  Airway Management Planned: Oral ETT  Additional Equipment: None  Intra-op Plan:   Post-operative Plan: Extubation in OR  Informed Consent: I have reviewed the patients History and Physical, chart, labs and discussed the procedure including the risks, benefits and alternatives for the proposed anesthesia with the patient or authorized representative who has indicated  his/her understanding and acceptance.     Dental advisory given  Plan Discussed with: CRNA  Anesthesia Plan Comments:         Anesthesia Quick Evaluation

## 2019-05-31 NOTE — Progress Notes (Addendum)
CC: Abdominal pain, vomiting, and dark-colored urine  Subjective: Still tender RUQ, no further nausea and vomiting.  He has been seen by Cardiology and is cleared for surgery.  He will need anticoagulation for his AF post op.  Objective: Vital signs in last 24 hours: Temp:  [97.8 F (36.6 C)-98.4 F (36.9 C)] 98.4 F (36.9 C) (12/09 0116) Pulse Rate:  [50-121] 96 (12/09 0715) Resp:  [14-29] 18 (12/09 0715) BP: (99-131)/(57-94) 120/94 (12/09 0715) SpO2:  [92 %-97 %] 96 % (12/09 0715) Weight:  [122.5 kg] 122.5 kg (12/08 2200)  Afebrile tachycardic blood pressure stable Creatinine 3.75 >> 2.28 Total bilirubin 1.5 WBC 13.1 CT scan: Pericholecystic fat stranding and free fluid consistent with acute cholecystitis 3.2 x 2.3 cm collection abutting the gallbladder fundus questionable perforation/abscess.  Hepatic steatosis, bilateral fat-containing inguinal hernias. Ultrasound: Coley lithiasis, gallbladder wall thickening to 5 mm pericholecystic fluid CBD to 8 mm.  Intake/Output from previous day: 12/08 0701 - 12/09 0700 In: 1100 [IV Piggyback:1100] Out: -  Intake/Output this shift: Total I/O In: 51.3 [IV Piggyback:51.3] Out: 300 [Urine:300]  General appearance: alert, cooperative and no distress Resp: clear to auscultation bilaterally and BS down in bases GI: Tender RUQ  Lab Results:  Recent Labs    05/30/19 1137 05/31/19 0403  WBC 17.5* 13.1*  HGB 13.4 12.6*  HCT 41.3 40.4  PLT 276 270    BMET Recent Labs    05/30/19 1137 05/31/19 0403  NA 140 140  K 4.1 4.0  CL 98 105  CO2 27 22  GLUCOSE 130* 137*  BUN 56* 69*  CREATININE 3.75* 2.28*  CALCIUM 9.1 8.5*   PT/INR No results for input(s): LABPROT, INR in the last 72 hours.  Recent Labs  Lab 05/30/19 1137 05/31/19 0403  AST 26 29  ALT 35 34  ALKPHOS 72 75  BILITOT 1.7* 1.5*  PROT 7.8 7.0  ALBUMIN 3.0* 2.8*     Lipase     Component Value Date/Time   LIPASE 32 05/30/2019 1137   Prior to  Admission medications   Medication Sig Start Date End Date Taking? Authorizing Provider  amLODipine (NORVASC) 10 MG tablet Take 1 tablet (10 mg total) by mouth daily. 10/06/17  Yes [name], MD  aspirin 81 MG tablet Take 81 mg by mouth daily.     Yes [provider]  cetirizine (ZYRTEC ALLERGY) 10 MG tablet Take 1 tablet (10 mg total) by mouth daily. Patient taking differently: Take 10 mg by mouth daily as needed for allergies.  09/14/16  Yes Kirichenko, Tatyana, PA-C  hydrochlorothiazide (HYDRODIURIL) 25 MG tablet Take 25 mg by mouth daily. 03/28/19  Yes [provider]  losartan (COZAAR) 100 MG tablet Take 100 mg by mouth daily. 03/28/19  Yes [provider]  metoprolol tartrate (LOPRESSOR) 50 MG tablet Take 50 mg by mouth 2 (two) times daily. 03/28/19  Yes [provider]  pravastatin (PRAVACHOL) 40 MG tablet Take 1 tablet (40 mg total) by mouth daily. 10/06/17  Yes [name], MD  sildenafil (REVATIO) 20 MG tablet Take 1 to 5 pills as needed Patient taking differently: Take 20-120 mg by mouth daily as needed (ED).  07/06/16  Yes [name], MD  traMADol (ULTRAM) 50 MG tablet Take 50 mg by mouth every 6 (six) hours as needed for moderate pain.   Yes [provider]  azithromycin (ZITHROMAX Z-PAK) 250 MG tablet Take 2 tabs today and 1 tab for the next 4  days Patient not taking: Reported on 10/29/2016 10/24/16   Michela Pitcher A, PA-C  benzonatate (TESSALON) 100 MG capsule Take 1 capsule (100 mg total) by mouth every 8 (eight) hours. Patient not taking: Reported on 10/29/2016 09/14/16   Jaynie Crumble, PA-C  losartan-hydrochlorothiazide (HYZAAR) 100-25 MG tablet Take 1 tablet by mouth daily. Patient not taking: Reported on 05/30/2019 10/06/17   Ronnald Nian, MD  metoprolol succinate (TOPROL-XL) 100 MG 24 hr tablet TAKE 1 TABLET BY MOUTH ONCE DAILY WITH  OR  IMMEDIATELY  FOLLOWING  A  MEAL Patient not taking: Reported on 05/30/2019 10/06/17    Ronnald Nian, MD  triamcinolone cream (KENALOG) 0.5 % Apply 1 application topically 3 (three) times daily. Patient not taking: Reported on 05/30/2019 01/21/14   Morrell Riddle, PA-C      Medications:   . sodium chloride 125 mL/hr at 05/31/19 0149  . diltiazem (CARDIZEM) infusion 15 mg/hr (05/31/19 0149)  . piperacillin-tazobactam (ZOSYN)  IV Stopped (05/31/19 0737)    Assessment/Plan LVH CKD creatinine 3.75 >>2.28 Hypertension Hypothyroid Hyperlipidemia Atrial fibrillation with RVR - new - will need anticoagulation post op.  Acute cholecystitis  FEN: IV fluids/n.p.o. ID: Zosyn 12/8 >> day 2 DVT: SCDs Follow-up: [facility]  Plan:  For surgery later today.  Cardiology has said he can go forward with surgery from their standpoint.  Surgery postponed due to emergency surgery.  Will give him some clears tonight, NPO after MN, continue Zosyn, and aim for surgery tomorrow.   LOS: 1 day    Delania Ferg 05/31/2019 Please see Amion

## 2019-05-31 NOTE — ED Notes (Signed)
ED TO INPATIENT HANDOFF REPORT  ED Nurse Name and Phone #: Lenord Carbo, Call ED  S Name/Age/Gender [facility] 58 y.o. male Room/Bed: 005C/005C  Code Status   Code Status: Full Code  Home/SNF/Other Home Patient oriented to: self, place, time and situation Is this baseline? Yes   Triage Complete: Triage complete  Chief Complaint vomiting/poss blood in urine  Triage Note Pt reports right side abd pain, vomiting and dark colored urine that started on Saturday.    Allergies Allergies  Allergen Reactions  . Garlic Swelling  . Shrimp [Shellfish Allergy] Swelling    Level of Care/Admitting Diagnosis ED Disposition    ED Disposition Condition Comment   Admit  Hospital Area: [facility] [[id number]]  Level of Care: Progressive [102]  Admit to Progressive based on following criteria: MULTISYSTEM THREATS such as stable sepsis, metabolic/electrolyte imbalance with or without encephalopathy that is responding to early treatment.  Covid Evaluation: Confirmed COVID Negative  Diagnosis: Abdominal pain [[id number]]  Admitting Physician: Eduard Clos [[id number]]  Attending Physician: Eduard Clos [phone]  Estimated length of stay: past midnight tomorrow  Certification:: I certify this patient will need inpatient services for at least 2 midnights  PT Class (Do Not Modify): Inpatient [101]  PT Acc Code (Do Not Modify): Private [1]       B Medical/Surgery History Past Medical History:  Diagnosis Date  . Cardiomegaly    LVH  . Cataract   . Hemorrhoids   . Hyperlipidemia   . Hypertension   . Hypothyroidism   . Lower GI bleeding    Past Surgical History:  Procedure Laterality Date  . DOPPLER ECHOCARDIOGRAPHY       A IV Location/Drains/Wounds Patient Lines/Drains/Airways Status   Active Line/Drains/Airways    Name:   Placement date:   Placement time:   Site:   Days:   Peripheral IV 05/30/19 Right Antecubital   05/30/19    2100    Antecubital   1    Peripheral IV 05/31/19 Left Antecubital   05/31/19    0327    Antecubital   less than 1          Intake/Output Last 24 hours  Intake/Output Summary (Last 24 hours) at 05/31/2019 1629 Last data filed at 05/31/2019 0744 Gross per 24 hour  Intake 151.27 ml  Output 300 ml  Net -148.73 ml    Labs/Imaging Results for orders placed or performed during the hospital encounter of 05/30/19 (from the past 48 hour(s))  Lipase, blood     Status: None   Collection Time: 05/30/19 11:37 AM  Result Value Ref Range   Lipase 32 11 - 51 U/L    Comment: Performed at [facility], [address] [place], [place] [address]  Comprehensive metabolic panel     Status: Abnormal   Collection Time: 05/30/19 11:37 AM  Result Value Ref Range   Sodium 140 135 - 145 mmol/L   Potassium 4.1 3.5 - 5.1 mmol/L   Chloride 98 98 - 111 mmol/L   CO2 27 22 - 32 mmol/L   Glucose, Bld 130 (H) 70 - 99 mg/dL   BUN 56 (H) 6 - 20 mg/dL   Creatinine, Ser 4.85 (H) 0.61 - 1.24 mg/dL   Calcium 9.1 8.9 - 46.2 mg/dL   Total Protein 7.8 6.5 - 8.1 g/dL   Albumin 3.0 (L) 3.5 - 5.0 g/dL   AST 26 15 - 41 U/L   ALT 35 0 - 44 U/L  Alkaline Phosphatase 72 38 - 126 U/L   Total Bilirubin 1.7 (H) 0.3 - 1.2 mg/dL   GFR calc non Af Amer 16 (L) >60 mL/min   GFR calc Af Amer 19 (L) >60 mL/min   Anion gap 15 5 - 15    Comment: Performed at [facility], [address] [place], [place] [address]  CBC     Status: Abnormal   Collection Time: 05/30/19 11:37 AM  Result Value Ref Range   WBC 17.5 (H) 4.0 - 10.5 K/uL   RBC 4.38 4.22 - 5.81 MIL/uL   Hemoglobin 13.4 13.0 - 17.0 g/dL   HCT 60.4 54.0 - 98.1 %   MCV 94.3 80.0 - 100.0 fL   MCH 30.6 26.0 - 34.0 pg   MCHC 32.4 30.0 - 36.0 g/dL   RDW 19.1 47.8 - 29.5 %   Platelets 276 150 - 400 K/uL   nRBC 0.0 0.0 - 0.2 %    Comment: Performed at [facility], [address], [place], [place] [address]  Urinalysis, Routine w reflex microscopic     Status: Abnormal    Collection Time: 05/30/19 12:39 PM  Result Value Ref Range   Color, Urine AMBER (A) YELLOW    Comment: BIOCHEMICALS MAY BE AFFECTED BY COLOR   APPearance HAZY (A) CLEAR   Specific Gravity, Urine 1.021 1.005 - 1.030   pH 5.0 5.0 - 8.0   Glucose, UA NEGATIVE NEGATIVE mg/dL   Hgb urine dipstick NEGATIVE NEGATIVE   Bilirubin Urine NEGATIVE NEGATIVE   Ketones, ur NEGATIVE NEGATIVE mg/dL   Protein, ur 30 (A) NEGATIVE mg/dL   Nitrite NEGATIVE NEGATIVE   Leukocytes,Ua NEGATIVE NEGATIVE   RBC / HPF 0-5 0 - 5 RBC/hpf   WBC, UA 0-5 0 - 5 WBC/hpf   Bacteria, UA NONE SEEN NONE SEEN   Squamous Epithelial / LPF 0-5 0 - 5   Mucus PRESENT    Hyaline Casts, UA PRESENT     Comment: Performed at [facility], [address], [place], [place] [address]  SARS CORONAVIRUS 2 (TAT 6-24 HRS) Nasopharyngeal Nasopharyngeal Swab     Status: None   Collection Time: 05/30/19  3:50 PM   Specimen: Nasopharyngeal Swab  Result Value Ref Range   SARS Coronavirus 2 NEGATIVE NEGATIVE    Comment: (NOTE) SARS-CoV-2 target nucleic acids are NOT DETECTED. The SARS-CoV-2 RNA is generally detectable in upper and lower respiratory specimens during the acute phase of infection. Negative results do not preclude SARS-CoV-2 infection, do not rule out co-infections with other pathogens, and should not be used as the sole basis for treatment or other patient management decisions. Negative results must be combined with clinical observations, patient history, and epidemiological information. The expected result is Negative. Fact Sheet for Patients: HairSlick.no Fact Sheet for Healthcare Providers: quierodirigir.com This test is not yet approved or cleared by the [place] FDA and  has been authorized for detection and/or diagnosis of SARS-CoV-2 by FDA under an Emergency Use Authorization (EUA). This EUA will remain  in effect (meaning this test can be used) for  the duration of the COVID-19 declaration under Section 56 4(b)(1) of the Act, 21 U.S.C. section 360bbb-3(b)(1), unless the authorization is terminated or revoked sooner. Performed at [facility], [address], [place], [place] [address]   Brain natriuretic peptide     Status: None   Collection Time: 05/30/19  4:00 PM  Result Value Ref Range   B Natriuretic  Peptide 75.9 0.0 - 100.0 pg/mL    Comment: Performed at [facility], [place] [address] [place], [place] [address]  HIV Antibody (routine testing w rflx)     Status: None   Collection Time: 05/31/19  4:03 AM  Result Value Ref Range   HIV Screen 4th Generation wRfx NON REACTIVE NON REACTIVE    Comment: Performed at [facility] [address], [place], [place] [address]  Basic metabolic panel     Status: Abnormal   Collection Time: 05/31/19  4:03 AM  Result Value Ref Range   Sodium 140 135 - 145 mmol/L   Potassium 4.0 3.5 - 5.1 mmol/L   Chloride 105 98 - 111 mmol/L   CO2 22 22 - 32 mmol/L   Glucose, Bld 137 (H) 70 - 99 mg/dL   BUN 69 (H) 6 - 20 mg/dL   Creatinine, Ser 2.28 (H) 0.61 - 1.24 mg/dL    Comment: DELTA CHECK NOTED   Calcium 8.5 (L) 8.9 - 10.3 mg/dL   GFR calc non Af Amer 30 (L) >60 mL/min   GFR calc Af Amer 35 (L) >60 mL/min   Anion gap 13 5 - 15    Comment: Performed at [facility] [address] [place], [place] [address]  Hepatic function panel     Status: Abnormal   Collection Time: 05/31/19  4:03 AM  Result Value Ref Range   Total Protein 7.0 6.5 - 8.1 g/dL   Albumin 2.8 (L) 3.5 - 5.0 g/dL   AST 29 15 - 41 U/L   ALT 34 0 - 44 U/L   Alkaline Phosphatase 75 38 - 126 U/L   Total Bilirubin 1.5 (H) 0.3 - 1.2 mg/dL   Bilirubin, Direct 0.3 (H) 0.0 - 0.2 mg/dL   Indirect Bilirubin 1.2 (H) 0.3 - 0.9 mg/dL    Comment: Performed at [facility] [address], [place], [place] [address]  CBC WITH DIFFERENTIAL     Status: Abnormal   Collection Time: 05/31/19  4:03 AM  Result  Value Ref Range   WBC 13.1 (H) 4.0 - 10.5 K/uL   RBC 4.13 (L) 4.22 - 5.81 MIL/uL   Hemoglobin 12.6 (L) 13.0 - 17.0 g/dL   HCT 40.4 39.0 - 52.0 %   MCV 97.8 80.0 - 100.0 fL   MCH 30.5 26.0 - 34.0 pg   MCHC 31.2 30.0 - 36.0 g/dL   RDW 15.2 11.5 - 15.5 %   Platelets 270 150 - 400 K/uL   nRBC 0.0 0.0 - 0.2 %   Neutrophils Relative % 78 %   Neutro Abs 10.3 (H) 1.7 - 7.7 K/uL   Lymphocytes Relative 12 %   Lymphs Abs 1.6 0.7 - 4.0 K/uL   Monocytes Relative 8 %   Monocytes Absolute 1.0 0.1 - 1.0 K/uL   Eosinophils Relative 1 %   Eosinophils Absolute 0.1 0.0 - 0.5 K/uL   Basophils Relative 0 %   Basophils Absolute 0.0 0.0 - 0.1 K/uL   Immature Granulocytes 1 %   Abs Immature Granulocytes 0.06 0.00 - 0.07 K/uL    Comment: Performed at [facility], [place] [address], [place], [place] [address]  TSH     Status: None   Collection Time: 05/31/19  7:27 AM  Result Value Ref Range   TSH 2.498 0.350 - 4.500 uIU/mL    Comment: Performed by a 3rd Generation assay with a functional sensitivity of <=0.01 uIU/mL. Performed  at [facility], [address], [place], [place] [address]   Troponin I (High Sensitivity)     Status: None   Collection Time: 05/31/19  7:27 AM  Result Value Ref Range   Troponin I (High Sensitivity) 8 <18 ng/L    Comment: (NOTE) Elevated high sensitivity troponin I (hsTnI) values and significant  changes across serial measurements may suggest ACS but many other  chronic and acute conditions are known to elevate hsTnI results.  Refer to the "Links" section for chest pain algorithms and additional  guidance. Performed at [facility], [address], [place], [place] [address]   Troponin I (High Sensitivity)     Status: None   Collection Time: 05/31/19  1:14 PM  Result Value Ref Range   Troponin I (High Sensitivity) 10 <18 ng/L    Comment: (NOTE) Elevated high sensitivity troponin I (hsTnI) values and significant  changes across serial measurements may  suggest ACS but many other  chronic and acute conditions are known to elevate hsTnI results.  Refer to the "Links" section for chest pain algorithms and additional  guidance. Performed at [facility], [address] [place], [place] [address]    Ct Abdomen Pelvis Wo Contrast  Result Date: 05/30/2019 CLINICAL DATA:  Acute renal failure EXAM: CT ABDOMEN AND PELVIS WITHOUT CONTRAST TECHNIQUE: Multidetector CT imaging of the abdomen and pelvis was performed following the standard protocol without IV contrast. COMPARISON:  None. FINDINGS: Lower chest: There is atelectasis at the lung bases.The heart size is mildly enlarged Hepatobiliary: There is decreased hepatic attenuation suggestive of hepatic steatosis. There is significant pericholecystic fat stranding and free fluid. The gallbladder is mildly distended. There is a 3.2 x 2.3 cm collection abutting the gallbladder fundus and adjacent:. This is best visualized on the coronal series. Pancreas: Normal contours without ductal dilatation. No peripancreatic fluid collection. Spleen: No splenic laceration or hematoma. Adrenals/Urinary Tract: --Adrenal glands: No adrenal hemorrhage. --Right kidney/ureter: No hydronephrosis or perinephric hematoma. --Left kidney/ureter: No hydronephrosis or perinephric hematoma. --Urinary bladder: Unremarkable. Stomach/Bowel: --Stomach/Duodenum: No hiatal hernia or other gastric abnormality. Normal duodenal course and caliber. --Small bowel: No dilatation or inflammation. --Colon: No focal abnormality. --Appendix: Normal. Vascular/Lymphatic: Atherosclerotic calcification is present within the non-aneurysmal abdominal aorta, without hemodynamically significant stenosis. --No retroperitoneal lymphadenopathy. --No mesenteric lymphadenopathy. --No pelvic or inguinal lymphadenopathy. Reproductive: Unremarkable Other: No ascites or free air. There are bilateral fat containing inguinal hernias. Musculoskeletal. No acute displaced  fractures. IMPRESSION: 1. Pericholecystic fat stranding and free fluid, consistent with acute cholecystitis in the appropriate clinical setting. There is a 3.2 x 2.3 cm collection abutting the gallbladder fundus and the colon, raising concern for perforation. This is suboptimally evaluated in the absence of IV contrast. 2. Hepatic steatosis. 3. Bilateral fat containing inguinal hernias. 4. No hydronephrosis.  No radiopaque obstructing kidney stones. Aortic Atherosclerosis (ICD10-I70.0). Electronically Signed   By: Katherine Mantle M.D.   On: 05/30/2019 20:13   Dg Chest Port 1 View  Result Date: 05/30/2019 CLINICAL DATA:  Shortness of breath. EXAM: PORTABLE CHEST 1 VIEW COMPARISON:  Oct 24, 2016 FINDINGS: Minimal atelectasis in the left base. No pneumothorax. The heart, hila, mediastinum, lungs, and pleura are normal. IMPRESSION: No acute abnormalities. Electronically Signed   By: Gerome Sam III M.D   On: 05/30/2019 14:41   US Abdomen Limited Ruq  Result Date: 05/30/2019 CLINICAL DATA:  Pain. Additional history provided by scanning technologist: Right upper quadrant pain for 2 days. EXAM: ULTRASOUND  ABDOMEN LIMITED RIGHT UPPER QUADRANT COMPARISON:  CT abdomen/pelvis performed earlier the same day 05/30/2019 FINDINGS: Gallbladder: Cholecystolithiasis. Gallbladder wall thickening to 5 mm with pericholecystic fluid. Additionally, a sonographic Eulah PontMurphy sign is elicited by the scanning technologist. Common bile duct: Diameter: The visualized common duct is dilated to 8 mm in diameter. Liver: No focal lesion identified. Increased hepatic parenchymal echogenicity. Interrogated portal vein is patent on color Doppler imaging with normal direction of blood flow towards the liver. IMPRESSION: Cholecystolithiasis with gallbladder wall thickening and pericholecystic fluid. Additionally, a sonographic Eulah PontMurphy sign is elicited by the scanning technologist. Findings are consistent with acute cholecystitis in the  appropriate clinical setting. Extrahepatic biliary ductal dilatation with the visualized common duct measuring 8 mm in diameter. Increased hepatic parenchymal echogenicity. This finding is nonspecific, but most commonly seen in the setting of hepatic steatosis. Electronically Signed   By: Jackey LogeKyle  Golden DO   On: 05/30/2019 22:29    Pending Labs Unresulted Labs (From admission, onward)    Start     Ordered   06/01/19 0500  CBC with Differential/Platelet  Tomorrow morning,   R     05/31/19 1115   06/01/19 0500  Magnesium  Tomorrow morning,   R     05/31/19 1115   06/01/19 0500  CBC  Tomorrow morning,   R     05/31/19 1504   06/01/19 0500  Comprehensive metabolic panel  Tomorrow morning,   R     05/31/19 1504   06/01/19 0500  Lipase, blood am  Tomorrow morning,   R     05/31/19 1504          Vitals/Pain Today's Vitals   05/31/19 1430 05/31/19 1500 05/31/19 1530 05/31/19 1600  BP: 124/87 131/88 (!) 129/102 119/84  Pulse: 86 (!) 108    Resp: (!) 24 (!) 28 (!) 25 (!) 21  Temp:      TempSrc:      SpO2: 96% 95%    Weight:      Height:      PainSc:        Isolation Precautions No active isolations  Medications Medications  diltiazem (CARDIZEM) 1 mg/mL load via infusion 10 mg (10 mg Intravenous Bolus from Bag 05/30/19 1513)    And  diltiazem (CARDIZEM) 125 mg in dextrose 5% 125 mL (1 mg/mL) infusion (5 mg/hr Intravenous New Bag/Given 05/31/19 1329)  acetaminophen (TYLENOL) tablet 650 mg (has no administration in time range)    Or  acetaminophen (TYLENOL) suppository 650 mg (has no administration in time range)  ondansetron (ZOFRAN) tablet 4 mg (has no administration in time range)    Or  ondansetron (ZOFRAN) injection 4 mg (has no administration in time range)  morphine 2 MG/ML injection 1 mg (has no administration in time range)  0.9 %  sodium chloride infusion ( Intravenous New Bag/Given 05/31/19 1310)  piperacillin-tazobactam (ZOSYN) IVPB 3.375 g (3.375 g Intravenous New  Bag/Given 05/31/19 1308)  heparin injection 5,000 Units (5,000 Units Subcutaneous Given 05/31/19 1544)  sodium chloride flush (NS) 0.9 % injection 3 mL (3 mLs Intravenous Given 05/30/19 1326)  sodium chloride 0.9 % bolus 1,000 mL (0 mLs Intravenous Stopped 05/30/19 1553)  piperacillin-tazobactam (ZOSYN) IVPB 3.375 g (0 g Intravenous Stopped 05/30/19 2316)    Mobility walks Low fall risk   Focused Assessments Gastrointestinal   R Recommendations: See Admitting Provider Note  Report given to:   Additional Notes: N/A

## 2019-05-31 NOTE — Progress Notes (Signed)
Pharmacy Antibiotic Note  Martin [name] is a 58 y.o. male admitted on 05/30/2019 with abdominal pain.  Pharmacy has been consulted for Zosyn dosing. WBC elevated. Noted renal dysfunction.   Plan Zosyn 3.375G IV q8h to be infused over 4 hours Trend WBC, temp, renal function  Height: 6\' 1"  (185.4 cm) Weight: 270 lb (122.5 kg) IBW/kg (Calculated) : 79.9  Temp (24hrs), Avg:98.1 F (36.7 C), Min:97.8 F (36.6 C), Max:98.4 F (36.9 C)  Recent Labs  Lab 05/30/19 1137  WBC 17.5*  CREATININE 3.75*    Estimated Creatinine Clearance: 28.7 mL/min (A) (by C-G formula based on SCr of 3.75 mg/dL (H)).    Allergies  Allergen Reactions  . Garlic Swelling  . Shrimp [Shellfish Allergy] Swelling   Narda Bonds, PharmD, BCPS Clinical Pharmacist Phone: [phone]

## 2019-05-31 NOTE — Progress Notes (Signed)
[name]  PROGRESS NOTE    [name]  TDD:[id number] DOB: 1960-12-20 DOA: 05/30/2019 PCP: [name], MD   Brief Narrative:   Martin [name] is a 58 y.o. male with history of hypertension and hyperlipidemia presents to the ER with complaints of abdominal pain with nausea vomiting over the last 2 days.  Pain is mostly in the right upper quadrant.  Denies any diarrhea.  Pain is constant irrespective of the diet.  Denies any blood in the vomitus.  Denies any chest pain or shortness of breath.  05/31/19:  Says he still has ab pain. Says he's ready for surgery. Denies any other complaints.    Assessment & Plan:   Principal Problem:   Abdominal pain Active Problems:   Hypertension   Hypothyroid   Acute cholecystitis   ARF (acute renal failure) (HCC)   Atrial fibrillation with RVR (HCC)  Acute cholecystitis      - zosyn, pain meds     - gen surg has seen, on schedule for removal; appreciate assistance  A. fib with RVR     - new onset.       - cards consulted, appreciate assistance     - per cards: His CHADS2VASC score is 1 and therefore will not need longterm anticoagulation once NSR has been restored. Will start Eliquis 5mg  BID post op when ok with surgery and continue for 4 weeks and if remains in afib will plan DCCV then and keep on anticoagulation 4 additional weeks post CV.      - currently on cardizem gtt; continue for now  Acute renal failure     - likely prerenal cause secondary to nausea vomiting      - No obstruction seen on CT scan       - UA w/o casts     - SCr improved, but BUN up, monitor for now and continue fluids  History of hypothyroidism     - TSH 2.49; monitor, follow up outpt  Hypertension     - on dilt gtt; BP ok     - home meds: hyzaar, lopressor, norvasc     - currently NPO; resume lopressor, norvasc when allowed to take PO  DVT prophylaxis: SCDs Code Status: FULL   Disposition Plan: TBD  Consultants:   General Surgery  Cardiology   Antimicrobials:  . Zosyn   ROS:  Denies CP, N, V. Reports RUQ ab pain . Remainder 10-pt ROS is negative for all not previously mentioned.  Subjective: "They told me it'll be this afternoon."  Objective: Vitals:   05/31/19 0845 05/31/19 0900 05/31/19 0915 05/31/19 0930  BP: 107/74 114/82 112/79 (!) 120/92  Pulse: 95 90 94 97  Resp: (!) 22 20 (!) 23 (!) 26  Temp:      TempSrc:      SpO2: 95% 96% 96% 94%  Weight:      Height:        Intake/Output Summary (Last 24 hours) at 05/31/2019 1038 Last data filed at 05/31/2019 0744 Gross per 24 hour  Intake 1151.27 ml  Output 300 ml  Net 851.27 ml   Filed Weights   05/30/19 2200  Weight: 122.5 kg    Examination:  General: 58 y.o. male resting in bed in NAD Cardiovascular: RRR, +S1, S2, no m/g/r, equal pulses throughout Respiratory: CTABL, no w/r/r, normal WOB GI: BS+, ND, RUQ TTP, no masses noted, no organomegaly noted MSK: No e/c/c Skin: No rashes, bruises, ulcerations noted Neuro: A&O x 3, no  focal deficits Psyc: Appropriate interaction and affect, calm/cooperative   Data Reviewed: I have personally reviewed following labs and imaging studies.  CBC: Recent Labs  Lab 05/30/19 1137 05/31/19 0403  WBC 17.5* 13.1*  NEUTROABS  --  10.3*  HGB 13.4 12.6*  HCT 41.3 40.4  MCV 94.3 97.8  PLT 276 270   Basic Metabolic Panel: Recent Labs  Lab 05/30/19 1137 05/31/19 0403  NA 140 140  K 4.1 4.0  CL 98 105  CO2 27 22  GLUCOSE 130* 137*  BUN 56* 69*  CREATININE 3.75* 2.28*  CALCIUM 9.1 8.5*   GFR: Estimated Creatinine Clearance: 47.2 mL/min (A) (by C-G formula based on SCr of 2.28 mg/dL (H)). Liver Function Tests: Recent Labs  Lab 05/30/19 1137 05/31/19 0403  AST 26 29  ALT 35 34  ALKPHOS 72 75  BILITOT 1.7* 1.5*  PROT 7.8 7.0  ALBUMIN 3.0* 2.8*   Recent Labs  Lab 05/30/19 1137  LIPASE 32   No results for input(s): AMMONIA in the last 168 hours. Coagulation Profile: No results for input(s): INR,  PROTIME in the last 168 hours. Cardiac Enzymes: No results for input(s): CKTOTAL, CKMB, CKMBINDEX, TROPONINI in the last 168 hours. BNP (last 3 results) No results for input(s): PROBNP in the last 8760 hours. HbA1C: No results for input(s): HGBA1C in the last 72 hours. CBG: No results for input(s): GLUCAP in the last 168 hours. Lipid Profile: No results for input(s): CHOL, HDL, LDLCALC, TRIG, CHOLHDL, LDLDIRECT in the last 72 hours. Thyroid Function Tests: Recent Labs    05/31/19 0727  TSH 2.498   Anemia Panel: No results for input(s): VITAMINB12, FOLATE, FERRITIN, TIBC, IRON, RETICCTPCT in the last 72 hours. Sepsis Labs: No results for input(s): PROCALCITON, LATICACIDVEN in the last 168 hours.  Recent Results (from the past 240 hour(s))  Novel Coronavirus, NAA ([facility])     Status: None   Collection Time: 05/29/19 12:00 AM   Specimen: Nasopharyngeal(NP) swabs in vial transport medium   NASOPHARYNGE  TESTING  Result Value Ref Range Status   SARS-CoV-2, NAA Not Detected Not Detected Final    Comment: This nucleic acid amplification test was developed and its performance characteristics determined by [facility]. Nucleic acid amplification tests include PCR and TMA. This test has not been FDA cleared or approved. This test has been authorized by FDA under an Emergency Use Authorization (EUA). This test is only authorized for the duration of time the declaration that circumstances exist justifying the authorization of the emergency use of in vitro diagnostic tests for detection of SARS-CoV-2 virus and/or diagnosis of COVID-19 infection under section 564(b)(1) of the Act, 21 U.S.C. 811BJY-7(W360bbb-3(b) (1), unless the authorization is terminated or revoked sooner. When diagnostic testing is negative, the possibility of a false negative result should be considered in the context of a patient's recent exposures and the presence of clinical signs and symptoms consistent with  COVID-19. An individual without symptoms of COVID-19 and who is not shedding SARS-CoV-2 virus would  expect to have a negative (not detected) result in this assay.   SARS CORONAVIRUS 2 (TAT 6-24 HRS) Nasopharyngeal Nasopharyngeal Swab     Status: None   Collection Time: 05/30/19  3:50 PM   Specimen: Nasopharyngeal Swab  Result Value Ref Range Status   SARS Coronavirus 2 NEGATIVE NEGATIVE Final    Comment: (NOTE) SARS-CoV-2 target nucleic acids are NOT DETECTED. The SARS-CoV-2 RNA is generally detectable in upper and lower respiratory specimens during the acute phase of infection.  Negative results do not preclude SARS-CoV-2 infection, do not rule out co-infections with other pathogens, and should not be used as the sole basis for treatment or other patient management decisions. Negative results must be combined with clinical observations, patient history, and epidemiological information. The expected result is Negative. Fact Sheet for Patients: HairSlick.no Fact Sheet for Healthcare Providers: quierodirigir.com This test is not yet approved or cleared by the [place] FDA and  has been authorized for detection and/or diagnosis of SARS-CoV-2 by FDA under an Emergency Use Authorization (EUA). This EUA will remain  in effect (meaning this test can be used) for the duration of the COVID-19 declaration under Section 56 4(b)(1) of the Act, 21 U.S.C. section 360bbb-3(b)(1), unless the authorization is terminated or revoked sooner. Performed at [facility], [address] [place], [place] [address]       Radiology Studies: Ct Abdomen Pelvis Wo Contrast  Result Date: 05/30/2019 CLINICAL DATA:  Acute renal failure EXAM: CT ABDOMEN AND PELVIS WITHOUT CONTRAST TECHNIQUE: Multidetector CT imaging of the abdomen and pelvis was performed following the standard protocol without IV contrast. COMPARISON:  None. FINDINGS: Lower  chest: There is atelectasis at the lung bases.The heart size is mildly enlarged Hepatobiliary: There is decreased hepatic attenuation suggestive of hepatic steatosis. There is significant pericholecystic fat stranding and free fluid. The gallbladder is mildly distended. There is a 3.2 x 2.3 cm collection abutting the gallbladder fundus and adjacent:. This is best visualized on the coronal series. Pancreas: Normal contours without ductal dilatation. No peripancreatic fluid collection. Spleen: No splenic laceration or hematoma. Adrenals/Urinary Tract: --Adrenal glands: No adrenal hemorrhage. --Right kidney/ureter: No hydronephrosis or perinephric hematoma. --Left kidney/ureter: No hydronephrosis or perinephric hematoma. --Urinary bladder: Unremarkable. Stomach/Bowel: --Stomach/Duodenum: No hiatal hernia or other gastric abnormality. Normal duodenal course and caliber. --Small bowel: No dilatation or inflammation. --Colon: No focal abnormality. --Appendix: Normal. Vascular/Lymphatic: Atherosclerotic calcification is present within the non-aneurysmal abdominal aorta, without hemodynamically significant stenosis. --No retroperitoneal lymphadenopathy. --No mesenteric lymphadenopathy. --No pelvic or inguinal lymphadenopathy. Reproductive: Unremarkable Other: No ascites or free air. There are bilateral fat containing inguinal hernias. Musculoskeletal. No acute displaced fractures. IMPRESSION: 1. Pericholecystic fat stranding and free fluid, consistent with acute cholecystitis in the appropriate clinical setting. There is a 3.2 x 2.3 cm collection abutting the gallbladder fundus and the colon, raising concern for perforation. This is suboptimally evaluated in the absence of IV contrast. 2. Hepatic steatosis. 3. Bilateral fat containing inguinal hernias. 4. No hydronephrosis.  No radiopaque obstructing kidney stones. Aortic Atherosclerosis (ICD10-I70.0). Electronically Signed   By: Katherine Mantle M.D.   On: 05/30/2019  20:13   Dg Chest Port 1 View  Result Date: 05/30/2019 CLINICAL DATA:  Shortness of breath. EXAM: PORTABLE CHEST 1 VIEW COMPARISON:  Oct 24, 2016 FINDINGS: Minimal atelectasis in the left base. No pneumothorax. The heart, hila, mediastinum, lungs, and pleura are normal. IMPRESSION: No acute abnormalities. Electronically Signed   By: Gerome Sam III M.D   On: 05/30/2019 14:41   US Abdomen Limited Ruq  Result Date: 05/30/2019 CLINICAL DATA:  Pain. Additional history provided by scanning technologist: Right upper quadrant pain for 2 days. EXAM: ULTRASOUND ABDOMEN LIMITED RIGHT UPPER QUADRANT COMPARISON:  CT abdomen/pelvis performed earlier the same day 05/30/2019 FINDINGS: Gallbladder: Cholecystolithiasis. Gallbladder wall thickening to 5 mm with pericholecystic fluid. Additionally, a sonographic Eulah Pont sign is elicited by the scanning technologist. Common bile duct: Diameter: The visualized common duct is dilated to 8 mm in diameter. Liver: No focal lesion  identified. Increased hepatic parenchymal echogenicity. Interrogated portal vein is patent on color Doppler imaging with normal direction of blood flow towards the liver. IMPRESSION: Cholecystolithiasis with gallbladder wall thickening and pericholecystic fluid. Additionally, a sonographic Eulah Pont sign is elicited by the scanning technologist. Findings are consistent with acute cholecystitis in the appropriate clinical setting. Extrahepatic biliary ductal dilatation with the visualized common duct measuring 8 mm in diameter. Increased hepatic parenchymal echogenicity. This finding is nonspecific, but most commonly seen in the setting of hepatic steatosis. Electronically Signed   By: Jackey Loge DO   On: 05/30/2019 22:29     Scheduled Meds: Continuous Infusions: . sodium chloride 125 mL/hr at 05/31/19 0149  . diltiazem (CARDIZEM) infusion 15 mg/hr (05/31/19 0149)  . piperacillin-tazobactam (ZOSYN)  IV Stopped (05/31/19 0737)     LOS: 1 day     Time spent: 25 minutes spent in the coordination of care today.    Teddy Spike, DO [facility] Pager [phone]  If 7PM-7AM, please contact night-coverage [email or web address] Password [email or web address] 05/31/2019, 10:38 AM

## 2019-05-31 NOTE — Consult Note (Addendum)
Cardiology Consultation:   Patient IDGloris [name]: Martin [name] MRN: [id number]; DOB: 04/01/1961  Admit date: 05/30/2019 Date of Consult: 05/31/2019  Primary Care Provider: [name], [name], MD Primary Cardiologist: Dr. Antoine PocheHochrein    Patient Profile:   Martin ManchesterLonnelle [name] is a 58 y.o. male with a hx of HTN, LVH, hypothyroidism and HLD who is being seen today for the evaluation of afib RVR and pre-op clearance at the request of Dr. Eilene GhaziKakarandy.   Established care with Dr. Antoine PocheHochrein in 2017 for HTN. Recommended to follow up as PRN.   History of Present Illness:   Martin [name] came to ER yesterday with few days hx of abdominal pain, nausea and vomiting. COVID negative. abdomen and pelvis shows features concerning for cholecystitis with possible perforation. Abdominal US showed cholecystitis. Seen by Dr. Arnetha Gulaemirez >> likely lep chole tomorrow. On abx empirically.   Also noted to have AKI and new onset afib RVR. On cardizem gtt with improved rate. Surgery recommended to avoid heparin. Scr improved to 3.75>>>2.28. TSH 2.4. pending echo.   He did not felt any palpitations. Non smoker and drinker. He denies chest pain, sob, Palpitations, orthopnea, PND, Syncope, LE edema or dizziness. 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 He was working out 2 days/week up until 2 months ago. No family hx of CAD.   Heart Pathway Score:     Past Medical History:  Diagnosis Date   Cardiomegaly    LVH   Cataract    Hemorrhoids    Hyperlipidemia    Hypertension    Hypothyroidism    Lower GI bleeding     Past Surgical History:  Procedure Laterality Date   DOPPLER ECHOCARDIOGRAPHY      Inpatient Medications: Scheduled Meds:  Continuous Infusions:  sodium chloride 125 mL/hr at 05/31/19 0149   diltiazem (CARDIZEM) infusion 15 mg/hr (05/31/19 0149)   piperacillin-tazobactam (ZOSYN)  IV Stopped (05/31/19 0737)   PRN Meds: acetaminophen **OR** acetaminophen, morphine  injection, ondansetron **OR** ondansetron (ZOFRAN) IV  Allergies:    Allergies  Allergen Reactions   Garlic Swelling   Shrimp [Shellfish Allergy] Swelling    Social History:   Social History   Socioeconomic History   Marital status: Single    Spouse name: Not on file   Number of children: 1   Years of education: Not on file   Highest education level: Not on file  Occupational History   Occupation: Warden/rangerfloor tech    Employer: [employer]  Social Needs   Financial resource strain: Not on file   Food insecurity    Worry: Not on file    Inability: Not on file   Transportation needs    Medical: Not on file    Non-medical: Not on file  Tobacco Use   Smoking status: Never Smoker   Smokeless tobacco: Never Used  Substance and Sexual Activity   Alcohol use: Yes    Comment: Rare   Drug use: No   Sexual activity: Yes  Lifestyle   Physical activity    Days per week: Not on file    Minutes per session: Not on file   Stress: Not on file  Relationships   Social connections    Talks on phone: Not on file    Gets together: Not on file    Attends religious service: Not on file    Active member of club or organization: Not on file    Attends meetings of clubs or organizations: Not on file  Relationship status: Not on file   Intimate partner violence    Fear of current or ex partner: Not on file    Emotionally abused: Not on file    Physically abused: Not on file    Forced sexual activity: Not on file  Other Topics Concern   Not on file  Social History Narrative   Not on file    Family History:   Family History  Problem Relation Age of Onset   Kidney disease Mother    Hypertension Father    Kidney disease Father    Asthma Father      ROS:  Please see the history of present illness.  All other ROS reviewed and negative.     Physical Exam/Data:   Vitals:   05/31/19 0352 05/31/19 0430 05/31/19 0645 05/31/19 0715  BP:  (!) 121/91 130/83 (!) 120/94    Pulse:  (!) 111 100 96  Resp: 20 (!) 25 20 18   Temp:      TempSrc:      SpO2:  96% 92% 96%  Weight:      Height:        Intake/Output Summary (Last 24 hours) at 05/31/2019 0907 Last data filed at 05/31/2019 0744 Gross per 24 hour  Intake 1151.27 ml  Output 300 ml  Net 851.27 ml   Last 3 Weights 05/30/2019 10/06/2017 10/29/2016  Weight (lbs) 270 lb 256 lb 6.4 oz 257 lb  Weight (kg) 122.471 kg 116.302 kg 116.574 kg     Body mass index is 35.62 kg/m.  General:  Well nourished, well developed, in no acute distress HEENT: normal Lymph: no adenopathy Neck: no JVD Endocrine:  No thryomegaly Vascular: No carotid bruits; FA pulses 2+ bilaterally without bruits  Cardiac:  normal S1, S2; Ir Ir tachycardic, no murmur  Lungs:  clear to auscultation bilaterally, no wheezing, rhonchi or rales  Abd: soft, nontender, no hepatomegaly  Ext: no edema Musculoskeletal:  No deformities, BUE and BLE strength normal and equal Skin: warm and dry  Neuro:  CNs 2-12 intact, no focal abnormalities noted Psych:  Normal affect   EKG:  The EKG was personally reviewed and demonstrates:  afib RVR at rate of 141 bpm and RBBB Telemetry:  Telemetry was personally reviewed and demonstrates:  Afib at 90-110s  Relevant CV Studies:  As above  Laboratory Data:  High Sensitivity Troponin:   Recent Labs  Lab 05/31/19 0727  TROPONINIHS 8     Chemistry Recent Labs  Lab 05/30/19 1137 05/31/19 0403  NA 140 140  K 4.1 4.0  CL 98 105  CO2 27 22  GLUCOSE 130* 137*  BUN 56* 69*  CREATININE 3.75* 2.28*  CALCIUM 9.1 8.5*  GFRNONAA 16* 30*  GFRAA 19* 35*  ANIONGAP 15 13    Recent Labs  Lab 05/30/19 1137 05/31/19 0403  PROT 7.8 7.0  ALBUMIN 3.0* 2.8*  AST 26 29  ALT 35 34  ALKPHOS 72 75  BILITOT 1.7* 1.5*   Hematology Recent Labs  Lab 05/30/19 1137 05/31/19 0403  WBC 17.5* 13.1*  RBC 4.38 4.13*  HGB 13.4 12.6*  HCT 41.3 40.4  MCV 94.3 97.8  MCH 30.6 30.5  MCHC 32.4 31.2  RDW 14.5 15.2   PLT 276 270   BNP Recent Labs  Lab 05/30/19 1600  BNP 75.9    DDimer No results for input(s): DDIMER in the last 168 hours.   Radiology/Studies:  Ct Abdomen Pelvis Wo Contrast  Result Date: 05/30/2019 CLINICAL DATA:  Acute renal failure EXAM: CT ABDOMEN AND PELVIS WITHOUT CONTRAST TECHNIQUE: Multidetector CT imaging of the abdomen and pelvis was performed following the standard protocol without IV contrast. COMPARISON:  None. FINDINGS: Lower chest: There is atelectasis at the lung bases.The heart size is mildly enlarged Hepatobiliary: There is decreased hepatic attenuation suggestive of hepatic steatosis. There is significant pericholecystic fat stranding and free fluid. The gallbladder is mildly distended. There is a 3.2 x 2.3 cm collection abutting the gallbladder fundus and adjacent:. This is best visualized on the coronal series. Pancreas: Normal contours without ductal dilatation. No peripancreatic fluid collection. Spleen: No splenic laceration or hematoma. Adrenals/Urinary Tract: --Adrenal glands: No adrenal hemorrhage. --Right kidney/ureter: No hydronephrosis or perinephric hematoma. --Left kidney/ureter: No hydronephrosis or perinephric hematoma. --Urinary bladder: Unremarkable. Stomach/Bowel: --Stomach/Duodenum: No hiatal hernia or other gastric abnormality. Normal duodenal course and caliber. --Small bowel: No dilatation or inflammation. --Colon: No focal abnormality. --Appendix: Normal. Vascular/Lymphatic: Atherosclerotic calcification is present within the non-aneurysmal abdominal aorta, without hemodynamically significant stenosis. --No retroperitoneal lymphadenopathy. --No mesenteric lymphadenopathy. --No pelvic or inguinal lymphadenopathy. Reproductive: Unremarkable Other: No ascites or free air. There are bilateral fat containing inguinal hernias. Musculoskeletal. No acute displaced fractures. IMPRESSION: 1. Pericholecystic fat stranding and free fluid, consistent with acute  cholecystitis in the appropriate clinical setting. There is a 3.2 x 2.3 cm collection abutting the gallbladder fundus and the colon, raising concern for perforation. This is suboptimally evaluated in the absence of IV contrast. 2. Hepatic steatosis. 3. Bilateral fat containing inguinal hernias. 4. No hydronephrosis.  No radiopaque obstructing kidney stones. Aortic Atherosclerosis (ICD10-I70.0). Electronically Signed   By: Katherine Mantle M.D.   On: 05/30/2019 20:13   Dg Chest Port 1 View  Result Date: 05/30/2019 CLINICAL DATA:  Shortness of breath. EXAM: PORTABLE CHEST 1 VIEW COMPARISON:  Oct 24, 2016 FINDINGS: Minimal atelectasis in the left base. No pneumothorax. The heart, hila, mediastinum, lungs, and pleura are normal. IMPRESSION: No acute abnormalities. Electronically Signed   By: Gerome Sam III M.D   On: 05/30/2019 14:41   US Abdomen Limited Ruq  Result Date: 05/30/2019 CLINICAL DATA:  Pain. Additional history provided by scanning technologist: Right upper quadrant pain for 2 days. EXAM: ULTRASOUND ABDOMEN LIMITED RIGHT UPPER QUADRANT COMPARISON:  CT abdomen/pelvis performed earlier the same day 05/30/2019 FINDINGS: Gallbladder: Cholecystolithiasis. Gallbladder wall thickening to 5 mm with pericholecystic fluid. Additionally, a sonographic Eulah Pont sign is elicited by the scanning technologist. Common bile duct: Diameter: The visualized common duct is dilated to 8 mm in diameter. Liver: No focal lesion identified. Increased hepatic parenchymal echogenicity. Interrogated portal vein is patent on color Doppler imaging with normal direction of blood flow towards the liver. IMPRESSION: Cholecystolithiasis with gallbladder wall thickening and pericholecystic fluid. Additionally, a sonographic Eulah Pont sign is elicited by the scanning technologist. Findings are consistent with acute cholecystitis in the appropriate clinical setting. Extrahepatic biliary ductal dilatation with the visualized common duct  measuring 8 mm in diameter. Increased hepatic parenchymal echogenicity. This finding is nonspecific, but most commonly seen in the setting of hepatic steatosis. Electronically Signed   By: Jackey Loge DO   On: 05/30/2019 22:29    Assessment and Plan:   1. Atrial fibrillation at RVR - On IV cardizem for rate control. Likely driven by acute illness. CHADSVASC score of 1 for HTN. Dr. Shelle Iron recommended to avoid heparin as plan for possible surgery tomorrow. TSH normal. Pending echo   2. HTN - BP stable  3. Pre op clearance - He is very active  at baseline. Easily getting > 4 mets of activity. Asymptomatic with afib RVR. Top negative. RCRI is 0.9%. Pending echo. He should be low risk for surgery assuming normal echo.   4. New RBBB - no ischemic evaluation currently planned - Hs- troponin normal   For questions or updates, please contact [facility] Please consult [email or web address] for contact info under     Lorelei Pont, PA  05/31/2019 9:07 AM

## 2019-05-31 NOTE — Progress Notes (Signed)
*  PRELIMINARY RESULTS* Echocardiogram 2D Echocardiogram has been performed.  Leavy Cella 05/31/2019, 10:17 AM

## 2019-06-01 ENCOUNTER — Inpatient Hospital Stay (HOSPITAL_COMMUNITY): Payer: BC Managed Care – PPO

## 2019-06-01 ENCOUNTER — Encounter (HOSPITAL_COMMUNITY)
Admission: EM | Disposition: A | Discharge status: Home / Self Care | Payer: Self-pay | Source: Home / Self Care | Attending: Family Medicine

## 2019-06-01 ENCOUNTER — Encounter (HOSPITAL_COMMUNITY): Payer: Self-pay | Admitting: Internal Medicine

## 2019-06-01 DIAGNOSIS — R1010 Upper abdominal pain, unspecified: Secondary | ICD-10-CM

## 2019-06-01 DIAGNOSIS — E039 Hypothyroidism, unspecified: Secondary | ICD-10-CM

## 2019-06-01 HISTORY — PX: CHOLECYSTECTOMY: SHX55

## 2019-06-01 LAB — COMPREHENSIVE METABOLIC PANEL
ALT: 47 U/L — ABNORMAL HIGH (ref 0–44)
AST: 39 U/L (ref 15–41)
Albumin: 2.5 g/dL — ABNORMAL LOW (ref 3.5–5.0)
Alkaline Phosphatase: 89 U/L (ref 38–126)
Anion gap: 12 (ref 5–15)
BUN: 52 mg/dL — ABNORMAL HIGH (ref 6–20)
CO2: 25 mmol/L (ref 22–32)
Calcium: 8.6 mg/dL — ABNORMAL LOW (ref 8.9–10.3)
Chloride: 106 mmol/L (ref 98–111)
Creatinine, Ser: 1.72 mg/dL — ABNORMAL HIGH (ref 0.61–1.24)
GFR calc Af Amer: 49 mL/min — ABNORMAL LOW (ref >60)
GFR calc non Af Amer: 42 mL/min — ABNORMAL LOW (ref >60)
Glucose, Bld: 133 mg/dL — ABNORMAL HIGH (ref 70–99)
Potassium: 3.9 mmol/L (ref 3.5–5.1)
Sodium: 143 mmol/L (ref 135–145)
Total Bilirubin: 1.5 mg/dL — ABNORMAL HIGH (ref 0.3–1.2)
Total Protein: 7.2 g/dL (ref 6.5–8.1)

## 2019-06-01 LAB — CBC WITH DIFFERENTIAL/PLATELET
Abs Immature Granulocytes: 0.02 10*3/uL (ref 0.00–0.07)
Basophils Absolute: 0.1 10*3/uL (ref 0.0–0.1)
Basophils Relative: 1 %
Eosinophils Absolute: 0.1 10*3/uL (ref 0.0–0.5)
Eosinophils Relative: 1 %
HCT: 36.7 % — ABNORMAL LOW (ref 39.0–52.0)
Hemoglobin: 12 g/dL — ABNORMAL LOW (ref 13.0–17.0)
Immature Granulocytes: 0 %
Lymphocytes Relative: 16 %
Lymphs Abs: 1.7 10*3/uL (ref 0.7–4.0)
MCH: 30.4 pg (ref 26.0–34.0)
MCHC: 32.7 g/dL (ref 30.0–36.0)
MCV: 92.9 fL (ref 80.0–100.0)
Monocytes Absolute: 1 10*3/uL (ref 0.1–1.0)
Monocytes Relative: 9 %
Neutro Abs: 7.8 10*3/uL — ABNORMAL HIGH (ref 1.7–7.7)
Neutrophils Relative %: 73 %
Platelets: 298 10*3/uL (ref 150–400)
RBC: 3.95 MIL/uL — ABNORMAL LOW (ref 4.22–5.81)
RDW: 14.7 % (ref 11.5–15.5)
WBC: 10.7 10*3/uL — ABNORMAL HIGH (ref 4.0–10.5)
nRBC: 0 % (ref 0.0–0.2)

## 2019-06-01 LAB — SURGICAL PCR SCREEN
MRSA, PCR: NEGATIVE
Staphylococcus aureus: NEGATIVE

## 2019-06-01 LAB — MAGNESIUM: Magnesium: 2.8 mg/dL — ABNORMAL HIGH (ref 1.7–2.4)

## 2019-06-01 LAB — GLUCOSE, CAPILLARY: Glucose-Capillary: 120 mg/dL — ABNORMAL HIGH (ref 70–99)

## 2019-06-01 LAB — LIPASE, BLOOD: Lipase: 44 U/L (ref 11–51)

## 2019-06-01 SURGERY — LAPAROSCOPIC CHOLECYSTECTOMY WITH INTRAOPERATIVE CHOLANGIOGRAM
Anesthesia: General | Site: Abdomen

## 2019-06-01 MED ORDER — FENTANYL CITRATE (PF) 250 MCG/5ML IJ SOLN
INTRAMUSCULAR | Status: AC
  Filled 2019-06-01: qty 5

## 2019-06-01 MED ORDER — SUGAMMADEX SODIUM 500 MG/5ML IV SOLN
INTRAVENOUS | Status: AC
  Filled 2019-06-01: qty 5

## 2019-06-01 MED ORDER — EPHEDRINE 5 MG/ML INJ
INTRAVENOUS | Status: AC
  Filled 2019-06-01: qty 10

## 2019-06-01 MED ORDER — MIDAZOLAM HCL 5 MG/5ML IJ SOLN
INTRAMUSCULAR | Status: DC | PRN
  Administered 2019-06-01: 2 mg via INTRAVENOUS

## 2019-06-01 MED ORDER — HYDROMORPHONE HCL 1 MG/ML IJ SOLN
INTRAMUSCULAR | Status: AC
  Filled 2019-06-01: qty 1

## 2019-06-01 MED ORDER — ONDANSETRON HCL 4 MG/2ML IJ SOLN
INTRAMUSCULAR | Status: AC
  Filled 2019-06-01: qty 2

## 2019-06-01 MED ORDER — ONDANSETRON HCL 4 MG/2ML IJ SOLN
INTRAMUSCULAR | Status: DC | PRN
  Administered 2019-06-01: 4 mg via INTRAVENOUS

## 2019-06-01 MED ORDER — PHENYLEPHRINE HCL-NACL 10-0.9 MG/250ML-% IV SOLN
INTRAVENOUS | Status: DC | PRN
  Administered 2019-06-01: 20 ug/min via INTRAVENOUS

## 2019-06-01 MED ORDER — HYDROMORPHONE HCL 1 MG/ML IJ SOLN
0.2500 mg | INTRAMUSCULAR | Status: DC | PRN
  Administered 2019-06-01 (×2): 0.5 mg via INTRAVENOUS

## 2019-06-01 MED ORDER — ACETAMINOPHEN 500 MG PO TABS
1000.0000 mg | ORAL_TABLET | Freq: Three times a day (TID) | ORAL | Status: DC
  Administered 2019-06-01 – 2019-06-07 (×16): 1000 mg via ORAL
  Filled 2019-06-01 (×18): qty 2

## 2019-06-01 MED ORDER — FENTANYL CITRATE (PF) 250 MCG/5ML IJ SOLN
INTRAMUSCULAR | Status: DC | PRN
  Administered 2019-06-01: 150 ug via INTRAVENOUS

## 2019-06-01 MED ORDER — SODIUM CHLORIDE 0.9 % IV SOLN: INTRAVENOUS | Status: DC | PRN

## 2019-06-01 MED ORDER — LACTATED RINGERS IV SOLN
INTRAVENOUS | Status: DC | PRN
  Administered 2019-06-01: 07:00:00 via INTRAVENOUS

## 2019-06-01 MED ORDER — ROCURONIUM BROMIDE 10 MG/ML (PF) SYRINGE
PREFILLED_SYRINGE | INTRAVENOUS | Status: AC
  Filled 2019-06-01: qty 10

## 2019-06-01 MED ORDER — ONDANSETRON HCL 4 MG/2ML IJ SOLN: 4.0000 mg | Freq: Once | INTRAMUSCULAR | Status: DC | PRN

## 2019-06-01 MED ORDER — EPHEDRINE SULFATE-NACL 50-0.9 MG/10ML-% IV SOSY
PREFILLED_SYRINGE | INTRAVENOUS | Status: DC | PRN
  Administered 2019-06-01: 5 mg via INTRAVENOUS

## 2019-06-01 MED ORDER — PROPOFOL 10 MG/ML IV BOLUS
INTRAVENOUS | Status: DC | PRN
  Administered 2019-06-01: 130 mg via INTRAVENOUS

## 2019-06-01 MED ORDER — 0.9 % SODIUM CHLORIDE (POUR BTL) OPTIME
TOPICAL | Status: DC | PRN
  Administered 2019-06-01: 1000 mL

## 2019-06-01 MED ORDER — MORPHINE SULFATE (PF) 2 MG/ML IV SOLN: 1.0000 mg | INTRAVENOUS | Status: DC | PRN

## 2019-06-01 MED ORDER — CHLORHEXIDINE GLUCONATE CLOTH 2 % EX PADS
6.0000 | MEDICATED_PAD | Freq: Once | CUTANEOUS | Status: AC
  Administered 2019-06-01: 6 via TOPICAL

## 2019-06-01 MED ORDER — PHENYLEPHRINE 40 MCG/ML (10ML) SYRINGE FOR IV PUSH (FOR BLOOD PRESSURE SUPPORT)
PREFILLED_SYRINGE | INTRAVENOUS | Status: AC
  Filled 2019-06-01: qty 10

## 2019-06-01 MED ORDER — ALBUMIN HUMAN 5 % IV SOLN
INTRAVENOUS | Status: DC | PRN
  Administered 2019-06-01: 08:00:00 via INTRAVENOUS

## 2019-06-01 MED ORDER — SODIUM CHLORIDE 0.9 % IV SOLN
INTRAVENOUS | Status: DC
  Administered 2019-06-01 – 2019-06-05 (×8): via INTRAVENOUS

## 2019-06-01 MED ORDER — PROPOFOL 10 MG/ML IV BOLUS
INTRAVENOUS | Status: AC
  Filled 2019-06-01: qty 40

## 2019-06-01 MED ORDER — LIDOCAINE 2% (20 MG/ML) 5 ML SYRINGE
INTRAMUSCULAR | Status: DC | PRN
  Administered 2019-06-01: 100 mg via INTRAVENOUS

## 2019-06-01 MED ORDER — MEPERIDINE HCL 25 MG/ML IJ SOLN: 6.2500 mg | INTRAMUSCULAR | Status: DC | PRN

## 2019-06-01 MED ORDER — MIDAZOLAM HCL 2 MG/2ML IJ SOLN
INTRAMUSCULAR | Status: AC
  Filled 2019-06-01: qty 2

## 2019-06-01 MED ORDER — LIDOCAINE 2% (20 MG/ML) 5 ML SYRINGE
INTRAMUSCULAR | Status: AC
  Filled 2019-06-01: qty 5

## 2019-06-01 MED ORDER — OXYCODONE HCL 5 MG PO TABS
5.0000 mg | ORAL_TABLET | ORAL | Status: DC | PRN
  Administered 2019-06-01 – 2019-06-02 (×2): 10 mg via ORAL
  Filled 2019-06-01 (×3): qty 2

## 2019-06-01 MED ORDER — SODIUM CHLORIDE 0.9 % IR SOLN
Status: DC | PRN
  Administered 2019-06-01: 1000 mL

## 2019-06-01 MED ORDER — HEMOSTATIC AGENTS (NO CHARGE) OPTIME
TOPICAL | Status: DC | PRN
  Administered 2019-06-01 (×2): 1 via TOPICAL

## 2019-06-01 MED ORDER — DEXAMETHASONE SODIUM PHOSPHATE 10 MG/ML IJ SOLN
INTRAMUSCULAR | Status: AC
  Filled 2019-06-01: qty 1

## 2019-06-01 MED ORDER — BUPIVACAINE HCL 0.25 % IJ SOLN
INTRAMUSCULAR | Status: DC | PRN
  Administered 2019-06-01: 13 mL

## 2019-06-01 MED ORDER — SUGAMMADEX SODIUM 200 MG/2ML IV SOLN
INTRAVENOUS | Status: DC | PRN
  Administered 2019-06-01: 250 mg via INTRAVENOUS

## 2019-06-01 MED ORDER — PHENYLEPHRINE 40 MCG/ML (10ML) SYRINGE FOR IV PUSH (FOR BLOOD PRESSURE SUPPORT)
PREFILLED_SYRINGE | INTRAVENOUS | Status: DC | PRN
  Administered 2019-06-01: 80 ug via INTRAVENOUS
  Administered 2019-06-01 (×2): 120 ug via INTRAVENOUS
  Administered 2019-06-01: 80 ug via INTRAVENOUS

## 2019-06-01 MED ORDER — BUPIVACAINE HCL (PF) 0.25 % IJ SOLN
INTRAMUSCULAR | Status: AC
  Filled 2019-06-01: qty 30

## 2019-06-01 MED ORDER — ROCURONIUM BROMIDE 10 MG/ML (PF) SYRINGE
PREFILLED_SYRINGE | INTRAVENOUS | Status: DC | PRN
  Administered 2019-06-01: 20 mg via INTRAVENOUS
  Administered 2019-06-01: 100 mg via INTRAVENOUS

## 2019-06-01 SURGICAL SUPPLY — 53 items
APL PRP STRL LF DISP 70% ISPRP (MISCELLANEOUS) ×1
APL SKNCLS STERI-STRIP NONHPOA (GAUZE/BANDAGES/DRESSINGS) ×1
APPLIER CLIP ROT 10 11.4 M/L (STAPLE) ×2
APR CLP MED LRG 11.4X10 (STAPLE) ×1
BAG SPEC RTRVL 10 TROC 200 (ENDOMECHANICALS) ×1
BAG SPEC RTRVL LRG 6X4 10 (ENDOMECHANICALS)
BENZOIN TINCTURE PRP APPL 2/3 (GAUZE/BANDAGES/DRESSINGS) ×2 IMPLANT
BLADE CLIPPER SURG (BLADE) ×1 IMPLANT
CANISTER SUCT 3000ML PPV (MISCELLANEOUS) ×2 IMPLANT
CHLORAPREP W/TINT 26 (MISCELLANEOUS) ×2 IMPLANT
CLIP APPLIE ROT 10 11.4 M/L (STAPLE) ×1 IMPLANT
CLSR STERI-STRIP ANTIMIC 1/2X4 (GAUZE/BANDAGES/DRESSINGS) ×1 IMPLANT
COVER MAYO STAND STRL (DRAPES) ×2 IMPLANT
COVER SURGICAL LIGHT HANDLE (MISCELLANEOUS) ×2 IMPLANT
DRAIN CHANNEL 19F RND (DRAIN) ×1 IMPLANT
DRAPE C-ARM 42X120 X-RAY (DRAPES) ×2 IMPLANT
DRSG TEGADERM 2-3/8X2-3/4 SM (GAUZE/BANDAGES/DRESSINGS) ×6 IMPLANT
DRSG TEGADERM 4X4.75 (GAUZE/BANDAGES/DRESSINGS) ×2 IMPLANT
ELECT REM PT RETURN 9FT ADLT (ELECTROSURGICAL) ×2
ELECTRODE REM PT RTRN 9FT ADLT (ELECTROSURGICAL) ×1 IMPLANT
EVACUATOR SILICONE 100CC (DRAIN) ×1 IMPLANT
GAUZE SPONGE 2X2 8PLY STRL LF (GAUZE/BANDAGES/DRESSINGS) ×1 IMPLANT
GLOVE BIO SURGEON STRL SZ7 (GLOVE) ×2 IMPLANT
GLOVE BIOGEL PI IND STRL 7.5 (GLOVE) ×1 IMPLANT
GLOVE BIOGEL PI INDICATOR 7.5 (GLOVE) ×1
GOWN STRL REUS W/ TWL LRG LVL3 (GOWN DISPOSABLE) ×3 IMPLANT
GOWN STRL REUS W/TWL LRG LVL3 (GOWN DISPOSABLE) ×6
HEMOSTAT SNOW SURGICEL 2X4 (HEMOSTASIS) ×2 IMPLANT
KIT BASIN OR (CUSTOM PROCEDURE TRAY) ×2 IMPLANT
KIT TURNOVER KIT B (KITS) ×2 IMPLANT
NS IRRIG 1000ML POUR BTL (IV SOLUTION) ×2 IMPLANT
PAD ARMBOARD 7.5X6 YLW CONV (MISCELLANEOUS) ×2 IMPLANT
POUCH RETRIEVAL ECOSAC 10 (ENDOMECHANICALS) IMPLANT
POUCH RETRIEVAL ECOSAC 10MM (ENDOMECHANICALS) ×1
POUCH SPECIMEN RETRIEVAL 10MM (ENDOMECHANICALS) IMPLANT
SCISSORS LAP 5X35 DISP (ENDOMECHANICALS) ×2 IMPLANT
SET CHOLANGIOGRAPH 5 50 .035 (SET/KITS/TRAYS/PACK) ×2 IMPLANT
SET IRRIG TUBING LAPAROSCOPIC (IRRIGATION / IRRIGATOR) ×2 IMPLANT
SET TUBE SMOKE EVAC HIGH FLOW (TUBING) ×2 IMPLANT
SLEEVE ENDOPATH XCEL 5M (ENDOMECHANICALS) ×2 IMPLANT
SPECIMEN JAR SMALL (MISCELLANEOUS) ×2 IMPLANT
SPONGE GAUZE 2X2 8PLY STRL LF (GAUZE/BANDAGES/DRESSINGS) ×1 IMPLANT
SPONGE GAUZE 2X2 STER 10/PKG (GAUZE/BANDAGES/DRESSINGS) ×1
STRIP CLOSURE SKIN 1/2X4 (GAUZE/BANDAGES/DRESSINGS) ×2 IMPLANT
SUT ETHILON 2 0 FS 18 (SUTURE) ×1 IMPLANT
SUT MNCRL AB 4-0 PS2 18 (SUTURE) ×2 IMPLANT
TOWEL GREEN STERILE (TOWEL DISPOSABLE) ×2 IMPLANT
TOWEL GREEN STERILE FF (TOWEL DISPOSABLE) ×2 IMPLANT
TRAY LAPAROSCOPIC MC (CUSTOM PROCEDURE TRAY) ×2 IMPLANT
TROCAR XCEL BLUNT TIP 100MML (ENDOMECHANICALS) ×2 IMPLANT
TROCAR XCEL NON-BLD 11X100MML (ENDOMECHANICALS) ×2 IMPLANT
TROCAR XCEL NON-BLD 5MMX100MML (ENDOMECHANICALS) ×2 IMPLANT
WATER STERILE IRR 1000ML POUR (IV SOLUTION) ×2 IMPLANT

## 2019-06-01 NOTE — Transfer of Care (Signed)
Immediate Anesthesia Transfer of Care Note  Patient: [facility]  Procedure(s) Performed: LAPAROSCOPIC CHOLECYSTECTOMY (N/A Abdomen)  Patient Location: PACU  Anesthesia Type:General  Level of Consciousness: awake, alert , oriented and patient cooperative  Airway & Oxygen Therapy: Patient Spontanous Breathing and Patient connected to face mask oxygen  Post-op Assessment: Report given to RN, Post -op Vital signs reviewed and stable and Patient moving all extremities X 4  Post vital signs: Reviewed and stable  Last Vitals:  Vitals Value Taken Time  BP 110/95 06/01/19 0950  Temp 36.8 C 06/01/19 0950  Pulse 95 06/01/19 0950  Resp 26 06/01/19 0952  SpO2 98 % 06/01/19 0950  Vitals shown include unvalidated device data.  Last Pain:  Vitals:   06/01/19 0950  TempSrc:   PainSc: Asleep         Complications: No apparent anesthesia complications

## 2019-06-01 NOTE — Op Note (Signed)
Laparoscopic Partial Cholecystectomy Procedure Note  Indications: Patient is a 58 year old male with a history of LVH, hyperlipidemia, hypertension, hypothyroidism, who comes in with a 4-day history of right-sided abdominal pain.  Patient states that the pain was fairly significant.  He states this with associated nausea vomiting today prior to presenting to the ER.  He states that secondary to pain he presented to the ER.  Patient states he is not had previous abdominal pain like this in the past.  Upon evaluation in the ER he underwent CT scan and ultrasound.  Both these were significant for cholecystitis.  Patient had positive Murphy sign on ultrasound.  There is questionable perforation of the gallbladder near the fundus secondary to fluid collection.  Patient did not have IV contrast so difficult to assess.  I did review the CT scan and ultrasound personally.  Patient did have a T bili 1.7.  Other transaminases were within normal limits.  Pre-operative Diagnosis: Calculus of gallbladder with acute cholecystitis, without mention of obstruction  Post-operative Diagnosis: Same  Surgeon: Maia Petties   Assistants: Nedra Hai, MD  Anesthesia: General endotracheal anesthesia  ASA Class: 2  Procedure Details  The patient was seen again in the Holding Room. The risks, benefits, complications, treatment options, and expected outcomes were discussed with the patient. The possibilities of reaction to medication, pulmonary aspiration, perforation of viscus, bleeding, recurrent infection, finding a normal gallbladder, the need for additional procedures, failure to diagnose a condition, the possible need to convert to an open procedure, and creating a complication requiring transfusion or operation were discussed with the patient. The likelihood of improving the patient's symptoms with return to their baseline status is good.  The patient and/or family concurred with the proposed plan, giving  informed consent. The site of surgery properly noted. The patient was taken to Operating Room, identified as [facility] and the procedure verified as Laparoscopic Cholecystectomy with Intraoperative Cholangiogram. A Time Out was held and the above information confirmed.  Prior to the induction of general anesthesia, antibiotic prophylaxis was administered. General endotracheal anesthesia was then administered and tolerated well. After the induction, the abdomen was prepped with Chloraprep and draped in sterile fashion. The patient was positioned in the supine position.  Local anesthetic agent was injected into the skin near the umbilicus and an incision made. We dissected down to the abdominal fascia with blunt dissection.  He has a small umbilical hernia so we entered the hernia defect.  We entered the peritoneal cavity bluntly.  A pursestring suture of 0-Vicryl was placed around the fascial opening.  The Hasson cannula was inserted and secured with the stay suture.  Pneumoperitoneum was then created with CO2 and tolerated well without any adverse changes in the patient's vital signs. An 11-mm port was placed in the subxiphoid position.  Two 5-mm ports were placed in the right upper quadrant. All skin incisions were infiltrated with a local anesthetic agent before making the incision and placing the trocars.   We positioned the patient in reverse Trendelenburg, tilted slightly to the patient's left.  The omentum is densely adherent to the edge of the liver and the gallbladder is not visible.  There is obvious inflammation and some purulent fluid in this area.  We bluntly begin peeling the omentum away and the gallbladder was visualized.  It was very thickened with some necrotic areas.  It was very difficult to grasp the gallbladder.  I used cautery to create a small hole in the gallbladder and we  drain the bile to decompress it.  We were able to grab the wall of the gallbladder and lifted.  The  gallbladder wall is very thickened almost to a centimeter.  Bluntly dissected the omentum away from the remainder of the gallbladder.  The gallbladder is quite enlarged and we were not able to clearly identify the cystic duct.  I felt that the safest thing to do would be to perform a dome down technique and a partial cholecystectomy.  I began dissecting the gallbladder away from the liver at its dome.  We entered the gallbladder cavity and it appeared that the gallbladder had perforated into the liver and there was an abscess in this area.  We drained the abscess and remove the anterior wall of the gallbladder.  We continued dissecting down towards the hilum of the gallbladder.  We removed several large gallstones.  We reached an area that appeared that the gallbladder was beginning to taper down.  However I still could not clearly identify the cystic duct.  We amputated the gallbladder in this area.  There was a bleeding vessel adjacent to this neck of the gallbladder that we ligated with clips.  This appeared to be the cystic artery.  Hemostasis was obtained with cautery as well as Surgicel snow.  An 45 French drain was inserted through the epigastric port and brought out through the lateral port in the right upper quadrant.  This was placed in the gallbladder fossa.  This was secured with 2-0 nylon.  We irrigated thoroughly and inspected for hemostasis.  The pieces of the gallbladder were placed in Ecosac and removed through the umbilicus. The pursestring suture was used to close the umbilical fascia.    We again inspected the right upper quadrant for hemostasis.  Pneumoperitoneum was released as we removed the trocars.  4-0 Monocryl was used to close the skin.   Benzoin, steri-strips, and clean dressings were applied. The patient was then extubated and brought to the recovery room in stable condition. Instrument, sponge, and needle counts were correct at closure and at the conclusion of the case.    Findings: Severe Cholecystitis with Cholelithiasis -   Estimated Blood Loss: 200 mL         Drains: JP drain in GB fossa         Specimens: Gallbladder           Complications: None; patient tolerated the procedure well.         Disposition: PACU - hemodynamically stable.         Condition: stable  Wilmon Arms. Corliss Skains, MD, [facility]  General/ Trauma Surgery   06/01/2019 9:36 AM

## 2019-06-01 NOTE — Anesthesia Postprocedure Evaluation (Signed)
Anesthesia Post Note  Patient: Regan Pucciarelli  Procedure(s) Performed: LAPAROSCOPIC CHOLECYSTECTOMY (N/A Abdomen)     Patient location during evaluation: PACU Anesthesia Type: General Level of consciousness: awake and alert Pain management: pain level controlled Vital Signs Assessment: post-procedure vital signs reviewed and stable Respiratory status: spontaneous breathing, nonlabored ventilation, respiratory function stable and patient connected to nasal cannula oxygen Cardiovascular status: blood pressure returned to baseline and stable Postop Assessment: no apparent nausea or vomiting Anesthetic complications: no    Last Vitals:  Vitals:   06/01/19 1020 06/01/19 1040  BP: 110/69 107/67  Pulse: 98 98  Resp: 20 18  Temp:  36.6 C  SpO2: 93% 93%    Last Pain:  Vitals:   06/01/19 1040  TempSrc: Oral  PainSc: 0-No pain                 Philis Doke DAVID

## 2019-06-01 NOTE — Progress Notes (Signed)
[name], male   DOB: 07/06/1960, 58 y.o.   MRN: [id number]   [facility] PROGRESS NOTE  [facility] WUJ:[id number] DOB: Jun 29, 1960 DOA: 05/30/2019 PCP: Ronnald Nian, MD  Brief Summary: HPI: Martin [name] is a 58 y.o. male with history of hypertension and hyperlipidemia presents to the ER with complaints of abdominal pain with nausea vomiting over the last 2 days.  Pain is mostly in the right upper quadrant.  Denies any diarrhea.  Pain is constant irrespective of the diet.  Denies any blood in the vomitus.  Denies any chest pain or shortness of breath.  ED Course: In the ER patient was found to be in A. fib with RVR for which patient was placed on Cardizem infusion after Cardizem bolus.  COVID-19 test was negative.  Labs show acute renal failure with creatinine having worsened from 1 in April 2019 and presently is around 3.75.  Also lab work show leukocytosis of 17.5.  LFTs were normal.  CT abdomen and pelvis shows features concerning for cholecystitis with possible perforation.  On-call general surgeon Dr. Derrell Lolling was consulted who requested to get sonogram of the abdomen which only shows cholecystitis.  Patient is on empiric antibiotics Cardizem infusion and admitted for further management.  Assessment/Plan: Principal Problem:   Abdominal pain Active Problems:   Hypertension   Hypothyroid   Acute cholecystitis   ARF (acute renal failure) (HCC)   Atrial fibrillation with RVR (HCC)   Acute cholecystitis  Postop day #0 from laparoscopic cholecystectomy  Continue on Zosyn  A. fib with RVR  Remains in A. fib  Heart rate stable on Cardizem  We will try to wean as possible  Once cleared by surgery, start Eliquis  Acute renal failure  Creatinine improving:  1.7 currently.  We will continue to trend and anticipate that this will continue to improve as his oral hydration improves  Hypothyroidism  Patient has normal  TSH  Hypertension  Currently on Cardizem.  Blood pressure normal  Code Status: Full code Family Communication: None Disposition Plan: Patient should be able to return home   Consultants:  General surgery    Procedures:  Laparoscopic cholecystectomy  Antibiotics:  Zosyn, day 3  HPI/Subjective: Patient seen postoperatively after laparoscopic cholecystectomy.  Patient reports his belly is "sore" however improved from prior to surgery.  Still lacks appetite  Objective: Vitals:   06/01/19 1020 06/01/19 1040  BP: 110/69 107/67  Pulse: 98 98  Resp: 20 18  Temp:  97.9 F (36.6 C)  SpO2: 93% 93%    Intake/Output Summary (Last 24 hours) at 06/01/2019 1338 Last data filed at 06/01/2019 1315 Gross per 24 hour  Intake 1800 ml  Output 1075 ml  Net 725 ml   Filed Weights   05/30/19 2200 05/31/19 1715 06/01/19 0300  Weight: 122.5 kg 120.2 kg 119.8 kg    Exam:   General: A&O x3.  No acute distress.  Cardiovascular: Irregularly irregular rate, normal S1-S2 sounds.  Respiratory: Clear to auscultation bilaterally with no wheezes, rales, rhonchi  Abdomen: Mild generalized abdominal tenderness.  Musculoskeletal: No peripheral edema or leg tenderness.  Extremity: Extremities warm to touch with 2+ dorsalis pedis and radial pulses  Data Reviewed: Basic Metabolic Panel: Recent Labs  Lab 05/30/19 1137 05/31/19 0403 06/01/19 0417  NA 140 140 143  K 4.1 4.0 3.9  CL 98 105 106  CO2 GLUCOSE 130* 137* 133*  BUN 56* 69* 52*  CREATININE 3.75* 2.28* 1.72*  CALCIUM  9.1 8.5* 8.6*  MG  --   --  2.8*   Liver Function Tests: Recent Labs  Lab 05/30/19 1137 05/31/19 0403 06/01/19 0417  AST 26 29 39  ALT 35 34 47*  ALKPHOS 72 75 89  BILITOT 1.7* 1.5* 1.5*  PROT 7.8 7.0 7.2  ALBUMIN 3.0* 2.8* 2.5*   Recent Labs  Lab 05/30/19 1137 06/01/19 0417  LIPASE 32 44   No results for input(s): AMMONIA in the last 168 hours. CBC: Recent Labs  Lab  05/30/19 1137 05/31/19 0403 06/01/19 0417  WBC 17.5* 13.1* 10.7*  NEUTROABS  --  10.3* 7.8*  HGB 13.4 12.6* 12.0*  HCT 41.3 40.4 36.7*  MCV 94.3 97.8 92.9  PLT 276 270 298   Cardiac Enzymes: No results for input(s): CKTOTAL, CKMB, CKMBINDEX, TROPONINI in the last 168 hours. BNP (last 3 results) Recent Labs    05/30/19 1600  BNP 75.9    ProBNP (last 3 results) No results for input(s): PROBNP in the last 8760 hours.  CBG: Recent Labs  Lab 05/31/19 2130 06/01/19 0603  GLUCAP 136* 120*    Recent Results (from the past 240 hour(s))  Novel Coronavirus, NAA ([facility])     Status: None   Collection Time: 05/29/19 12:00 AM   Specimen: Nasopharyngeal(NP) swabs in vial transport medium   NASOPHARYNGE  TESTING  Result Value Ref Range Status   SARS-CoV-2, NAA Not Detected Not Detected Final    Comment: This nucleic acid amplification test was developed and its performance characteristics determined by [employer]. Nucleic acid amplification tests include PCR and TMA. This test has not been FDA cleared or approved. This test has been authorized by FDA under an Emergency Use Authorization (EUA). This test is only authorized for the duration of time the declaration that circumstances exist justifying the authorization of the emergency use of in vitro diagnostic tests for detection of SARS-CoV-2 virus and/or diagnosis of COVID-19 infection under section 564(b)(1) of the Act, 21 U.S.C. 409WJX-9(J) (1), unless the authorization is terminated or revoked sooner. When diagnostic testing is negative, the possibility of a false negative result should be considered in the context of a patient's recent exposures and the presence of clinical signs and symptoms consistent with COVID-19. An individual without symptoms of COVID-19 and who is not shedding SARS-CoV-2 virus would  expect to have a negative (not detected) result in this assay.   SARS CORONAVIRUS 2 (TAT 6-24 HRS)  Nasopharyngeal Nasopharyngeal Swab     Status: None   Collection Time: 05/30/19  3:50 PM   Specimen: Nasopharyngeal Swab  Result Value Ref Range Status   SARS Coronavirus 2 NEGATIVE NEGATIVE Final    Comment: (NOTE) SARS-CoV-2 target nucleic acids are NOT DETECTED. The SARS-CoV-2 RNA is generally detectable in upper and lower respiratory specimens during the acute phase of infection. Negative results do not preclude SARS-CoV-2 infection, do not rule out co-infections with other pathogens, and should not be used as the sole basis for treatment or other patient management decisions. Negative results must be combined with clinical observations, patient history, and epidemiological information. The expected result is Negative. Fact Sheet for Patients: HairSlick.no Fact Sheet for Healthcare Providers: quierodirigir.com This test is not yet approved or cleared by the [place] FDA and  has been authorized for detection and/or diagnosis of SARS-CoV-2 by FDA under an Emergency Use Authorization (EUA). This EUA will remain  in effect (meaning this test can be used) for the duration of the COVID-19 declaration under Section 56  4(b)(1) of the Act, 21 U.S.C. section 360bbb-3(b)(1), unless the authorization is terminated or revoked sooner. Performed at [facility], [address], [place], [place] [address]   Surgical PCR screen     Status: None   Collection Time: 05/31/19 10:13 PM   Specimen: Nasal Mucosa; Nasal Swab  Result Value Ref Range Status   MRSA, PCR NEGATIVE NEGATIVE Final   Staphylococcus aureus NEGATIVE NEGATIVE Final    Comment: (NOTE) The Xpert SA Assay (FDA approved for NASAL specimens in patients 36 years of age and older), is one component of a comprehensive surveillance program. It is not intended to diagnose infection nor to guide or monitor treatment. Performed at [facility], [address],  [place], [place] [address]      Studies: CT ABDOMEN PELVIS WO CONTRAST  Result Date: 05/30/2019 CLINICAL DATA:  Acute renal failure EXAM: CT ABDOMEN AND PELVIS WITHOUT CONTRAST TECHNIQUE: Multidetector CT imaging of the abdomen and pelvis was performed following the standard protocol without IV contrast. COMPARISON:  None. FINDINGS: Lower chest: There is atelectasis at the lung bases.The heart size is mildly enlarged Hepatobiliary: There is decreased hepatic attenuation suggestive of hepatic steatosis. There is significant pericholecystic fat stranding and free fluid. The gallbladder is mildly distended. There is a 3.2 x 2.3 cm collection abutting the gallbladder fundus and adjacent:. This is best visualized on the coronal series. Pancreas: Normal contours without ductal dilatation. No peripancreatic fluid collection. Spleen: No splenic laceration or hematoma. Adrenals/Urinary Tract: --Adrenal glands: No adrenal hemorrhage. --Right kidney/ureter: No hydronephrosis or perinephric hematoma. --Left kidney/ureter: No hydronephrosis or perinephric hematoma. --Urinary bladder: Unremarkable. Stomach/Bowel: --Stomach/Duodenum: No hiatal hernia or other gastric abnormality. Normal duodenal course and caliber. --Small bowel: No dilatation or inflammation. --Colon: No focal abnormality. --Appendix: Normal. Vascular/Lymphatic: Atherosclerotic calcification is present within the non-aneurysmal abdominal aorta, without hemodynamically significant stenosis. --No retroperitoneal lymphadenopathy. --No mesenteric lymphadenopathy. --No pelvic or inguinal lymphadenopathy. Reproductive: Unremarkable Other: No ascites or free air. There are bilateral fat containing inguinal hernias. Musculoskeletal. No acute displaced fractures. IMPRESSION: 1. Pericholecystic fat stranding and free fluid, consistent with acute cholecystitis in the appropriate clinical setting. There is a 3.2 x 2.3 cm collection abutting the gallbladder fundus and the  colon, raising concern for perforation. This is suboptimally evaluated in the absence of IV contrast. 2. Hepatic steatosis. 3. Bilateral fat containing inguinal hernias. 4. No hydronephrosis.  No radiopaque obstructing kidney stones. Aortic Atherosclerosis (ICD10-I70.0). Electronically Signed   By: Katherine Mantle M.D.   On: 05/30/2019 20:13   DG Chest Port 1 View  Result Date: 05/30/2019 CLINICAL DATA:  Shortness of breath. EXAM: PORTABLE CHEST 1 VIEW COMPARISON:  Oct 24, 2016 FINDINGS: Minimal atelectasis in the left base. No pneumothorax. The heart, hila, mediastinum, lungs, and pleura are normal. IMPRESSION: No acute abnormalities. Electronically Signed   By: Gerome Sam III M.D   On: 05/30/2019 14:41   ECHOCARDIOGRAM COMPLETE  Result Date: 05/31/2019   ECHOCARDIOGRAM REPORT   Patient Name:   CLEAVEN DEMARIO Date of Exam: 05/31/2019 Medical Rec #:  [id number]         Height:       73.0 in Accession #:    [id number]        Weight:       270.0 lb Date of Birth:  [birth year]          BSA:          2.44 m Patient Age:    58 years  BP:           20/92 mmHg Patient Gender: M                 HR:           103 bpm. Exam Location:  Inpatient Procedure: 2D Echo Indications:    Atrial Fibrillation 427.31 / I48.91  History:        Patient has no prior history of Echocardiogram examinations.                 Arrythmias:Atrial Fibrillation; Risk Factors:Hypertension,                 Dyslipidemia and Non-Smoker. Acute Renal Failure, LVH.  Sonographer:    Jeryl ColumbiaJohanna Elliott Referring Phys: [id number] ARSHAD N KAKRAKANDY IMPRESSIONS  1. Left ventricular ejection fraction, by visual estimation, is 55 to 60%. The left ventricle has normal function. There is mildly increased left ventricular hypertrophy.  2. Left ventricular diastolic function could not be evaluated.  3. The left ventricle has no regional wall motion abnormalities.  4. Global right ventricle has normal systolic function.The right ventricular size is  normal. No increase in right ventricular wall thickness.  5. Left atrial size was normal.  6. Right atrial size was normal.  7. Presence of pericardial fat pad.  8. Mild mitral annular calcification.  9. The mitral valve is degenerative. Trivial mitral valve regurgitation. 10. The tricuspid valve is grossly normal. Tricuspid valve regurgitation is trivial. 11. The aortic valve is tricuspid. Aortic valve regurgitation is not visualized. 12. The pulmonic valve was grossly normal. Pulmonic valve regurgitation is not visualized. 13. Normal pulmonary artery systolic pressure. 14. The inferior vena cava is normal in size with greater than 50% respiratory variability, suggesting right atrial pressure of 3 mmHg. 15. No prior Echocardiogram. FINDINGS  Left Ventricle: Left ventricular ejection fraction, by visual estimation, is 55 to 60%. The left ventricle has normal function. The left ventricle has no regional wall motion abnormalities. There is mildly increased left ventricular hypertrophy. Concentric left ventricular hypertrophy. The left ventricular diastology could not be evaluated due to atrial fibrillation. Left ventricular diastolic function could not be evaluated. Right Ventricle: The right ventricular size is normal. No increase in right ventricular wall thickness. Global RV systolic function is has normal systolic function. The tricuspid regurgitant velocity is 2.36 m/s, and with an assumed right atrial pressure  of 3 mmHg, the estimated right ventricular systolic pressure is normal at 25.3 mmHg. Left Atrium: Left atrial size was normal in size. Right Atrium: Right atrial size was normal in size Pericardium: There is no evidence of pericardial effusion. Presence of pericardial fat pad. Mitral Valve: The mitral valve is degenerative in appearance. Mild mitral annular calcification. Trivial mitral valve regurgitation. Tricuspid Valve: The tricuspid valve is grossly normal. Tricuspid valve regurgitation is trivial.  Aortic Valve: The aortic valve is tricuspid. Aortic valve regurgitation is not visualized. There is moderate calcification of the aortic valve. Pulmonic Valve: The pulmonic valve was grossly normal. Pulmonic valve regurgitation is not visualized. Pulmonic regurgitation is not visualized. Aorta: The aortic root and ascending aorta are structurally normal, with no evidence of dilitation. Venous: The inferior vena cava is normal in size with greater than 50% respiratory variability, suggesting right atrial pressure of 3 mmHg. IAS/Shunts: No atrial level shunt detected by color flow Doppler.  LEFT VENTRICLE PLAX 2D LVIDd:         5.20 cm  Diastology LVIDs:  3.50 cm  LV e' lateral:   12.10 cm/s LV PW:         1.60 cm  LV E/e' lateral: 4.2 LV IVS:        1.40 cm  LV e' medial:    10.60 cm/s LVOT diam:     2.20 cm  LV E/e' medial:  4.8 LV SV:         79 ml LV SV Index:   30.81 LVOT Area:     3.80 cm  RIGHT VENTRICLE TAPSE (M-mode): 1.9 cm LEFT ATRIUM             Index       RIGHT ATRIUM           Index LA diam:        3.70 cm 1.51 cm/m  RA Area:     11.50 cm LA Vol (A2C):   42.0 ml 17.18 ml/m RA Volume:   24.80 ml  10.15 ml/m LA Vol (A4C):   33.3 ml 13.62 ml/m LA Biplane Vol: 39.8 ml 16.28 ml/m   AORTA Ao Root diam: 3.00 cm MITRAL VALVE                        TRICUSPID VALVE MV Area (PHT): 2.93 cm             TR Peak grad:   22.3 mmHg MV PHT:        75.11 msec           TR Vmax:        236.00 cm/s MV Decel Time: 259 msec MV E velocity: 51.00 cm/s 103 cm/s  SHUNTS MV A velocity: 22.70 cm/s 70.3 cm/s Systemic Diam: 2.20 cm MV E/A ratio:  2.25       1.5  Eleonore Chiquito MD Electronically signed by Eleonore Chiquito MD Signature Date/Time: 05/31/2019/12:12:14 PM    Final    US Abdomen Limited RUQ  Result Date: 05/30/2019 CLINICAL DATA:  Pain. Additional history provided by scanning technologist: Right upper quadrant pain for 2 days. EXAM: ULTRASOUND ABDOMEN LIMITED RIGHT UPPER QUADRANT COMPARISON:  CT  abdomen/pelvis performed earlier the same day 05/30/2019 FINDINGS: Gallbladder: Cholecystolithiasis. Gallbladder wall thickening to 5 mm with pericholecystic fluid. Additionally, a sonographic Percell Miller sign is elicited by the scanning technologist. Common bile duct: Diameter: The visualized common duct is dilated to 8 mm in diameter. Liver: No focal lesion identified. Increased hepatic parenchymal echogenicity. Interrogated portal vein is patent on color Doppler imaging with normal direction of blood flow towards the liver. IMPRESSION: Cholecystolithiasis with gallbladder wall thickening and pericholecystic fluid. Additionally, a sonographic Percell Miller sign is elicited by the scanning technologist. Findings are consistent with acute cholecystitis in the appropriate clinical setting. Extrahepatic biliary ductal dilatation with the visualized common duct measuring 8 mm in diameter. Increased hepatic parenchymal echogenicity. This finding is nonspecific, but most commonly seen in the setting of hepatic steatosis. Electronically Signed   By: Kellie Simmering DO   On: 05/30/2019 22:29    Scheduled Meds:  acetaminophen  1,000 mg Oral Q8H   Continuous Infusions:  sodium chloride     sodium chloride 75 mL/hr at 06/01/19 1107   diltiazem (CARDIZEM) infusion 15 mg/hr (06/01/19 1253)   piperacillin-tazobactam (ZOSYN)  IV 3.375 g (05/31/19 2157)      Time spent: [place], DO    06/01/2019, 1:38 PM  LOS: 2 days

## 2019-06-01 NOTE — Anesthesia Procedure Notes (Signed)
Procedure Name: Intubation Date/Time: 06/01/2019 7:41 AM Performed by: Colin Benton, CRNA Pre-anesthesia Checklist: Patient identified, Emergency Drugs available, Suction available and Patient being monitored Patient Re-evaluated:Patient Re-evaluated prior to induction Oxygen Delivery Method: Circle system utilized Preoxygenation: Pre-oxygenation with 100% oxygen Induction Type: IV induction Ventilation: Oral airway inserted - appropriate to patient size and Mask ventilation without difficulty Laryngoscope Size: Mac and 4 Grade View: Grade II Tube type: Oral Tube size: 7.5 mm Number of attempts: 1 Airway Equipment and Method: Stylet Placement Confirmation: ETT inserted through vocal cords under direct vision,  positive ETCO2 and breath sounds checked- equal and bilateral Secured at: 21 cm Tube secured with: Tape Dental Injury: Teeth and Oropharynx as per pre-operative assessment

## 2019-06-01 NOTE — Progress Notes (Signed)
Pt's HR up to 160s while pt ambulating to/using the bathroom. HR 110s-120s at rest. BP stable. Cardizem drip increased to 12.5mg .hr. Will continue to monitor.

## 2019-06-01 NOTE — Anesthesia Preprocedure Evaluation (Addendum)
Anesthesia Evaluation  Patient identified by MRN, date of birth, ID band Patient awake    Reviewed: Allergy & Precautions, NPO status , Patient's Chart, lab work & pertinent test results  Airway Mallampati: II  TM Distance: >3 FB Neck ROM: Full    Dental   Pulmonary    Pulmonary exam normal        Cardiovascular hypertension, Pt. on medications Normal cardiovascular exam     Neuro/Psych    GI/Hepatic   Endo/Other    Renal/GU      Musculoskeletal   Abdominal   Peds  Hematology   Anesthesia Other Findings   Reproductive/Obstetrics                             Anesthesia Physical Anesthesia Plan  ASA: II  Anesthesia Plan: General   Post-op Pain Management:    Induction: Intravenous  PONV Risk Score and Plan: 2 and Ondansetron and Midazolam  Airway Management Planned: Oral ETT  Additional Equipment:   Intra-op Plan:   Post-operative Plan: Extubation in OR  Informed Consent: I have reviewed the patients History and Physical, chart, labs and discussed the procedure including the risks, benefits and alternatives for the proposed anesthesia with the patient or authorized representative who has indicated his/her understanding and acceptance.     Dental advisory given  Plan Discussed with: CRNA and Surgeon  Anesthesia Plan Comments:        Anesthesia Quick Evaluation

## 2019-06-02 LAB — COMPREHENSIVE METABOLIC PANEL
ALT: 74 U/L — ABNORMAL HIGH (ref 0–44)
AST: 52 U/L — ABNORMAL HIGH (ref 15–41)
Albumin: 2.5 g/dL — ABNORMAL LOW (ref 3.5–5.0)
Alkaline Phosphatase: 84 U/L (ref 38–126)
Anion gap: 11 (ref 5–15)
BUN: 36 mg/dL — ABNORMAL HIGH (ref 6–20)
CO2: 25 mmol/L (ref 22–32)
Calcium: 8.4 mg/dL — ABNORMAL LOW (ref 8.9–10.3)
Chloride: 107 mmol/L (ref 98–111)
Creatinine, Ser: 1.62 mg/dL — ABNORMAL HIGH (ref 0.61–1.24)
GFR calc Af Amer: 53 mL/min — ABNORMAL LOW (ref >60)
GFR calc non Af Amer: 45 mL/min — ABNORMAL LOW (ref >60)
Glucose, Bld: 118 mg/dL — ABNORMAL HIGH (ref 70–99)
Potassium: 4.2 mmol/L (ref 3.5–5.1)
Sodium: 143 mmol/L (ref 135–145)
Total Bilirubin: 0.9 mg/dL (ref 0.3–1.2)
Total Protein: 6.8 g/dL (ref 6.5–8.1)

## 2019-06-02 LAB — CBC
HCT: 38.2 % — ABNORMAL LOW (ref 39.0–52.0)
Hemoglobin: 12.1 g/dL — ABNORMAL LOW (ref 13.0–17.0)
MCH: 30 pg (ref 26.0–34.0)
MCHC: 31.7 g/dL (ref 30.0–36.0)
MCV: 94.6 fL (ref 80.0–100.0)
Platelets: 294 10*3/uL (ref 150–400)
RBC: 4.04 MIL/uL — ABNORMAL LOW (ref 4.22–5.81)
RDW: 14.8 % (ref 11.5–15.5)
WBC: 10.5 10*3/uL (ref 4.0–10.5)
nRBC: 0 % (ref 0.0–0.2)

## 2019-06-02 LAB — SURGICAL PATHOLOGY

## 2019-06-02 MED ORDER — METOPROLOL TARTRATE 25 MG PO TABS
25.0000 mg | ORAL_TABLET | Freq: Two times a day (BID) | ORAL | Status: DC
  Administered 2019-06-02 (×2): 25 mg via ORAL
  Filled 2019-06-02 (×2): qty 1

## 2019-06-02 NOTE — Progress Notes (Addendum)
[facility]/Trauma Progress Note  1 Day Post-Op   Assessment/Plan LVH CKD  Hypertension Hypothyroid Hyperlipidemia Atrial fibrillation with RVR - new *above per medicine*  Acute cholecystitis - S/P laparoscopic partial cholecystectomy, Dr. Georgette Dover, 12/10 - JP drain is sanguinous, continue, will be discharged with drain - pain improved, he is tolerating a diet - recheck labs in am  FEN: HH diet ID: Zosyn 12/8 >> DVT: SCDs Follow-up: [facility]  Plan:  recheck labs in am     LOS: 3 days    Subjective: CC: abdominal soreness  No issues overnight. Pt is eating breakfast and denies nausea or vomiting. He is urinating without issues. He has not been out of bed since surgery. He lives with his wife. We discussed discharge instructions. He expressed understanding.   Objective: Vital signs in last 24 hours: Temp:  [97.5 F (36.4 C)-98.2 F (36.8 C)] 98.1 F (36.7 C) (12/11 0512) Pulse Rate:  [84-103] 86 (12/11 0512) Resp:  [16-20] 16 (12/11 0512) BP: (107-127)/(67-97) 127/97 (12/11 0512) SpO2:  [93 %-98 %] 96 % (12/11 0512) Weight:  [121.5 kg] 121.5 kg (12/11 0512) Last BM Date: 05/31/19  Intake/Output from previous day: 12/10 0701 - 12/11 0700 In: 3044.4 [P.O.:1333; I.V.:1227.8; IV Piggyback:428.6] Out: 1670 [Urine:1550; Drains:70; Blood:50] Intake/Output this shift: No intake/output data recorded.  PE:  Gen:  Alert, NAD, pleasant, cooperative Pulm:  Rate and effort normal Abd: Soft, obese, ND, +BS, incisions C/D/I, drain with minimal sanguinous drainage, mild RUQ TTP without guarding. No peritonitis  Skin: no rashes noted, warm and dry   Anti-infectives: Anti-infectives (From admission, onward)   Start     Dose/Rate Route Frequency Ordered Stop   05/31/19 0300  piperacillin-tazobactam (ZOSYN) IVPB 3.375 g     3.375 g 12.5 mL/hr over 240 Minutes Intravenous Every 8 hours 05/30/19 2242     05/30/19 2030  piperacillin-tazobactam (ZOSYN) IVPB  3.375 g     3.375 g 100 mL/hr over 30 Minutes Intravenous  Once 05/30/19 2022 05/30/19 2316      Lab Results:  Recent Labs    06/01/19 0417 06/02/19 0406  WBC 10.7* 10.5  HGB 12.0* 12.1*  HCT 36.7* 38.2*  PLT 298 294   BMET Recent Labs    06/01/19 0417 06/02/19 0406  NA 143 143  K 3.9 4.2  CL 106 107  CO2 25 25  GLUCOSE 133* 118*  BUN 52* 36*  CREATININE 1.72* 1.62*  CALCIUM 8.6* 8.4*   PT/INR No results for input(s): LABPROT, INR in the last 72 hours. CMP     Component Value Date/Time   NA 143 06/02/2019 0406   NA 145 (H) 10/06/2017 1142   K 4.2 06/02/2019 0406   CL 107 06/02/2019 0406   CO2 25 06/02/2019 0406   GLUCOSE 118 (H) 06/02/2019 0406   BUN 36 (H) 06/02/2019 0406   BUN 17 10/06/2017 1142   CREATININE 1.62 (H) 06/02/2019 0406   CREATININE 1.18 04/03/2016 0754   CALCIUM 8.4 (L) 06/02/2019 0406   PROT 6.8 06/02/2019 0406   PROT 7.3 10/06/2017 1142   ALBUMIN 2.5 (L) 06/02/2019 0406   ALBUMIN 4.4 10/06/2017 1142   AST 52 (H) 06/02/2019 0406   ALT 74 (H) 06/02/2019 0406   ALKPHOS 84 06/02/2019 0406   BILITOT 0.9 06/02/2019 0406   BILITOT 0.8 10/06/2017 1142   GFRNONAA 45 (L) 06/02/2019 0406   GFRAA 53 (L) 06/02/2019 0406   Lipase     Component Value Date/Time   LIPASE 44  06/01/2019 0417    Studies/Results: ECHOCARDIOGRAM COMPLETE  Result Date: 05/31/2019   ECHOCARDIOGRAM REPORT   Patient Name:   Martin [name] Date of Exam: 05/31/2019 Medical Rec #:  [id number]         Height:       73.0 in Accession #:    [id number]        Weight:       270.0 lb Date of Birth:  05/09/1961          BSA:          2.44 m Patient Age:    58 years          BP:           20/92 mmHg Patient Gender: M                 HR:           103 bpm. Exam Location:  Inpatient Procedure: 2D Echo Indications:    Atrial Fibrillation 427.31 / I48.91  History:        Patient has no prior history of Echocardiogram examinations.                 Arrythmias:Atrial Fibrillation; Risk  Factors:Hypertension,                 Dyslipidemia and Non-Smoker. Acute Renal Failure, LVH.  Sonographer:    Jeryl ColumbiaJohanna Elliott Referring Phys: [id number] ARSHAD N KAKRAKANDY IMPRESSIONS  1. Left ventricular ejection fraction, by visual estimation, is 55 to 60%. The left ventricle has normal function. There is mildly increased left ventricular hypertrophy.  2. Left ventricular diastolic function could not be evaluated.  3. The left ventricle has no regional wall motion abnormalities.  4. Global right ventricle has normal systolic function.The right ventricular size is normal. No increase in right ventricular wall thickness.  5. Left atrial size was normal.  6. Right atrial size was normal.  7. Presence of pericardial fat pad.  8. Mild mitral annular calcification.  9. The mitral valve is degenerative. Trivial mitral valve regurgitation. 10. The tricuspid valve is grossly normal. Tricuspid valve regurgitation is trivial. 11. The aortic valve is tricuspid. Aortic valve regurgitation is not visualized. 12. The pulmonic valve was grossly normal. Pulmonic valve regurgitation is not visualized. 13. Normal pulmonary artery systolic pressure. 14. The inferior vena cava is normal in size with greater than 50% respiratory variability, suggesting right atrial pressure of 3 mmHg. 15. No prior Echocardiogram. FINDINGS  Left Ventricle: Left ventricular ejection fraction, by visual estimation, is 55 to 60%. The left ventricle has normal function. The left ventricle has no regional wall motion abnormalities. There is mildly increased left ventricular hypertrophy. Concentric left ventricular hypertrophy. The left ventricular diastology could not be evaluated due to atrial fibrillation. Left ventricular diastolic function could not be evaluated. Right Ventricle: The right ventricular size is normal. No increase in right ventricular wall thickness. Global RV systolic function is has normal systolic function. The tricuspid regurgitant velocity  is 2.36 m/s, and with an assumed right atrial pressure  of 3 mmHg, the estimated right ventricular systolic pressure is normal at 25.3 mmHg. Left Atrium: Left atrial size was normal in size. Right Atrium: Right atrial size was normal in size Pericardium: There is no evidence of pericardial effusion. Presence of pericardial fat pad. Mitral Valve: The mitral valve is degenerative in appearance. Mild mitral annular calcification. Trivial mitral valve regurgitation. Tricuspid Valve: The tricuspid valve is grossly normal. Tricuspid valve regurgitation is  trivial. Aortic Valve: The aortic valve is tricuspid. Aortic valve regurgitation is not visualized. There is moderate calcification of the aortic valve. Pulmonic Valve: The pulmonic valve was grossly normal. Pulmonic valve regurgitation is not visualized. Pulmonic regurgitation is not visualized. Aorta: The aortic root and ascending aorta are structurally normal, with no evidence of dilitation. Venous: The inferior vena cava is normal in size with greater than 50% respiratory variability, suggesting right atrial pressure of 3 mmHg. IAS/Shunts: No atrial level shunt detected by color flow Doppler.  LEFT VENTRICLE PLAX 2D LVIDd:         5.20 cm  Diastology LVIDs:         3.50 cm  LV e' lateral:   12.10 cm/s LV PW:         1.60 cm  LV E/e' lateral: 4.2 LV IVS:        1.40 cm  LV e' medial:    10.60 cm/s LVOT diam:     2.20 cm  LV E/e' medial:  4.8 LV SV:         79 ml LV SV Index:   30.81 LVOT Area:     3.80 cm  RIGHT VENTRICLE TAPSE (M-mode): 1.9 cm LEFT ATRIUM             Index       RIGHT ATRIUM           Index LA diam:        3.70 cm 1.51 cm/m  RA Area:     11.50 cm LA Vol (A2C):   42.0 ml 17.18 ml/m RA Volume:   24.80 ml  10.15 ml/m LA Vol (A4C):   33.3 ml 13.62 ml/m LA Biplane Vol: 39.8 ml 16.28 ml/m   AORTA Ao Root diam: 3.00 cm MITRAL VALVE                        TRICUSPID VALVE MV Area (PHT): 2.93 cm             TR Peak grad:   22.3 mmHg MV PHT:         75.11 msec           TR Vmax:        236.00 cm/s MV Decel Time: 259 msec MV E velocity: 51.00 cm/s 103 cm/s  SHUNTS MV A velocity: 22.70 cm/s 70.3 cm/s Systemic Diam: 2.20 cm MV E/A ratio:  2.25       1.5  Lennie Odor MD Electronically signed by Lennie Odor MD Signature Date/Time: 05/31/2019/12:12:14 PM    Final      Jerre Simon, [facility] Please see amion for pager for the following: M, T, W, & Friday 7:00am - 4:30pm Thursdays 7:00am -11:30am

## 2019-06-02 NOTE — Discharge Instructions (Addendum)
[facility] ______[facility] LAPAROSCOPIC SURGERY: POST OP INSTRUCTIONS Always review your discharge instruction sheet given to you by the facility where your surgery was performed. IF YOU HAVE DISABILITY OR FAMILY LEAVE FORMS, YOU MUST BRING THEM TO THE OFFICE FOR PROCESSING.   DO NOT GIVE THEM TO YOUR DOCTOR.  1. A prescription for pain medication may be given to you upon discharge.  Take your pain medication as prescribed, if needed.  If narcotic pain medicine is not needed, then you may take acetaminophen (Tylenol) or ibuprofen (Advil) as needed. 2. Take your usually prescribed medications unless otherwise directed. 3. If you need a refill on your pain medication, please contact your pharmacy.  They will contact our office to request authorization. Prescriptions will not be filled after 5pm or on week-ends. 4. You should follow a light diet the first few days after arrival home, such as soup and crackers, etc.  Be sure to include lots of fluids daily. 5. Most patients will experience some swelling and bruising in the area of the incisions.  Ice packs will help.  Swelling and bruising can take several days to resolve.  6. It is common to experience some constipation if taking pain medication after surgery.  Increasing fluid intake and taking a stool softener (such as Colace) will usually help or prevent this problem from occurring.  A mild laxative (Milk of Magnesia or Miralax) should be taken according to package instructions if there are no bowel movements after 48 hours. 7. Unless discharge instructions indicate otherwise, you may remove your bandages 24-48 hours after surgery, and you may shower at that time.  You may have steri-strips (small skin tapes) in place directly over the incision.  These strips should be left on the skin for 7-10 days.  If your surgeon used skin glue on the incision, you may shower in 24 hours.  The glue will flake off over the next 2-3 weeks.  Any sutures or  staples will be removed at the office during your follow-up visit. 8. ACTIVITIES:  You may resume regular (light) daily activities beginning the next day--such as daily self-care, walking, climbing stairs--gradually increasing activities as tolerated.  You may have sexual intercourse when it is comfortable.  Refrain from any heavy lifting or straining until approved by your doctor. a. You may drive when you are no longer taking prescription pain medication, you can comfortably wear a seatbelt, and you can safely maneuver your car and apply brakes. b. RETURN TO WORK:  __________________________________________________________ 9. You should see your doctor in the office for a follow-up appointment approximately 2-3 weeks after your surgery.  Make sure that you call for this appointment within a day or two after you arrive home to insure a convenient appointment time. 10. OTHER INSTRUCTIONS: __________________________________________________________________________________________________________________________ __________________________________________________________________________________________________________________________ WHEN TO CALL YOUR DOCTOR: 1. Fever over 101.0 2. Inability to urinate 3. Continued bleeding from incision. 4. Increased pain, redness, or drainage from the incision. 5. Worsening abdominal pain, green output from drain.    The clinic staff is available to answer your questions during regular business hours.  Please dont hesitate to call and ask to speak to one of the nurses for clinical concerns.  If you have a medical emergency, go to the nearest emergency room or call 911.  A surgeon from [facility] is always on call at the hospital. [address] [place], [place], [place]  [address] ? [address], [place], [place]   [id number] [phone] ? [phone] ?  FAX [phone] Web site: [email or web address]    [facility]  Care Surgical drains are used to remove extra fluid that normally builds up in a surgical wound after surgery. A surgical drain helps to heal a surgical wound. Different kinds of surgical drains include:  Active drains. These drains use suction to pull drainage away from the surgical wound. Drainage flows through a tube to a container outside of the body. With these drains, you need to keep the bulb or the drainage container flat (compressed) at all times, except while you empty it. Flattening the bulb or container creates suction.  Passive drains. These drains allow fluid to drain naturally, by gravity. Drainage flows through a tube to a bandage (dressing) or a container outside of the body. Passive drains do not need to be emptied. A drain is placed during surgery. Right after surgery, drainage is usually bright red and a little thicker than water. The drainage may gradually turn yellow or pink and become thinner. It is likely that your health care provider will remove the drain when the drainage stops or when the amount decreases to 1-2 Tbsp (15-30 mL) during a 24-hour period. Supplies needed:  Tape.  Germ-free cleaning solution (sterile saline).  Cotton swabs.  Split gauze drain sponge: 4 x 4 inches (10 x 10 cm).  Gauze square: 4 x 4 inches (10 x 10 cm). How to care for your surgical drain Care for your drain as told by your health care provider. This is important to help prevent infection. If your drain is placed at your back, or any other hard-to-reach area, ask another person to assist you in performing the following tasks: General care  Keep the skin around the drain dry and covered with a dressing at all times.  Check your drain area every day for signs of infection. Check for: ? Redness, swelling, or pain. ? Pus or a bad smell. ? Cloudy drainage. ? Tenderness or pressure at the drain exit site. Changing the dressing Follow instructions from your health care provider about how  to change your dressing. Change your dressing at least once a day. Change it more often if needed to keep the dressing dry. Make sure you: 1. Gather your supplies. 2. Wash your hands with soap and water before you change your dressing. If soap and water are not available, use hand sanitizer. 3. Remove the old dressing. Avoid using scissors to do that. 4. Wash your hands with soap and water again after removing the old dressing. 5. Use sterile saline to clean your skin around the drain. You may need to use a cotton swab to clean the skin. 6. Place the tube through the slit in a drain sponge. Place the drain sponge so that it covers your wound. 7. Place the gauze square or another drain sponge on top of the drain sponge that is on the wound. Make sure the tube is between those layers. 8. Tape the dressing to your skin. 9. Tape the drainage tube to your skin 1-2 inches (2.5-5 cm) below the place where the tube enters your body. Taping keeps the tube from pulling on any stitches (sutures) that you have. 10. Wash your hands with soap and water. 11. Write down the color of your drainage and how often you change your dressing. How to empty your active drain  1. Make sure that you have a measuring cup that you can empty your drainage into. 2. Wash your hands with soap and water. If  soap and water are not available, use hand sanitizer. 3. Loosen any pins or clips that hold the tube in place. 4. If your health care provider tells you to strip the tube to prevent clots and tube blockages: ? Hold the tube at the skin with one hand. Use your other hand to pinch the tubing with your thumb and first finger. ? Gently move your fingers down the tube while squeezing very lightly. This clears any drainage, clots, or tissue from the tube. ? You may need to do this several times each day to keep the tube clear. Do not pull on the tube. 5. Open the bulb cap or the drain plug. Do not touch the inside of the cap or the  bottom of the plug. 6. Turn the device upside down and gently squeeze. 7. Empty all of the drainage into the measuring cup. 8. Compress the bulb or the container and replace the cap or the plug. To compress the bulb or the container, squeeze it firmly in the middle while you close the cap or plug the container. 9. Write down the amount of drainage that you have in each 24-hour period. If you have less than 2 Tbsp (30 mL) of drainage during 24 hours, contact your health care provider. 10. Flush the drainage down the toilet. 11. Wash your hands with soap and water. Contact a health care provider if:  You have redness, swelling, or pain around your drain area.  You have pus or a bad smell coming from your drain area.  You have a fever or chills.  The skin around your drain is warm to the touch.  The amount of drainage that you have is increasing instead of decreasing.  You have drainage that is cloudy.  There is a sudden stop or a sudden decrease in the amount of drainage that you have.  Your drain tube falls out.  Your active drain does not stay compressed after you empty it. Summary  Surgical drains are used to remove extra fluid that normally builds up in a surgical wound after surgery.  Different kinds of surgical drains include active drains and passive drains. Active drains use suction to pull drainage away from the surgical wound, and passive drains allow fluid to drain naturally.  It is important to care for your drain to prevent infection. If your drain is placed at your back, or any other hard-to-reach area, ask another person to assist you.  Contact your health care provider if you have redness, swelling, or pain around your drain area. This information is not intended to replace advice given to you by your health care provider. Make sure you discuss any questions you have with your health care provider. Document Released: 06/05/2000 Document Revised: 07/13/2018 Document  Reviewed: 07/13/2018 Elsevier Patient Education  2020 [employer]

## 2019-06-02 NOTE — Progress Notes (Signed)
CCMD reports HR up to 170-180s. Pt using the bathroom when RN checked on the pt. HR in the 130s after pt returned to bed. VS taken. Scheduled Metoprolol given. Will continue to monitor.

## 2019-06-02 NOTE — Progress Notes (Signed)
[name], male   DOB: 23-Aug-1960, 58 y.o.   MRN: [id number]   [facility] PROGRESS NOTE  [facility] LGX:[id number] DOB: [birth year] DOA: 05/30/2019 PCP: [name], MD  Brief Summary: HPI: Martin [name] is a 58 y.o. male with history of hypertension and hyperlipidemia presents to the ER with complaints of abdominal pain with nausea vomiting over the last 2 days.  Pain is mostly in the right upper quadrant.  Denies any diarrhea.  Pain is constant irrespective of the diet.  Denies any blood in the vomitus.  Denies any chest pain or shortness of breath.  ED Course: In the ER patient was found to be in A. fib with RVR for which patient was placed on Cardizem infusion after Cardizem bolus.  COVID-19 test was negative.  Labs show acute renal failure with creatinine having worsened from 1 in April 2019 and presently is around 3.75.  Also lab work show leukocytosis of 17.5.  LFTs were normal.  CT abdomen and pelvis shows features concerning for cholecystitis with possible perforation.  On-call general surgeon Dr. Rosendo Gros was consulted who requested to get sonogram of the abdomen which only shows cholecystitis.  Patient is on empiric antibiotics Cardizem infusion and admitted for further management.  Assessment/Plan: Principal Problem:   Abdominal pain Active Problems:   Hypertension   Hypothyroid   Acute cholecystitis   ARF (acute renal failure) (HCC)   Atrial fibrillation with RVR (HCC)   Acute cholecystitis  Postop day #1 from laparoscopic cholecystectomy  Continue on Zosyn  A. fib with RVR  Remains in A. fib  Heart rate stable on Cardizem  We will try to wean as possible  Once cleared by surgery, start Eliquis  Acute renal failure  Creatinine improving:  1.7 currently.  We will continue to trend and anticipate that this will continue to improve as his oral hydration improves  Hypothyroidism  Patient has normal  TSH  Hypertension  Transitioned to beta-blocker.  Code Status: Full code Family Communication: None Disposition Plan: Patient should be able to return home   Consultants:  General surgery    Procedures:  Laparoscopic cholecystectomy  Antibiotics:  Zosyn, day 3  HPI/Subjective: Patient doing well.  Appetite is good and tolerating diet.  He has been out of bed and ambulating.  Objective: Vitals:   06/02/19 0851 06/02/19 1213  BP: (!) 122/97 116/82  Pulse: 91 71  Resp: 18 (!) 23  Temp: 98 F (36.7 C) 97.9 F (36.6 C)  SpO2: 95% 97%    Intake/Output Summary (Last 24 hours) at 06/02/2019 1359 Last data filed at 06/02/2019 1228 Gross per 24 hour  Intake 1604.37 ml  Output 1120 ml  Net 484.37 ml   Filed Weights   05/31/19 1715 06/01/19 0300 06/02/19 0512  Weight: 120.2 kg 119.8 kg 121.5 kg    Exam:   General: A&O x3.  No acute distress.  Cardiovascular: Irregularly irregular rate, normal S1-S2 sounds.  Respiratory: Clear to auscultation bilaterally with no wheezes, rales, rhonchi  Abdomen: Mild generalized abdominal tenderness.  Incisions clean, dry, intact.  Musculoskeletal: No peripheral edema or leg tenderness.  Extremity: Extremities warm to touch with 2+ dorsalis pedis and radial pulses  Data Reviewed: Basic Metabolic Panel: Recent Labs  Lab 05/30/19 1137 05/31/19 0403 06/01/19 0417 06/02/19 0406  NA 140 140 143 143  K 4.1 4.0 3.9 4.2  CL 98 105 106 107  CO2 27 22 25 25   GLUCOSE 130* 137* 133* 118*  BUN 56*  69* 52* 36*  CREATININE 3.75* 2.28* 1.72* 1.62*  CALCIUM 9.1 8.5* 8.6* 8.4*  MG  --   --  2.8*  --    Liver Function Tests: Recent Labs  Lab 05/30/19 1137 05/31/19 0403 06/01/19 0417 06/02/19 0406  AST 26 29 39 52*  ALT 35 34 47* 74*  ALKPHOS 72 75 89 84  BILITOT 1.7* 1.5* 1.5* 0.9  PROT 7.8 7.0 7.2 6.8  ALBUMIN 3.0* 2.8* 2.5* 2.5*   Recent Labs  Lab 05/30/19 1137 06/01/19 0417  LIPASE 32 44   No results for  input(s): AMMONIA in the last 168 hours. CBC: Recent Labs  Lab 05/30/19 1137 05/31/19 0403 06/01/19 0417 06/02/19 0406  WBC 17.5* 13.1* 10.7* 10.5  NEUTROABS  --  10.3* 7.8*  --   HGB 13.4 12.6* 12.0* 12.1*  HCT 41.3 40.4 36.7* 38.2*  MCV 94.3 97.8 92.9 94.6  PLT 276 270 298 294   Cardiac Enzymes: No results for input(s): CKTOTAL, CKMB, CKMBINDEX, TROPONINI in the last 168 hours. BNP (last 3 results) Recent Labs    05/30/19 1600  BNP 75.9    ProBNP (last 3 results) No results for input(s): PROBNP in the last 8760 hours.  CBG: Recent Labs  Lab 05/31/19 2130 06/01/19 0603  GLUCAP 136* 120*    Recent Results (from the past 240 hour(s))  Novel Coronavirus, NAA ([facility])     Status: None   Collection Time: 05/29/19 12:00 AM   Specimen: Nasopharyngeal(NP) swabs in vial transport medium   NASOPHARYNGE  TESTING  Result Value Ref Range Status   SARS-CoV-2, NAA Not Detected Not Detected Final    Comment: This nucleic acid amplification test was developed and its performance characteristics determined by [facility]. Nucleic acid amplification tests include PCR and TMA. This test has not been FDA cleared or approved. This test has been authorized by FDA under an Emergency Use Authorization (EUA). This test is only authorized for the duration of time the declaration that circumstances exist justifying the authorization of the emergency use of in vitro diagnostic tests for detection of SARS-CoV-2 virus and/or diagnosis of COVID-19 infection under section 564(b)(1) of the Act, 21 U.S.C. 161WRU-0(A360bbb-3(b) (1), unless the authorization is terminated or revoked sooner. When diagnostic testing is negative, the possibility of a false negative result should be considered in the context of a patient's recent exposures and the presence of clinical signs and symptoms consistent with COVID-19. An individual without symptoms of COVID-19 and who is not shedding SARS-CoV-2 virus  would  expect to have a negative (not detected) result in this assay.   SARS CORONAVIRUS 2 (TAT 6-24 HRS) Nasopharyngeal Nasopharyngeal Swab     Status: None   Collection Time: 05/30/19  3:50 PM   Specimen: Nasopharyngeal Swab  Result Value Ref Range Status   SARS Coronavirus 2 NEGATIVE NEGATIVE Final    Comment: (NOTE) SARS-CoV-2 target nucleic acids are NOT DETECTED. The SARS-CoV-2 RNA is generally detectable in upper and lower respiratory specimens during the acute phase of infection. Negative results do not preclude SARS-CoV-2 infection, do not rule out co-infections with other pathogens, and should not be used as the sole basis for treatment or other patient management decisions. Negative results must be combined with clinical observations, patient history, and epidemiological information. The expected result is Negative. Fact Sheet for Patients: HairSlick.nohttps://www.fda.gov/media/138098/download Fact Sheet for Healthcare Providers: quierodirigir.comhttps://www.fda.gov/media/138095/download This test is not yet approved or cleared by the [place] FDA and  has been authorized for detection and/or diagnosis  of SARS-CoV-2 by FDA under an Emergency Use Authorization (EUA). This EUA will remain  in effect (meaning this test can be used) for the duration of the COVID-19 declaration under Section 56 4(b)(1) of the Act, 21 U.S.C. section 360bbb-3(b)(1), unless the authorization is terminated or revoked sooner. Performed at [facility], [address], [place], [place] [address]   Surgical PCR screen     Status: None   Collection Time: 05/31/19 10:13 PM   Specimen: Nasal Mucosa; Nasal Swab  Result Value Ref Range Status   MRSA, PCR NEGATIVE NEGATIVE Final   Staphylococcus aureus NEGATIVE NEGATIVE Final    Comment: (NOTE) The Xpert SA Assay (FDA approved for NASAL specimens in patients 44 years of age and older), is one component of a comprehensive surveillance program. It is not intended to  diagnose infection nor to guide or monitor treatment. Performed at [facility], [address], [place], [place] [address]      Studies: No results found.  Scheduled Meds: . acetaminophen  1,000 mg Oral Q8H  . metoprolol tartrate  25 mg Oral BID   Continuous Infusions: . sodium chloride    . sodium chloride 75 mL/hr at 06/02/19 1136  . piperacillin-tazobactam (ZOSYN)  IV 3.375 g (06/02/19 1347)      Time spent: 25   Levie Heritage, DO    06/02/2019, 1:59 PM  LOS: 3 days

## 2019-06-02 NOTE — Progress Notes (Signed)
Progress Note  Patient Name: Martin ManchesterLonnelle Rawling Date of Encounter: 06/02/2019  Primary Cardiologist: No primary care provider on file.   Subjective   Doing well POD#1 from lap chole.  Remains in afib with HR in the 100's  Inpatient Medications    Scheduled Meds: . acetaminophen  1,000 mg Oral Q8H   Continuous Infusions: . sodium chloride    . sodium chloride 75 mL/hr at 06/01/19 2240  . diltiazem (CARDIZEM) infusion 15 mg/hr (06/02/19 0205)  . piperacillin-tazobactam (ZOSYN)  IV 3.375 g (06/02/19 0544)   PRN Meds: sodium chloride, morphine injection, ondansetron **OR** ondansetron (ZOFRAN) IV, oxyCODONE   Vital Signs    Vitals:   06/01/19 1557 06/01/19 2231 06/02/19 0044 06/02/19 0512  BP: 122/87 115/77 113/86 (!) 127/97  Pulse: 84 (!) 103 84 86  Resp: 20 18 18 16   Temp: 97.8 F (36.6 C) (!) 97.5 F (36.4 C) 97.9 F (36.6 C) 98.1 F (36.7 C)  TempSrc: Oral Oral Oral Oral  SpO2: 97% 95% 95% 96%  Weight:      Height:        Intake/Output Summary (Last 24 hours) at 06/02/2019 0625 Last data filed at 06/02/2019 0550 Gross per 24 hour  Intake 3044.37 ml  Output 1670 ml  Net 1374.37 ml   Filed Weights   05/30/19 2200 05/31/19 1715 06/01/19 0300  Weight: 122.5 kg 120.2 kg 119.8 kg    Telemetry    Atrial fibrillation with HR in the 110's - Personally Reviewed  ECG    No new EKG to review - Personally Reviewed  Physical Exam   GEN: No acute distress.   Neck: No JVD Cardiac: irregularly irregular, no murmurs, rubs, or gallops.  Respiratory: Clear to auscultation bilaterally. GI: Soft, nontender, non-distended  MS: No edema; No deformity. Neuro:  Nonfocal  Psych: Normal affect   Labs    Chemistry Recent Labs  Lab 05/31/19 0403 06/01/19 0417 06/02/19 0406  NA 140 143 143  K 4.0 3.9 4.2  CL 105 106 107  CO2 22 25 25   GLUCOSE 137* 133* 118*  BUN 69* 52* 36*  CREATININE 2.28* 1.72* 1.62*  CALCIUM 8.5* 8.6* 8.4*  PROT 7.0 7.2 6.8  ALBUMIN  2.8* 2.5* 2.5*  AST 29 39 52*  ALT 34 47* 74*  ALKPHOS 75 89 84  BILITOT 1.5* 1.5* 0.9  GFRNONAA 30* 42* 45*  GFRAA 35* 49* 53*  ANIONGAP 13 12 11      Hematology Recent Labs  Lab 05/31/19 0403 06/01/19 0417 06/02/19 0406  WBC 13.1* 10.7* 10.5  RBC 4.13* 3.95* 4.04*  HGB 12.6* 12.0* 12.1*  HCT 40.4 36.7* 38.2*  MCV 97.8 92.9 94.6  MCH 30.5 30.4 30.0  MCHC 31.2 32.7 31.7  RDW 15.2 14.7 14.8  PLT 270 298 294    Cardiac EnzymesNo results for input(s): TROPONINI in the last 168 hours. No results for input(s): TROPIPOC in the last 168 hours.   BNP Recent Labs  Lab 05/30/19 1600  BNP 75.9     DDimer No results for input(s): DDIMER in the last 168 hours.   Radiology    ECHOCARDIOGRAM COMPLETE  Result Date: 05/31/2019   ECHOCARDIOGRAM REPORT   Patient Name:   Martin ManchesterLONNELLE Mucha Date of Exam: 05/31/2019 Medical Rec #:  [id number]         Height:       73.0 in Accession #:    [id number]        Weight:  270.0 lb Date of Birth:  [birth year]          BSA:          2.44 m Patient Age:    58 years          BP:           20/92 mmHg Patient Gender: M                 HR:           103 bpm. Exam Location:  Inpatient Procedure: 2D Echo Indications:    Atrial Fibrillation 427.31 / I48.91  History:        Patient has no prior history of Echocardiogram examinations.                 Arrythmias:Atrial Fibrillation; Risk Factors:Hypertension,                 Dyslipidemia and Non-Smoker. Acute Renal Failure, LVH.  Sonographer:    Jeryl Columbia Referring Phys: [id number] ARSHAD N KAKRAKANDY IMPRESSIONS  1. Left ventricular ejection fraction, by visual estimation, is 55 to 60%. The left ventricle has normal function. There is mildly increased left ventricular hypertrophy.  2. Left ventricular diastolic function could not be evaluated.  3. The left ventricle has no regional wall motion abnormalities.  4. Global right ventricle has normal systolic function.The right ventricular size is normal. No increase in  right ventricular wall thickness.  5. Left atrial size was normal.  6. Right atrial size was normal.  7. Presence of pericardial fat pad.  8. Mild mitral annular calcification.  9. The mitral valve is degenerative. Trivial mitral valve regurgitation. 10. The tricuspid valve is grossly normal. Tricuspid valve regurgitation is trivial. 11. The aortic valve is tricuspid. Aortic valve regurgitation is not visualized. 12. The pulmonic valve was grossly normal. Pulmonic valve regurgitation is not visualized. 13. Normal pulmonary artery systolic pressure. 14. The inferior vena cava is normal in size with greater than 50% respiratory variability, suggesting right atrial pressure of 3 mmHg. 15. No prior Echocardiogram. FINDINGS  Left Ventricle: Left ventricular ejection fraction, by visual estimation, is 55 to 60%. The left ventricle has normal function. The left ventricle has no regional wall motion abnormalities. There is mildly increased left ventricular hypertrophy. Concentric left ventricular hypertrophy. The left ventricular diastology could not be evaluated due to atrial fibrillation. Left ventricular diastolic function could not be evaluated. Right Ventricle: The right ventricular size is normal. No increase in right ventricular wall thickness. Global RV systolic function is has normal systolic function. The tricuspid regurgitant velocity is 2.36 m/s, and with an assumed right atrial pressure  of 3 mmHg, the estimated right ventricular systolic pressure is normal at 25.3 mmHg. Left Atrium: Left atrial size was normal in size. Right Atrium: Right atrial size was normal in size Pericardium: There is no evidence of pericardial effusion. Presence of pericardial fat pad. Mitral Valve: The mitral valve is degenerative in appearance. Mild mitral annular calcification. Trivial mitral valve regurgitation. Tricuspid Valve: The tricuspid valve is grossly normal. Tricuspid valve regurgitation is trivial. Aortic Valve: The aortic  valve is tricuspid. Aortic valve regurgitation is not visualized. There is moderate calcification of the aortic valve. Pulmonic Valve: The pulmonic valve was grossly normal. Pulmonic valve regurgitation is not visualized. Pulmonic regurgitation is not visualized. Aorta: The aortic root and ascending aorta are structurally normal, with no evidence of dilitation. Venous: The inferior vena cava is normal in size with greater than 50% respiratory  variability, suggesting right atrial pressure of 3 mmHg. IAS/Shunts: No atrial level shunt detected by color flow Doppler.  LEFT VENTRICLE PLAX 2D LVIDd:         5.20 cm  Diastology LVIDs:         3.50 cm  LV e' lateral:   12.10 cm/s LV PW:         1.60 cm  LV E/e' lateral: 4.2 LV IVS:        1.40 cm  LV e' medial:    10.60 cm/s LVOT diam:     2.20 cm  LV E/e' medial:  4.8 LV SV:         79 ml LV SV Index:   30.81 LVOT Area:     3.80 cm  RIGHT VENTRICLE TAPSE (M-mode): 1.9 cm LEFT ATRIUM             Index       RIGHT ATRIUM           Index LA diam:        3.70 cm 1.51 cm/m  RA Area:     11.50 cm LA Vol (A2C):   42.0 ml 17.18 ml/m RA Volume:   24.80 ml  10.15 ml/m LA Vol (A4C):   33.3 ml 13.62 ml/m LA Biplane Vol: 39.8 ml 16.28 ml/m   AORTA Ao Root diam: 3.00 cm MITRAL VALVE                        TRICUSPID VALVE MV Area (PHT): 2.93 cm             TR Peak grad:   22.3 mmHg MV PHT:        75.11 msec           TR Vmax:        236.00 cm/s MV Decel Time: 259 msec MV E velocity: 51.00 cm/s 103 cm/s  SHUNTS MV A velocity: 22.70 cm/s 70.3 cm/s Systemic Diam: 2.20 cm MV E/A ratio:  2.25       1.5  Eleonore Chiquito MD Electronically signed by Eleonore Chiquito MD Signature Date/Time: 05/31/2019/12:12:14 PM    Final     Cardiac Studies   2D echo 05/31/2019 IMPRESSIONS    1. Left ventricular ejection fraction, by visual estimation, is 55 to 60%. The left ventricle has normal function. There is mildly increased left ventricular hypertrophy.  2. Left ventricular diastolic function  could not be evaluated.  3. The left ventricle has no regional wall motion abnormalities.  4. Global right ventricle has normal systolic function.The right ventricular size is normal. No increase in right ventricular wall thickness.  5. Left atrial size was normal.  6. Right atrial size was normal.  7. Presence of pericardial fat pad.  8. Mild mitral annular calcification.  9. The mitral valve is degenerative. Trivial mitral valve regurgitation. 10. The tricuspid valve is grossly normal. Tricuspid valve regurgitation is trivial. 11. The aortic valve is tricuspid. Aortic valve regurgitation is not visualized. 12. The pulmonic valve was grossly normal. Pulmonic valve regurgitation is not visualized. 13. Normal pulmonary artery systolic pressure. 14. The inferior vena cava is normal in size with greater than 50% respiratory variability, suggesting right atrial pressure of 3 mmHg. 15. No prior Echocardiogram.   Patient Profile     58 y.o. male with a hx of HTN, LVH, hypothyroidism and HLD who is being seen for the evaluation of afib RVR and pre-op clearance at the  request of Dr. Eilene Ghazi.   Assessment & Plan    1. Atrial fibrillation at RVR - this is a new dx for him  - likely triggered by acute infection from cholecystitis and perforation - On IV cardizem for rate control. HR in 100-110's. - stop Cardizem IV as he is now taking adequate PO - Restart Lopressor at 25mg  BID and titrate to home dose of 50mg  BID as BP tolerates - CHADSVASC score of 1 for HTN therefore will not need long term anticoagulation - currently off anticoagulation per surgery request - start IV Heparin when ok with surgery - will need to be on anticoagulation with DOAC for 4 weeks and then DCCV and another 4 weeks after that if he does not convert on his own in the hospital  2. HTN - BP stable - losartan, diuretic and BB on hold due to initial soft BP and need for IV Cardizem to control HR - stop IV Cardizem and  restart Lopressor 25mg  BID and titrate up to home dose of 50mg  BID as BP tolerates  3. Pre op clearance - He is very active at baseline. Easily getting > 4 mets of activity. Asymptomatic with afib RVR. Top negative. RCRI is 0.9%. - 2D echo with normal LVF  4. New RBBB - may have been rate dependent - will repeat EKG now that HR slowed - no ischemic evaluation currently planned - could consider outpt nuclear stress test - Hs- troponin normal and 2D echo with normal LVF      For questions or updates, please contact [facility] Please consult [email or web address] for contact info under Cardiology/STEMI.      Signed, , MD  06/02/2019, 6:25 AM

## 2019-06-02 NOTE — TOC Benefit Eligibility Note (Signed)
Transition of Care [facility]) Benefit Eligibility Note    Patient Details  Name: Martin [name] MRN: [id number] Date of Birth: [birth year]   Medication/Dose: ELIQUIS  2.5 MG BID   AND ELIQUIS  5 MG BID  Covered?: Yes  Tier: (NO TIER)  Prescription Coverage Preferred Pharmacy: [facility]  Spoke with Person/Company/Phone Number:: KEVIN @ [facility] [employer] # [phone] [employer]  Co-Pay: $ 47.00   FOR EACH PRESCIPTION  Prior Approval: No  Deductible: Met  Additional Notes: 90 DAY SUPPLY FOR M/O OR RETAIL  $  141.00    Memory Argue Phone Number: 06/02/2019, 10:08 AM

## 2019-06-03 LAB — COMPREHENSIVE METABOLIC PANEL
ALT: 56 U/L — ABNORMAL HIGH (ref 0–44)
AST: 26 U/L (ref 15–41)
Albumin: 2.3 g/dL — ABNORMAL LOW (ref 3.5–5.0)
Alkaline Phosphatase: 73 U/L (ref 38–126)
Anion gap: 10 (ref 5–15)
BUN: 28 mg/dL — ABNORMAL HIGH (ref 6–20)
CO2: 26 mmol/L (ref 22–32)
Calcium: 8.5 mg/dL — ABNORMAL LOW (ref 8.9–10.3)
Chloride: 109 mmol/L (ref 98–111)
Creatinine, Ser: 1.42 mg/dL — ABNORMAL HIGH (ref 0.61–1.24)
GFR calc Af Amer: >60 mL/min (ref >60)
GFR calc non Af Amer: 53 mL/min — ABNORMAL LOW (ref >60)
Glucose, Bld: 121 mg/dL — ABNORMAL HIGH (ref 70–99)
Potassium: 4.2 mmol/L (ref 3.5–5.1)
Sodium: 145 mmol/L (ref 135–145)
Total Bilirubin: 0.8 mg/dL (ref 0.3–1.2)
Total Protein: 6.4 g/dL — ABNORMAL LOW (ref 6.5–8.1)

## 2019-06-03 LAB — CBC
HCT: 36.9 % — ABNORMAL LOW (ref 39.0–52.0)
Hemoglobin: 11.8 g/dL — ABNORMAL LOW (ref 13.0–17.0)
MCH: 30.4 pg (ref 26.0–34.0)
MCHC: 32 g/dL (ref 30.0–36.0)
MCV: 95.1 fL (ref 80.0–100.0)
Platelets: 298 10*3/uL (ref 150–400)
RBC: 3.88 MIL/uL — ABNORMAL LOW (ref 4.22–5.81)
RDW: 15 % (ref 11.5–15.5)
WBC: 9.8 10*3/uL (ref 4.0–10.5)
nRBC: 0 % (ref 0.0–0.2)

## 2019-06-03 MED ORDER — METOPROLOL TARTRATE 50 MG PO TABS
50.0000 mg | ORAL_TABLET | Freq: Two times a day (BID) | ORAL | Status: DC
  Administered 2019-06-03: 50 mg via ORAL
  Filled 2019-06-03: qty 1

## 2019-06-03 MED ORDER — METOPROLOL TARTRATE 25 MG PO TABS
25.0000 mg | ORAL_TABLET | Freq: Once | ORAL | Status: AC
  Administered 2019-06-03: 25 mg via ORAL
  Filled 2019-06-03: qty 1

## 2019-06-03 MED ORDER — METOPROLOL TARTRATE 100 MG PO TABS
100.0000 mg | ORAL_TABLET | Freq: Two times a day (BID) | ORAL | Status: DC
  Administered 2019-06-03 – 2019-06-07 (×8): 100 mg via ORAL
  Filled 2019-06-03 (×8): qty 1

## 2019-06-03 NOTE — Progress Notes (Signed)
[name], male   DOB: 01-Apr-1961, 58 y.o.   MRN: [id number]   [facility] PROGRESS NOTE  [facility] ZCH:[id number] DOB: 1961-06-14 DOA: 05/30/2019 PCP: Ronnald Nian, MD  Brief Summary: HPI: Martin [name] is a 58 y.o. male with history of hypertension and hyperlipidemia presents to the ER with complaints of abdominal pain with nausea vomiting over the last 2 days.  Pain is mostly in the right upper quadrant.  Denies any diarrhea.  Pain is constant irrespective of the diet.  Denies any blood in the vomitus.  Denies any chest pain or shortness of breath.  ED Course: In the ER patient was found to be in A. fib with RVR for which patient was placed on Cardizem infusion after Cardizem bolus.  COVID-19 test was negative.  Labs show acute renal failure with creatinine having worsened from 1 in April 2019 and presently is around 3.75.  Also lab work show leukocytosis of 17.5.  LFTs were normal.  CT abdomen and pelvis shows features concerning for cholecystitis with possible perforation.  On-call general surgeon Dr. Derrell Lolling was consulted who requested to get sonogram of the abdomen which only shows cholecystitis.  Patient is on empiric antibiotics Cardizem infusion and admitted for further management.  Assessment/Plan: Principal Problem:   Abdominal pain Active Problems:   Hypertension   Hypothyroid   Acute cholecystitis   ARF (acute renal failure) (HCC)   Atrial fibrillation with RVR (HCC)   Acute cholecystitis  Postop day #2 from laparoscopic cholecystectomy  Abdomen benign.  No problems with incision  Continue on Zosyn.  Will transition to Augmentin p.o. when cleared by surgery  A. fib with RVR  Remains in A. fib  On metoprolol  Once cleared by surgery, start Eliquis  Acute renal failure  Creatinine improving:  1.42 currently.  We will continue to trend and anticipate that this will continue to improve as his oral hydration  improves  Hypothyroidism  Patient has normal TSH  Hypertension  Transitioned to beta-blocker.  Code Status: Full code Family Communication: None Disposition Plan: Patient should be able to return home   Consultants:  General surgery  Cardiology  Procedures:  Laparoscopic cholecystectomy  Antibiotics:  Zosyn, day 4  HPI/Subjective: Patient doing well.  Appetite is good.  He is tolerating diet.  Abdominal pain is improving.  Has stool  Objective: Vitals:   06/03/19 0442 06/03/19 1234  BP: (!) 147/110 (!) 139/100  Pulse: 85 (!) 59  Resp: 18   Temp: 99.1 F (37.3 C)   SpO2: 100%     Intake/Output Summary (Last 24 hours) at 06/03/2019 1323 Last data filed at 06/03/2019 0830 Gross per 24 hour  Intake 2900.35 ml  Output 1270 ml  Net 1630.35 ml   Filed Weights   06/01/19 0300 06/02/19 0512 06/03/19 0500  Weight: 119.8 kg 121.5 kg 121 kg    Exam:   General: A&O x3.  No acute distress.  Cardiovascular: Irregularly irregular rate, normal S1-S2 sounds.  Respiratory: Clear to auscultation bilaterally with no wheezes, rales, rhonchi  Abdomen: Mild generalized abdominal tenderness.  Incisions clean, dry, intact.  Musculoskeletal: No peripheral edema or leg tenderness.  Extremity: Extremities warm to touch with 2+ dorsalis pedis and radial pulses  Data Reviewed: Basic Metabolic Panel: Recent Labs  Lab 05/30/19 1137 05/31/19 0403 06/01/19 0417 06/02/19 0406 06/03/19 0402  NA 140 140 143 143 145  K 4.1 4.0 3.9 4.2 4.2  CL 98 105 106 107 109  CO2 27 22  25 25 26   GLUCOSE 130* 137* 133* 118* 121*  BUN 56* 69* 52* 36* 28*  CREATININE 3.75* 2.28* 1.72* 1.62* 1.42*  CALCIUM 9.1 8.5* 8.6* 8.4* 8.5*  MG  --   --  2.8*  --   --    Liver Function Tests: Recent Labs  Lab 05/30/19 1137 05/31/19 0403 06/01/19 0417 06/02/19 0406 06/03/19 0402  AST 26 29 39 52* 26  ALT 35 34 47* 74* 56*  ALKPHOS 72 75 89 84 73  BILITOT 1.7* 1.5* 1.5* 0.9 0.8  PROT  7.8 7.0 7.2 6.8 6.4*  ALBUMIN 3.0* 2.8* 2.5* 2.5* 2.3*   Recent Labs  Lab 05/30/19 1137 06/01/19 0417  LIPASE 32 44   No results for input(s): AMMONIA in the last 168 hours. CBC: Recent Labs  Lab 05/30/19 1137 05/31/19 0403 06/01/19 0417 06/02/19 0406 06/03/19 0402  WBC 17.5* 13.1* 10.7* 10.5 9.8  NEUTROABS  --  10.3* 7.8*  --   --   HGB 13.4 12.6* 12.0* 12.1* 11.8*  HCT 41.3 40.4 36.7* 38.2* 36.9*  MCV 94.3 97.8 92.9 94.6 95.1  PLT 276 270 298 294 298   Cardiac Enzymes: No results for input(s): CKTOTAL, CKMB, CKMBINDEX, TROPONINI in the last 168 hours. BNP (last 3 results) Recent Labs    05/30/19 1600  BNP 75.9    ProBNP (last 3 results) No results for input(s): PROBNP in the last 8760 hours.  CBG: Recent Labs  Lab 05/31/19 2130 06/01/19 0603  GLUCAP 136* 120*    Recent Results (from the past 240 hour(s))  Novel Coronavirus, NAA ([facility])     Status: None   Collection Time: 05/29/19 12:00 AM   Specimen: Nasopharyngeal(NP) swabs in vial transport medium   NASOPHARYNGE  TESTING  Result Value Ref Range Status   SARS-CoV-2, NAA Not Detected Not Detected Final    Comment: This nucleic acid amplification test was developed and its performance characteristics determined by [employer]. Nucleic acid amplification tests include PCR and TMA. This test has not been FDA cleared or approved. This test has been authorized by FDA under an Emergency Use Authorization (EUA). This test is only authorized for the duration of time the declaration that circumstances exist justifying the authorization of the emergency use of in vitro diagnostic tests for detection of SARS-CoV-2 virus and/or diagnosis of COVID-19 infection under section 564(b)(1) of the Act, 21 U.S.C. 315QMG-8(Q) (1), unless the authorization is terminated or revoked sooner. When diagnostic testing is negative, the possibility of a false negative result should be considered in the context of a  patient's recent exposures and the presence of clinical signs and symptoms consistent with COVID-19. An individual without symptoms of COVID-19 and who is not shedding SARS-CoV-2 virus would  expect to have a negative (not detected) result in this assay.   SARS CORONAVIRUS 2 (TAT 6-24 HRS) Nasopharyngeal Nasopharyngeal Swab     Status: None   Collection Time: 05/30/19  3:50 PM   Specimen: Nasopharyngeal Swab  Result Value Ref Range Status   SARS Coronavirus 2 NEGATIVE NEGATIVE Final    Comment: (NOTE) SARS-CoV-2 target nucleic acids are NOT DETECTED. The SARS-CoV-2 RNA is generally detectable in upper and lower respiratory specimens during the acute phase of infection. Negative results do not preclude SARS-CoV-2 infection, do not rule out co-infections with other pathogens, and should not be used as the sole basis for treatment or other patient management decisions. Negative results must be combined with clinical observations, patient history, and epidemiological information. The  expected result is Negative. Fact Sheet for Patients: HairSlick.nohttps://www.fda.gov/media/138098/download Fact Sheet for Healthcare Providers: quierodirigir.comhttps://www.fda.gov/media/138095/download This test is not yet approved or cleared by the [place] FDA and  has been authorized for detection and/or diagnosis of SARS-CoV-2 by FDA under an Emergency Use Authorization (EUA). This EUA will remain  in effect (meaning this test can be used) for the duration of the COVID-19 declaration under Section 56 4(b)(1) of the Act, 21 U.S.C. section 360bbb-3(b)(1), unless the authorization is terminated or revoked sooner. Performed at [facility], [address] [place], [place] [address]   Surgical PCR screen     Status: None   Collection Time: 05/31/19 10:13 PM   Specimen: Nasal Mucosa; Nasal Swab  Result Value Ref Range Status   MRSA, PCR NEGATIVE NEGATIVE Final   Staphylococcus aureus NEGATIVE NEGATIVE Final     Comment: (NOTE) The Xpert SA Assay (FDA approved for NASAL specimens in patients 60 years of age and older), is one component of a comprehensive surveillance program. It is not intended to diagnose infection nor to guide or monitor treatment. Performed at [facility], [address] [place], [place] [address]      Studies: No results found.  Scheduled Meds: . acetaminophen  1,000 mg Oral Q8H  . metoprolol tartrate  100 mg Oral BID   Continuous Infusions: . sodium chloride    . sodium chloride 75 mL/hr at 06/03/19 0600  . piperacillin-tazobactam (ZOSYN)  IV Stopped (06/03/19 0555)      Time spent: 25   Levie HeritageJacob J Matis Monnier, DO    06/03/2019, 1:23 PM  LOS: 4 days

## 2019-06-03 NOTE — Progress Notes (Signed)
Patient has yellow MEWS score of 3 due to heart rate. This change is not acute; he trends high prior to administration of metoprolol. Gave 100mg  metoprolol and will obtain a new set of vitals at 2200.

## 2019-06-03 NOTE — Progress Notes (Signed)
2 Days Post-Op    CC: Abdominal pain  Subjective: Patient is alert and appears fairly comfortable.  He has a large abdomen but says it appears normal to him.  Port sites all look good.  He is tolerating a heart healthy diet.  JP drain shows just clotted bloody drainage, there is a small amount of serous fluid in it also.  Objective: Vital signs in last 24 hours: Temp:  [97.9 F (36.6 C)-99.3 F (37.4 C)] 99.1 F (37.3 C) (12/12 0442) Pulse Rate:  [62-132] 85 (12/12 0442) Resp:  [18-23] 18 (12/12 0442) BP: (116-147)/(82-110) 147/110 (12/12 0442) SpO2:  [95 %-100 %] 100 % (12/12 0442) Weight:  [121 kg] 121 kg (12/12 0500) Last BM Date: 06/02/19 7:20 PM 2400 IV 1200 urine Drain 70 BM x1 Afebrile tachycardic heart rates up in the 130s-140s Creatinine improving;1.72>>1.62>> 1.42 Total bilirubin: 1.5>> 0.9>> 0.8 WBC 9.8 H/H: 12.0/36.7>> 12.1/38.2>> 11.8/36.9  Intake/Output from previous day: 12/11 0701 - 12/12 0700 In: 3140.4 [P.O.:720; I.V.:2274.7; IV Piggyback:145.6] Out: 1270 [Urine:1200; Drains:70] Intake/Output this shift: No intake/output data recorded.  General appearance: alert, cooperative and no distress Resp: clear to auscultation bilaterally GI: Soft, sore, port sites all look good.  JP drainage as noted above; mostly old blood and a little bit of serous fluid.  Tolerating soft diet.  Lab Results:  Recent Labs    06/02/19 0406 06/03/19 0402  WBC 10.5 9.8  HGB 12.1* 11.8*  HCT 38.2* 36.9*  PLT 294 298    BMET Recent Labs    06/02/19 0406 06/03/19 0402  NA 143 145  K 4.2 4.2  CL 107 109  CO2 25 26  GLUCOSE 118* 121*  BUN 36* 28*  CREATININE 1.62* 1.42*  CALCIUM 8.4* 8.5*   PT/INR No results for input(s): LABPROT, INR in the last 72 hours.  Recent Labs  Lab 05/30/19 1137 05/31/19 0403 06/01/19 0417 06/02/19 0406 06/03/19 0402  AST 26 29 39 52* 26  ALT 35 34 47* 74* 56*  ALKPHOS 72 75 89 84 73  BILITOT 1.7* 1.5* 1.5* 0.9 0.8  PROT 7.8  7.0 7.2 6.8 6.4*  ALBUMIN 3.0* 2.8* 2.5* 2.5* 2.3*     Lipase     Component Value Date/Time   LIPASE 44 06/01/2019 0417     Medications: . acetaminophen  1,000 mg Oral Q8H  . metoprolol tartrate  50 mg Oral BID    Assessment/Plan LVH CKD  Hypertension Hypothyroid Hyperlipidemia Atrial fibrillation with RVR- new *above per medicine -will need anticoagulation  Acute cholecystitis - S/P laparoscopic partial cholecystectomy, Dr. Georgette Dover, 12/10 - JP drain is sanguinous, continue, will be discharged with drain - pain improved, he is tolerating a diet - recheck labs in am  FEN: HH diet ID: Zosyn 12/8>> day 4 DVT: SCDs Follow-up: [facility]  Plan: Patient will require anticoagulation for his atrial fib.  Will check with Dr. Donne Hazel on when this can be started.  He has follow-up in the AVS with drain removal/evaluation next week and then follow-up with Dr. Georgette Dover.  Discharge instructions are also in place.      LOS: 4 days    Ellamarie Naeve 06/03/2019 Please see Amion

## 2019-06-03 NOTE — Progress Notes (Signed)
Pt remains in A fib with HR now sustaining in the 120s-130s. MD paged. Verbal orders received for additional Metoprolol 25 mg PO x1 now, and then to increase BID dose to 50 mg PO starting in the morning. Will continue to monitor.

## 2019-06-03 NOTE — Progress Notes (Signed)
Cardiology Progress Note  Patient IDGloris [name]: Martin [name] MRN: [id number] DOB: 06/13/1961 Date of Encounter: 06/03/2019  Primary Cardiologist: No primary care provider on file.  Subjective  Remains in atrial fibrillation.  Has had RVR.  No complaints.  No palpitations no chest pain no trouble breathing.  ROS:  All other ROS reviewed and negative. Pertinent positives noted in the HPI.     Inpatient Medications  Scheduled Meds: . acetaminophen  1,000 mg Oral Q8H  . metoprolol tartrate  50 mg Oral BID   Continuous Infusions: . sodium chloride    . sodium chloride 75 mL/hr at 06/03/19 0600  . piperacillin-tazobactam (ZOSYN)  IV Stopped (06/03/19 0555)   PRN Meds: sodium chloride, morphine injection, ondansetron **OR** ondansetron (ZOFRAN) IV, oxyCODONE   Vital Signs   Vitals:   06/02/19 1943 06/03/19 0023 06/03/19 0442 06/03/19 0500  BP: (!) 127/97 (!) 138/103 (!) 147/110   Pulse: (!) 132  85   Resp: 20 18 18    Temp: 98.8 F (37.1 C) 98.3 F (36.8 C) 99.1 F (37.3 C)   TempSrc: Oral Oral Oral   SpO2: 97% 98% 100%   Weight:    121 kg  Height:        Intake/Output Summary (Last 24 hours) at 06/03/2019 1101 Last data filed at 06/03/2019 0830 Gross per 24 hour  Intake 3020.35 ml  Output 1270 ml  Net 1750.35 ml   Last 3 Weights 06/03/2019 06/02/2019 06/01/2019  Weight (lbs) 266 lb 12.1 oz 267 lb 12.8 oz 264 lb 3.2 oz  Weight (kg) 121 kg 121.473 kg 119.84 kg      Telemetry  Overnight telemetry shows atrial fibrillation RVR with rates up in the 140s.  He has a rate related right bundle branch block with heart rate gets above 130., which I personally reviewed.   ECG  The most recent ECG shows atrial fibrillation with RVR heart rate 125, which I personally reviewed.   Physical Exam   Vitals:   06/02/19 1943 06/03/19 0023 06/03/19 0442 06/03/19 0500  BP: (!) 127/97 (!) 138/103 (!) 147/110   Pulse: (!) 132  85   Resp: 20 18 18    Temp: 98.8 F (37.1 C) 98.3 F (36.8  C) 99.1 F (37.3 C)   TempSrc: Oral Oral Oral   SpO2: 97% 98% 100%   Weight:    121 kg  Height:         Intake/Output Summary (Last 24 hours) at 06/03/2019 1101 Last data filed at 06/03/2019 0830 Gross per 24 hour  Intake 3020.35 ml  Output 1270 ml  Net 1750.35 ml    Last 3 Weights 06/03/2019 06/02/2019 06/01/2019  Weight (lbs) 266 lb 12.1 oz 267 lb 12.8 oz 264 lb 3.2 oz  Weight (kg) 121 kg 121.473 kg 119.84 kg    Body mass index is 34.72 kg/m.  General: Well nourished, well developed, in no acute distress Head: Atraumatic, normal size  Eyes: PEERLA, EOMI  Neck: Supple, no JVD Endocrine: No thryomegaly Cardiac: Irregular rhythm, tachycardia  Lungs: Clear to auscultation bilaterally, no wheezing, rhonchi or rales  Abd: Distended abdomen, JP drain in place Ext: No edema, pulses 2+ Musculoskeletal: No deformities, BUE and BLE strength normal and equal Skin: Warm and dry, no rashes   Neuro: Alert and oriented to person, place, time, and situation, CNII-XII grossly intact, no focal deficits  Psych: Normal mood and affect   Labs  High Sensitivity Troponin:   Recent Labs  Lab 05/31/19 0727 05/31/19 1314  TROPONINIHS 8 10     Cardiac EnzymesNo results for input(s): TROPONINI in the last 168 hours. No results for input(s): TROPIPOC in the last 168 hours.  Chemistry Recent Labs  Lab 06/01/19 0417 06/02/19 0406 06/03/19 0402  NA 143 143 145  K 3.9 4.2 4.2  CL 106 107 109  CO2 25 25 26   GLUCOSE 133* 118* 121*  BUN 52* 36* 28*  CREATININE 1.72* 1.62* 1.42*  CALCIUM 8.6* 8.4* 8.5*  PROT 7.2 6.8 6.4*  ALBUMIN 2.5* 2.5* 2.3*  AST 39 52* 26  ALT 47* 74* 56*  ALKPHOS 89 84 73  BILITOT 1.5* 0.9 0.8  GFRNONAA 42* 45* 53*  GFRAA 49* 53* >60  ANIONGAP 12 11 10     Hematology Recent Labs  Lab 06/01/19 0417 06/02/19 0406 06/03/19 0402  WBC 10.7* 10.5 9.8  RBC 3.95* 4.04* 3.88*  HGB 12.0* 12.1* 11.8*  HCT 36.7* 38.2* 36.9*  MCV 92.9 94.6 95.1  MCH 30.4 30.0 30.4    MCHC 32.7 31.7 32.0  RDW 14.7 14.8 15.0  PLT 298 294 298   BNP Recent Labs  Lab 05/30/19 1600  BNP 75.9    DDimer No results for input(s): DDIMER in the last 168 hours.   Radiology  No results found.  Cardiac Studies  TTE 05/31/2019  1. Left ventricular ejection fraction, by visual estimation, is 55 to 60%. The left ventricle has normal function. There is mildly increased left ventricular hypertrophy.  2. Left ventricular diastolic function could not be evaluated.  3. The left ventricle has no regional wall motion abnormalities.  4. Global right ventricle has normal systolic function.The right ventricular size is normal. No increase in right ventricular wall thickness.  5. Left atrial size was normal.  6. Right atrial size was normal.  7. Presence of pericardial fat pad.  8. Mild mitral annular calcification.  9. The mitral valve is degenerative. Trivial mitral valve regurgitation. 10. The tricuspid valve is grossly normal. Tricuspid valve regurgitation is trivial. 11. The aortic valve is tricuspid. Aortic valve regurgitation is not visualized. 12. The pulmonic valve was grossly normal. Pulmonic valve regurgitation is not visualized. 13. Normal pulmonary artery systolic pressure. 14. The inferior vena cava is normal in size with greater than 50% respiratory variability, suggesting right atrial pressure of 3 mmHg. 15. No prior Echocardiogram.  Patient Profile  Martin [name] is a 58 y.o. male with hypertension, hypothyroidism who was admitted for acute cholecystitis and atrial fibrillation with RVR.  He underwent lap cholecystectomy on 12/10.  He still has a JP drain in place.  Remains atrial fibrillation.  Assessment & Plan  1.  New onset atrial fibrillation with RVR: CHA2DS2-VASc = 1.  This is all related to critical illness in the setting of cholecystitis and is now status post cholecystectomy.  His left atrial size is normal and he has no strong stroke risk factors.  I  think it is best we proceed with the plan delineated by cardiology yesterday.  I would recommend to start anticoagulation when able.  At this point we would prefer DOAC.  If surgery is okay with this, would recommend to start Eliquis 5 mg twice daily.  He will need anticoagulation 3 weeks before cardioversion.  Since his JP drain will remain in place for the next week or so I think there is no point to proceed with TEE cardioversion this admission.  We can arrange for him to see [place] in 2 weeks once he leaves the hospital and we  can set him up for cardioversion at that time.  Again it will be prudent that he remains on anticoagulation and that is started safely.  Please start this once surgery has cleared this.  For now, would continue metoprolol tartrate for rate control.  I have increased him to 100 mg twice a day.  I do not suspect he will leave today as he still has IV antibiotics going.  Please discuss with [place] the plan for when he will go home as we would like to make final recommendations and set him up with a follow-up appointment. 2.  Right bundle branch block, rate dependent: When his heart rate gets above 130 he does have a right bundle branch block.  Echocardiogram looks okay.  No need for stress testing or ischemia evaluation is admission.  For questions or updates, please contact [facility] Please consult [email or web address] for contact info under   Signed, Gerri Spore T. Flora Lipps, MD [place]  [facility]  06/03/2019 11:01 AM

## 2019-06-04 LAB — HEPARIN LEVEL (UNFRACTIONATED): Heparin Unfractionated: 0.22 IU/mL — ABNORMAL LOW (ref 0.30–0.70)

## 2019-06-04 LAB — COMPREHENSIVE METABOLIC PANEL
ALT: 51 U/L — ABNORMAL HIGH (ref 0–44)
AST: 24 U/L (ref 15–41)
Albumin: 2.2 g/dL — ABNORMAL LOW (ref 3.5–5.0)
Alkaline Phosphatase: 79 U/L (ref 38–126)
Anion gap: 7 (ref 5–15)
BUN: 25 mg/dL — ABNORMAL HIGH (ref 6–20)
CO2: 26 mmol/L (ref 22–32)
Calcium: 8.4 mg/dL — ABNORMAL LOW (ref 8.9–10.3)
Chloride: 111 mmol/L (ref 98–111)
Creatinine, Ser: 1.47 mg/dL — ABNORMAL HIGH (ref 0.61–1.24)
GFR calc Af Amer: 59 mL/min — ABNORMAL LOW (ref >60)
GFR calc non Af Amer: 51 mL/min — ABNORMAL LOW (ref >60)
Glucose, Bld: 112 mg/dL — ABNORMAL HIGH (ref 70–99)
Potassium: 4.3 mmol/L (ref 3.5–5.1)
Sodium: 144 mmol/L (ref 135–145)
Total Bilirubin: 0.7 mg/dL (ref 0.3–1.2)
Total Protein: 6.2 g/dL — ABNORMAL LOW (ref 6.5–8.1)

## 2019-06-04 LAB — CBC
HCT: 34.5 % — ABNORMAL LOW (ref 39.0–52.0)
Hemoglobin: 11.1 g/dL — ABNORMAL LOW (ref 13.0–17.0)
MCH: 30.5 pg (ref 26.0–34.0)
MCHC: 32.2 g/dL (ref 30.0–36.0)
MCV: 94.8 fL (ref 80.0–100.0)
Platelets: 328 10*3/uL (ref 150–400)
RBC: 3.64 MIL/uL — ABNORMAL LOW (ref 4.22–5.81)
RDW: 14.9 % (ref 11.5–15.5)
WBC: 10.3 10*3/uL (ref 4.0–10.5)
nRBC: 0 % (ref 0.0–0.2)

## 2019-06-04 MED ORDER — DILTIAZEM HCL 30 MG PO TABS
30.0000 mg | ORAL_TABLET | Freq: Four times a day (QID) | ORAL | Status: DC
  Administered 2019-06-04 – 2019-06-06 (×8): 30 mg via ORAL
  Filled 2019-06-04 (×8): qty 1

## 2019-06-04 MED ORDER — HEPARIN (PORCINE) 25000 UT/250ML-% IV SOLN
1700.0000 [IU]/h | INTRAVENOUS | Status: DC
  Administered 2019-06-04: 1500 [IU]/h via INTRAVENOUS
  Filled 2019-06-04 (×2): qty 250

## 2019-06-04 NOTE — Progress Notes (Signed)
ANTICOAGULATION CONSULT NOTE  Pharmacy Consult:  Heparin Indication: atrial fibrillation  Allergies  Allergen Reactions  . Garlic Swelling  . Shrimp [Shellfish Allergy] Swelling    Patient Measurements: Height: 6' 1.5" (186.7 cm) Weight: 269 lb 14.4 oz (122.4 kg) IBW/kg (Calculated) : 81.05 Heparin Dosing Weight: 107 kg  Vital Signs: Temp: 98 F (36.7 C) (12/13 1124) Temp Source: Oral (12/13 1124) BP: 160/107 (12/13 1853) Pulse Rate: 90 (12/13 1853)  Labs: Recent Labs    06/02/19 0406 06/03/19 0402 06/04/19 0444 06/04/19 1641  HGB 12.1* 11.8* 11.1*  --   HCT 38.2* 36.9* 34.5*  --   PLT 294 298 328  --   HEPARINUNFRC  --   --   --  0.22*  CREATININE 1.62* 1.42* 1.47*  --     Estimated Creatinine Clearance: 73.8 mL/min (A) (by C-G formula based on SCr of 1.47 mg/dL (H)).   Assessment: 12 yom with new onset atrial fibrillation s/p lap chole on 12/10. Not on anticoagulation PTA. Per cardiology, awaiting surgical clearance to start Eliquis, pharmacy to dose heparin in the meantime.   Heparin level is sub-therapeutic; however, it was a 4-hr heparin level.  No issue with heparin infusion nor bleeding per RN.  Goal of Therapy:  Heparin level 0.3-0.7 units/ml Monitor platelets by anticoagulation protocol: Yes   Plan:  Increase heparin gtt to 1700 units/hr Check 6 hr heparin level  Zhania Shaheen D. Mina Marble, PharmD, BCPS, [place] 06/04/2019, 7:45 PM

## 2019-06-04 NOTE — Progress Notes (Signed)
ANTICOAGULATION CONSULT NOTE - Initial Consult  Pharmacy Consult for heparin Indication: atrial fibrillation  Allergies  Allergen Reactions  . Garlic Swelling  . Shrimp [Shellfish Allergy] Swelling    Patient Measurements: Height: 6' 1.5" (186.7 cm) Weight: 269 lb 14.4 oz (122.4 kg) IBW/kg (Calculated) : 81.05 Heparin Dosing Weight: 107 kg  Vital Signs: Temp: 97.9 F (36.6 C) (12/13 0727) Temp Source: Oral (12/13 0727) BP: 150/112 (12/13 0727) Pulse Rate: 140 (12/13 0848)  Labs: Recent Labs    06/02/19 0406 06/03/19 0402 06/04/19 0444  HGB 12.1* 11.8* 11.1*  HCT 38.2* 36.9* 34.5*  PLT 294 298 328  CREATININE 1.62* 1.42* 1.47*    Estimated Creatinine Clearance: 73.8 mL/min (A) (by C-G formula based on SCr of 1.47 mg/dL (H)).   Medical History: Past Medical History:  Diagnosis Date  . Cardiomegaly    LVH  . Cataract   . Hemorrhoids   . Hyperlipidemia   . Hypertension   . Hypothyroidism   . Lower GI bleeding     Medications:  Scheduled:  . acetaminophen  1,000 mg Oral Q8H  . diltiazem  30 mg Oral Q6H  . metoprolol tartrate  100 mg Oral BID    Assessment: 47 yom with new onset atrial fibrillation s/p lap chole on 12/10. Not on anticoagulation PTA. Per cardiology, awaiting surgical clearance to start Eliquis, pharmacy to dose heparin in the meantime. H&H has been slowly decreasing today it is 11.1/34.5 (likely decreasing d/t surgery), platelets are wnl at 328.  Goal of Therapy:  Heparin level 0.3-0.7 units/ml Monitor platelets by anticoagulation protocol: Yes   Plan:  NO bolus  Start heparin infusion at 1500 units/hr Check anti-Xa level in 6 hours and daily while on heparin Continue to monitor H&H and platelets  F/u transition to Eliquis    Thank you,   Eddie Candle, PharmD PGY-1 Pharmacy Resident   Please check amion for clinical pharmacist contact number   06/04/2019,10:13 AM

## 2019-06-04 NOTE — Progress Notes (Addendum)
3 Days Post-Op    CC: Abdominal pain  Subjective: Drainage from the JP is serosanguinous. He is tolerating his diet well. Port sites look fine.    Objective: Vital signs in last 24 hours: Temp:  [97.5 F (36.4 C)-99.1 F (37.3 C)] 97.9 F (36.6 C) (12/13 0727) Pulse Rate:  [59-135] 68 (12/13 0727) Resp:  [18-20] 20 (12/13 0727) BP: (132-166)/(88-123) 150/112 (12/13 0727) SpO2:  [95 %-98 %] 95 % (12/13 0727) Weight:  [122.4 kg] 122.4 kg (12/13 0522) Last BM Date: 06/03/19 1320 p.o 850 urine BM x2 Nothing recorded from the drain Afebrile heart rate still poorly controlled rates 112-172 BP also elevated 150/112 this AM.  Creatinine stable 1.47  Total bilirubin: 1.5>> 0.9>> 0.8>>0.7 H/H 11.1/34.5   Intake/Output from previous day: 12/12 0701 - 12/13 0700 In: 1320 [P.O.:1320] Out: 852 [Urine:850; Stool:2] Intake/Output this shift: No intake/output data recorded.  General appearance: alert, cooperative and no distress Resp: clear to auscultation bilaterally GI: soft, large abdomen, sites look fine, the drain has serosanguinous fluid.  Tolerating diet and having BM's.    Lab Results:  Recent Labs    06/03/19 0402 06/04/19 0444  WBC 9.8 10.3  HGB 11.8* 11.1*  HCT 36.9* 34.5*  PLT 298 328    BMET Recent Labs    06/03/19 0402 06/04/19 0444  NA 145 144  K 4.2 4.3  CL 109 111  CO2 26 26  GLUCOSE 121* 112*  BUN 28* 25*  CREATININE 1.42* 1.47*  CALCIUM 8.5* 8.4*   PT/INR No results for input(s): LABPROT, INR in the last 72 hours.  Recent Labs  Lab 05/31/19 0403 06/01/19 0417 06/02/19 0406 06/03/19 0402 06/04/19 0444  AST 29 39 52* 26 24  ALT 34 47* 74* 56* 51*  ALKPHOS 75 89 84 73 79  BILITOT 1.5* 1.5* 0.9 0.8 0.7  PROT 7.0 7.2 6.8 6.4* 6.2*  ALBUMIN 2.8* 2.5* 2.5* 2.3* 2.2*     Lipase     Component Value Date/Time   LIPASE 44 06/01/2019 0417     Medications: . acetaminophen  1,000 mg Oral Q8H  . metoprolol tartrate  100 mg Oral BID     Assessment/Plan LVH CKD: Creatinine 1.72>>1.62>> 1.42>>1.47 Hypertension Hypothyroid Hyperlipidemia Atrial fibrillation with RVR- new  - above per medicine -will need anticoagulation  - H/H: 12.0/36.7>> 12.1/38.2>> 11.8/36.9  Acute cholecystitis - S/P laparoscopic partial cholecystectomy, Dr. Georgette Dover, 12/10 POD# 3 - JP drain is sanguinous, continue, will be discharged with drain - pain improved, he is tolerating a diet - recheck labs in am  [place] diet ID: Zosyn 12/8>> day 5 DVT: SCDs Follow-up: [facility]  PLan:  From our standpoint you can start heparin; I would do it with no bolus, and see how H/H does on anticoagulation before converting to oral anticoagulation.  He is still in the hospital mostly because of his AF.         LOS: 5 days    Cambell Rickenbach 06/04/2019 Please see Amion

## 2019-06-04 NOTE — Progress Notes (Signed)
Cardiology Progress Note  Patient IDGloris [name]: Martin [name] MRN: [id number] DOB: 10/29/1960 Date of Encounter: 06/04/2019  Primary Cardiologist: No primary care provider on file.  Subjective  Remains in A. fib, with poorly controlled heart rates.  Awaiting surgical clearance to start Eliquis.  ROS:  All other ROS reviewed and negative. Pertinent positives noted in the HPI.     Inpatient Medications  Scheduled Meds: . acetaminophen  1,000 mg Oral Q8H  . diltiazem  30 mg Oral Q6H  . metoprolol tartrate  100 mg Oral BID   Continuous Infusions: . sodium chloride    . sodium chloride 75 mL/hr at 06/03/19 2243  . piperacillin-tazobactam (ZOSYN)  IV 3.375 g (06/04/19 0620)   PRN Meds: sodium chloride, morphine injection, ondansetron **OR** ondansetron (ZOFRAN) IV, oxyCODONE   Vital Signs   Vitals:   06/03/19 2108 06/03/19 2224 06/04/19 0522 06/04/19 0727  BP: (!) 166/123 (!) 132/98 (!) 166/107 (!) 150/112  Pulse: (!) 135 95 78 68  Resp: 18  18 20   Temp: 99.1 F (37.3 C)  98.6 F (37 C) 97.9 F (36.6 C)  TempSrc: Oral  Oral Oral  SpO2: 98%  96% 95%  Weight:   122.4 kg   Height:        Intake/Output Summary (Last 24 hours) at 06/04/2019 0826 Last data filed at 06/04/2019 0617 Gross per 24 hour  Intake 1320 ml  Output 852 ml  Net 468 ml   Last 3 Weights 06/04/2019 06/03/2019 06/02/2019  Weight (lbs) 269 lb 14.4 oz 266 lb 12.1 oz 267 lb 12.8 oz  Weight (kg) 122.426 kg 121 kg 121.473 kg      Telemetry  Overnight telemetry shows atrial fibrillation RVR rates in the 120-130 range.  He has a rate related right bundle branch block with heart rate gets above 130., which I personally reviewed.   ECG  The most recent ECG shows atrial fibrillation with RVR heart rate 128, which I personally reviewed.   Physical Exam   Vitals:   06/03/19 2108 06/03/19 2224 06/04/19 0522 06/04/19 0727  BP: (!) 166/123 (!) 132/98 (!) 166/107 (!) 150/112  Pulse: (!) 135 95 78 68  Resp: 18  18  20   Temp: 99.1 F (37.3 C)  98.6 F (37 C) 97.9 F (36.6 C)  TempSrc: Oral  Oral Oral  SpO2: 98%  96% 95%  Weight:   122.4 kg   Height:         Intake/Output Summary (Last 24 hours) at 06/04/2019 0826 Last data filed at 06/04/2019 0617 Gross per 24 hour  Intake 1320 ml  Output 852 ml  Net 468 ml    Last 3 Weights 06/04/2019 06/03/2019 06/02/2019  Weight (lbs) 269 lb 14.4 oz 266 lb 12.1 oz 267 lb 12.8 oz  Weight (kg) 122.426 kg 121 kg 121.473 kg    Body mass index is 35.13 kg/m.  General: Well nourished, well developed, in no acute distress Head: Atraumatic, normal size  Eyes: PEERLA, EOMI  Neck: Supple, no JVD Endocrine: No thryomegaly Cardiac: Irregular rhythm, tachycardia  Lungs: Clear to auscultation bilaterally, no wheezing, rhonchi or rales  Abd: Distended abdomen, JP drain in place Ext: No edema, pulses 2+ Musculoskeletal: No deformities, BUE and BLE strength normal and equal Skin: Warm and dry, no rashes   Neuro: Alert and oriented to person, place, time, and situation, CNII-XII grossly intact, no focal deficits  Psych: Normal mood and affect   Labs  High Sensitivity Troponin:   Recent Labs  Lab 05/31/19 0727 05/31/19 1314  TROPONINIHS 8 10     Cardiac EnzymesNo results for input(s): TROPONINI in the last 168 hours. No results for input(s): TROPIPOC in the last 168 hours.  Chemistry Recent Labs  Lab 06/02/19 0406 06/03/19 0402 06/04/19 0444  NA 143 145 144  K 4.2 4.2 4.3  CL 107 109 111  CO2 25 26 26   GLUCOSE 118* 121* 112*  BUN 36* 28* 25*  CREATININE 1.62* 1.42* 1.47*  CALCIUM 8.4* 8.5* 8.4*  PROT 6.8 6.4* 6.2*  ALBUMIN 2.5* 2.3* 2.2*  AST 52* 26 24  ALT 74* 56* 51*  ALKPHOS 84 73 79  BILITOT 0.9 0.8 0.7  GFRNONAA 45* 53* 51*  GFRAA 53* >60 59*  ANIONGAP 11 10 7     Hematology Recent Labs  Lab 06/02/19 0406 06/03/19 0402 06/04/19 0444  WBC 10.5 9.8 10.3  RBC 4.04* 3.88* 3.64*  HGB 12.1* 11.8* 11.1*  HCT 38.2* 36.9* 34.5*  MCV  94.6 95.1 94.8  MCH 30.0 30.4 30.5  MCHC 31.7 32.0 32.2  RDW 14.8 15.0 14.9  PLT 294 298 328   BNP Recent Labs  Lab 05/30/19 1600  BNP 75.9    DDimer No results for input(s): DDIMER in the last 168 hours.   Radiology  No results found.  Cardiac Studies  TTE 05/31/2019  1. Left ventricular ejection fraction, by visual estimation, is 55 to 60%. The left ventricle has normal function. There is mildly increased left ventricular hypertrophy.  2. Left ventricular diastolic function could not be evaluated.  3. The left ventricle has no regional wall motion abnormalities.  4. Global right ventricle has normal systolic function.The right ventricular size is normal. No increase in right ventricular wall thickness.  5. Left atrial size was normal.  6. Right atrial size was normal.  7. Presence of pericardial fat pad.  8. Mild mitral annular calcification.  9. The mitral valve is degenerative. Trivial mitral valve regurgitation. 10. The tricuspid valve is grossly normal. Tricuspid valve regurgitation is trivial. 11. The aortic valve is tricuspid. Aortic valve regurgitation is not visualized. 12. The pulmonic valve was grossly normal. Pulmonic valve regurgitation is not visualized. 13. Normal pulmonary artery systolic pressure. 14. The inferior vena cava is normal in size with greater than 50% respiratory variability, suggesting right atrial pressure of 3 mmHg. 15. No prior Echocardiogram.  Patient Profile  Martin [name] is a 58 y.o. male with hypertension, hypothyroidism who was admitted for acute cholecystitis and atrial fibrillation with RVR.  He underwent lap cholecystectomy on 12/10.  He still has a JP drain in place.  Remains atrial fibrillation.  Assessment & Plan   1.  New onset atrial fibrillation with RVR:  -CHA2DS2-VASc = 1.  -Related to acute cholecystitis and now postoperative state -Normal left atrial size and normal left ventricular function -We will continue  metoprolol 100 mg twice daily -We will also add diltiazem short acting 30 mg every 6 hours for better rate control -We were hoping to effectively rate control him and have him on Eliquis for 3 weeks and then consider him for a cardioversion to give him time to heal up from his surgery.  Given his difficult to control rates, we may need to proceed with a TEE cardioversion prior to discharge.  We are waiting for surgical clearance start Eliquis.  Once this is done would prefer 2-3 doses and then we could consider a TEE cardioversion.  We will discuss this with the hospital medicine team.  2.  Right bundle branch block, rate dependent:  -He has a rate related right bundle branch block when his heart rate is significantly elevated.  Echocardiogram with normal LV function.  No further work-up needed.    For questions or updates, please contact [facility] Please consult [email or web address] for contact info under   Signed, Gerri Spore T. Flora Lipps, MD [facility]  [facility]  06/04/2019 8:26 AM

## 2019-06-04 NOTE — Progress Notes (Signed)
[name], male   DOB: [birth year], 58 y.o.   MRN: [id number]   [facility] PROGRESS NOTE  [facility] FGB:[id number] DOB: Apr 23, 1961 DOA: 05/30/2019 PCP: [name], MD  Brief Summary: HPI: Martin [name] is a 58 y.o. male with history of hypertension and hyperlipidemia presents to the ER with complaints of abdominal pain with nausea vomiting over the last 2 days.  Pain is mostly in the right upper quadrant.  Denies any diarrhea.  Pain is constant irrespective of the diet.  Denies any blood in the vomitus.  Denies any chest pain or shortness of breath.  ED Course: In the ER patient was found to be in A. fib with RVR for which patient was placed on Cardizem infusion after Cardizem bolus.  COVID-19 test was negative.  Labs show acute renal failure with creatinine having worsened from 1 in April 2019 and presently is around 3.75.  Also lab work show leukocytosis of 17.5.  LFTs were normal.  CT abdomen and pelvis shows features concerning for cholecystitis with possible perforation.  On-call general surgeon Dr. Rosendo Gros was consulted who requested to get sonogram of the abdomen which only shows cholecystitis.  Patient is on empiric antibiotics Cardizem infusion and admitted for further management.  Assessment/Plan: Principal Problem:   Abdominal pain Active Problems:   Hypertension   Hypothyroid   Acute cholecystitis   ARF (acute renal failure) (HCC)   Atrial fibrillation with RVR (HCC)   Acute cholecystitis  Postop day #3 from laparoscopic cholecystectomy  Abdomen benign.  No problems with incision  Continue on Zosyn.  Will transition to Augmentin p.o. when cleared by surgery  A. fib with RVR  Remains in A. fib  On metoprolol 100 mg twice daily  Cardizem added today to try to improve control  Surgery cleared to start heparin: Start heparin drip, without bolus.  Patient may need cardioversion tomorrow if remains unstable.  Acute renal  failure  Creatinine improving:  1.42 currently.  We will continue to trend and anticipate that this will continue to improve as his oral hydration improves  Hypothyroidism  Patient has normal TSH  Hypertension  Transitioned to beta-blocker.  Code Status: Full code Family Communication: None Disposition Plan: Patient should be able to return home   Consultants:  General surgery  Cardiology  Procedures:  Laparoscopic cholecystectomy  Antibiotics:  Zosyn, day 4  HPI/Subjective: Patient doing well.  Appetite is good.  He is tolerating diet.  Abdominal pain is improving.  Has stool  Objective: Vitals:   06/04/19 0727 06/04/19 0848  BP: (!) 150/112   Pulse: 68 (!) 140  Resp: 20   Temp: 97.9 F (36.6 C)   SpO2: 95%     Intake/Output Summary (Last 24 hours) at 06/04/2019 1013 Last data filed at 06/04/2019 0900 Gross per 24 hour  Intake 1200 ml  Output 581 ml  Net 619 ml   Filed Weights   06/02/19 0512 06/03/19 0500 06/04/19 0522  Weight: 121.5 kg 121 kg 122.4 kg    Exam:   General: A&O x3.  No acute distress.  Cardiovascular: Irregularly irregular rate, normal S1-S2 sounds.  Respiratory: Clear to auscultation bilaterally with no wheezes, rales, rhonchi  Abdomen: Mild generalized abdominal tenderness.  Incisions clean, dry, intact.  Musculoskeletal: No peripheral edema or leg tenderness.  Extremity: Extremities warm to touch with 2+ dorsalis pedis and radial pulses  Data Reviewed: Basic Metabolic Panel: Recent Labs  Lab 05/31/19 0403 06/01/19 0417 06/02/19 0406 06/03/19 0402 06/04/19 0444  NA 140 143 143 145 144  K 4.0 3.9 4.2 4.2 4.3  CL 105 106 107 109 111  CO2 22 25 25 26 26   GLUCOSE 137* 133* 118* 121* 112*  BUN 69* 52* 36* 28* 25*  CREATININE 2.28* 1.72* 1.62* 1.42* 1.47*  CALCIUM 8.5* 8.6* 8.4* 8.5* 8.4*  MG  --  2.8*  --   --   --    Liver Function Tests: Recent Labs  Lab 05/31/19 0403 06/01/19 0417 06/02/19 0406  06/03/19 0402 06/04/19 0444  AST 29 39 52* 26 24  ALT 34 47* 74* 56* 51*  ALKPHOS 75 89 84 73 79  BILITOT 1.5* 1.5* 0.9 0.8 0.7  PROT 7.0 7.2 6.8 6.4* 6.2*  ALBUMIN 2.8* 2.5* 2.5* 2.3* 2.2*   Recent Labs  Lab 05/30/19 1137 06/01/19 0417  LIPASE 32 44   No results for input(s): AMMONIA in the last 168 hours. CBC: Recent Labs  Lab 05/31/19 0403 06/01/19 0417 06/02/19 0406 06/03/19 0402 06/04/19 0444  WBC 13.1* 10.7* 10.5 9.8 10.3  NEUTROABS 10.3* 7.8*  --   --   --   HGB 12.6* 12.0* 12.1* 11.8* 11.1*  HCT 40.4 36.7* 38.2* 36.9* 34.5*  MCV 97.8 92.9 94.6 95.1 94.8  PLT 270 298 294 298 328   Cardiac Enzymes: No results for input(s): CKTOTAL, CKMB, CKMBINDEX, TROPONINI in the last 168 hours. BNP (last 3 results) Recent Labs    05/30/19 1600  BNP 75.9    ProBNP (last 3 results) No results for input(s): PROBNP in the last 8760 hours.  CBG: Recent Labs  Lab 05/31/19 2130 06/01/19 0603  GLUCAP 136* 120*    Recent Results (from the past 240 hour(s))  Novel Coronavirus, NAA ([facility])     Status: None   Collection Time: 05/29/19 12:00 AM   Specimen: Nasopharyngeal(NP) swabs in vial transport medium   NASOPHARYNGE  TESTING  Result Value Ref Range Status   SARS-CoV-2, NAA Not Detected Not Detected Final    Comment: This nucleic acid amplification test was developed and its performance characteristics determined by 14/07/20. Nucleic acid amplification tests include PCR and TMA. This test has not been FDA cleared or approved. This test has been authorized by FDA under an Emergency Use Authorization (EUA). This test is only authorized for the duration of time the declaration that circumstances exist justifying the authorization of the emergency use of in vitro diagnostic tests for detection of SARS-CoV-2 virus and/or diagnosis of COVID-19 infection under section 564(b)(1) of the Act, 21 U.S.C. [employer]) (1), unless the authorization is terminated or  revoked sooner. When diagnostic testing is negative, the possibility of a false negative result should be considered in the context of a patient's recent exposures and the presence of clinical signs and symptoms consistent with COVID-19. An individual without symptoms of COVID-19 and who is not shedding SARS-CoV-2 virus would  expect to have a negative (not detected) result in this assay.   SARS CORONAVIRUS 2 (TAT 6-24 HRS) Nasopharyngeal Nasopharyngeal Swab     Status: None   Collection Time: 05/30/19  3:50 PM   Specimen: Nasopharyngeal Swab  Result Value Ref Range Status   SARS Coronavirus 2 NEGATIVE NEGATIVE Final    Comment: (NOTE) SARS-CoV-2 target nucleic acids are NOT DETECTED. The SARS-CoV-2 RNA is generally detectable in upper and lower respiratory specimens during the acute phase of infection. Negative results do not preclude SARS-CoV-2 infection, do not rule out co-infections with other pathogens, and should not be used as  the sole basis for treatment or other patient management decisions. Negative results must be combined with clinical observations, patient history, and epidemiological information. The expected result is Negative. Fact Sheet for Patients: HairSlick.nohttps://www.fda.gov/media/138098/download Fact Sheet for Healthcare Providers: quierodirigir.comhttps://www.fda.gov/media/138095/download This test is not yet approved or cleared by the [place] FDA and  has been authorized for detection and/or diagnosis of SARS-CoV-2 by FDA under an Emergency Use Authorization (EUA). This EUA will remain  in effect (meaning this test can be used) for the duration of the COVID-19 declaration under Section 56 4(b)(1) of the Act, 21 U.S.C. section 360bbb-3(b)(1), unless the authorization is terminated or revoked sooner. Performed at [facility], [address] [place], [place] [address]   Surgical PCR screen     Status: None   Collection Time: 05/31/19 10:13 PM   Specimen: Nasal Mucosa;  Nasal Swab  Result Value Ref Range Status   MRSA, PCR NEGATIVE NEGATIVE Final   Staphylococcus aureus NEGATIVE NEGATIVE Final    Comment: (NOTE) The Xpert SA Assay (FDA approved for NASAL specimens in patients 60 years of age and older), is one component of a comprehensive surveillance program. It is not intended to diagnose infection nor to guide or monitor treatment. Performed at [facility], [address] [place], [place] [address]      Studies: No results found.  Scheduled Meds: . acetaminophen  1,000 mg Oral Q8H  . diltiazem  30 mg Oral Q6H  . metoprolol tartrate  100 mg Oral BID   Continuous Infusions: . sodium chloride    . sodium chloride 75 mL/hr at 06/03/19 2243  . piperacillin-tazobactam (ZOSYN)  IV 3.375 g (06/04/19 [id number])      Time spent: 25   Levie HeritageJacob J Yug Loria, DO    06/04/2019, 10:13 AM  LOS: 5 days

## 2019-06-04 NOTE — Plan of Care (Signed)
   Problem: Education: Goal: Knowledge of General Education information will improve Description: Including pain rating scale, medication(s)/side effects and non-pharmacologic comfort measures Outcome: Progressing   Problem: Health Behavior/Discharge Planning: Goal: Ability to manage health-related needs will improve Outcome: Progressing   Problem: Clinical Measurements: Goal: Cardiovascular complication will be avoided Outcome: Progressing Note: Able to tolerate PO medication to regulate Afib. Will continue to monitor HR and adjust medications as needed   Problem: Activity: Goal: Risk for activity intolerance will decrease Outcome: Progressing   Problem: Education: Goal: Knowledge of disease or condition will improve Outcome: Progressing Goal: Understanding of medication regimen will improve Outcome: Progressing

## 2019-06-04 NOTE — Progress Notes (Signed)
Patient has had poor heart rate control. Cardiology is aware. New dose of PO Cardizem has been added to help with heart rate control. Will reassess

## 2019-06-05 DIAGNOSIS — K922 Gastrointestinal hemorrhage, unspecified: Secondary | ICD-10-CM | POA: Diagnosis not present

## 2019-06-05 DIAGNOSIS — R1013 Epigastric pain: Secondary | ICD-10-CM

## 2019-06-05 LAB — CBC
HCT: 33 % — ABNORMAL LOW (ref 39.0–52.0)
Hemoglobin: 10.5 g/dL — ABNORMAL LOW (ref 13.0–17.0)
MCH: 30.6 pg (ref 26.0–34.0)
MCHC: 31.8 g/dL (ref 30.0–36.0)
MCV: 96.2 fL (ref 80.0–100.0)
Platelets: 357 10*3/uL (ref 150–400)
RBC: 3.43 MIL/uL — ABNORMAL LOW (ref 4.22–5.81)
RDW: 15 % (ref 11.5–15.5)
WBC: 10.2 10*3/uL (ref 4.0–10.5)
nRBC: 0 % (ref 0.0–0.2)

## 2019-06-05 LAB — COMPREHENSIVE METABOLIC PANEL
ALT: 66 U/L — ABNORMAL HIGH (ref 0–44)
AST: 38 U/L (ref 15–41)
Albumin: 2.3 g/dL — ABNORMAL LOW (ref 3.5–5.0)
Alkaline Phosphatase: 78 U/L (ref 38–126)
Anion gap: 8 (ref 5–15)
BUN: 21 mg/dL — ABNORMAL HIGH (ref 6–20)
CO2: 23 mmol/L (ref 22–32)
Calcium: 8.4 mg/dL — ABNORMAL LOW (ref 8.9–10.3)
Chloride: 113 mmol/L — ABNORMAL HIGH (ref 98–111)
Creatinine, Ser: 1.22 mg/dL (ref 0.61–1.24)
GFR calc Af Amer: >60 mL/min (ref >60)
GFR calc non Af Amer: >60 mL/min (ref >60)
Glucose, Bld: 106 mg/dL — ABNORMAL HIGH (ref 70–99)
Potassium: 4.3 mmol/L (ref 3.5–5.1)
Sodium: 144 mmol/L (ref 135–145)
Total Bilirubin: 0.7 mg/dL (ref 0.3–1.2)
Total Protein: 6 g/dL — ABNORMAL LOW (ref 6.5–8.1)

## 2019-06-05 LAB — HEMOGLOBIN AND HEMATOCRIT, BLOOD
HCT: 32.8 % — ABNORMAL LOW (ref 39.0–52.0)
Hemoglobin: 10.4 g/dL — ABNORMAL LOW (ref 13.0–17.0)

## 2019-06-05 LAB — HEPARIN LEVEL (UNFRACTIONATED): Heparin Unfractionated: 0.34 IU/mL (ref 0.30–0.70)

## 2019-06-05 MED ORDER — LABETALOL HCL 5 MG/ML IV SOLN
10.0000 mg | INTRAVENOUS | Status: DC | PRN
  Administered 2019-06-05 – 2019-06-07 (×4): 10 mg via INTRAVENOUS
  Filled 2019-06-05 (×5): qty 4

## 2019-06-05 NOTE — Progress Notes (Addendum)
EKG was completed. Unable to print.

## 2019-06-05 NOTE — Progress Notes (Signed)
Patient up to toilet and reports straining to have a BM. Patient experiencing some frank blood loss from Rectum. Less blood than this AM. Patient is not symptomatic. Will page MD

## 2019-06-05 NOTE — Significant Event (Signed)
Patient found to have significant amount of frank blood in stool while up to restroom prior to shift change. Patient is comfortable in bed. Vital signs complete and DO paged and has seen patient at bedside. Heparin has been discontinued. Will continue to monitor  Jannette Cotham [place], RN

## 2019-06-05 NOTE — Progress Notes (Signed)
[name], male   DOB: 13-Jul-1960, 58 y.o.   MRN: [id number]   [facility] PROGRESS NOTE  [employer] JSH:[id number] DOB: [birth year] DOA: 05/30/2019 PCP: Ronnald Nian, MD  Brief Summary: HPI: Martin [name] is a 58 y.o. male with history of hypertension and hyperlipidemia presents to the ER with complaints of abdominal pain with nausea vomiting over the last 2 days.  Pain is mostly in the right upper quadrant.  Denies any diarrhea.  Pain is constant irrespective of the diet.  Denies any blood in the vomitus.  Denies any chest pain or shortness of breath.  ED Course: In the ER patient was found to be in A. fib with RVR for which patient was placed on Cardizem infusion after Cardizem bolus.  COVID-19 test was negative.  Labs show acute renal failure with creatinine having worsened from 1 in April 2019 and presently is around 3.75.  Also lab work show leukocytosis of 17.5.  LFTs were normal.  CT abdomen and pelvis shows features concerning for cholecystitis with possible perforation.  On-call general surgeon Dr. Derrell Lolling was consulted who requested to get sonogram of the abdomen which only shows cholecystitis.  Patient is on empiric antibiotics Cardizem infusion and admitted for further management.  Assessment/Plan: Principal Problem:   Abdominal pain Active Problems:   Hypertension   Hypothyroid   Acute cholecystitis   ARF (acute renal failure) (HCC)   Atrial fibrillation with RVR (HCC)   Acute cholecystitis  Postop day #4 from laparoscopic cholecystectomy  Abdomen benign.  No problems with incision  Continue on Zosyn.  Will transition to Augmentin p.o. when cleared by surgery  A. fib with RVR  Remains in A. fib  On metoprolol 100 mg twice daily  Cardizem added 12/13  Heparin drip, without bolus started 12/13 in anticipation of cardioversion, stopped 12/14 due to GI bleed  Patient spontaneously converted to NSR overnight. Will continue to  monitor.  Acute lower GI bleed  Started 12/14 after initiation of heparin the day prior in anticipation of cardioversion due to A. Fib.  Heparin stopped  Repeat CBC  Patient is not having any abdominal pain and I do not feel that the patient's abdomen is more distended than prior.  Given that the JP drain continues to have the same serosanguineous fluid, I do not believe that the patient is having frank bleeding from the surgical sites.  Acute renal failure  Initial Cr on admission: 3.75   Creatinine improving:  1.22 currently.  We will continue to trend and anticipate that this will continue to improve as his oral hydration improves  Hypothyroidism  Patient has normal TSH  Hypertension  Transitioned to beta-blocker.  Code Status: Full code Family Communication: None Disposition Plan: Patient should be able to return home   Consultants:  General surgery  Cardiology  Procedures:  Laparoscopic cholecystectomy  Antibiotics:  Zosyn, day 4  HPI/Subjective: Patient started on heparin last night.  Had bloody stool this morning.  Patient denies lightheadedness, dizziness.  Abdomen nontender.  Continues to drain serosanguineous fluid from the JP drain, the color and viscosity are unchanged from yesterday.  Objective: Vitals:   06/05/19 0654 06/05/19 0725  BP: (!) 159/104 (!) 161/101  Pulse: 78 80  Resp:  18  Temp:  98.8 F (37.1 C)  SpO2:  95%    Intake/Output Summary (Last 24 hours) at 06/05/2019 0744 Last data filed at 06/05/2019 0400 Gross per 24 hour  Intake 3059.03 ml  Output 250 ml  Net 2809.03 ml   Filed Weights   06/03/19 0500 06/04/19 0522 06/05/19 0414  Weight: 121 kg 122.4 kg 124 kg    Exam:   General: A&O x3.  No acute distress.  Cardiovascular: Irregularly irregular rate, normal S1-S2 sounds.  Respiratory: Clear to auscultation bilaterally with no wheezes, rales, rhonchi  Abdomen: Mild generalized abdominal tenderness.  Incisions  clean, dry, intact.  Musculoskeletal: No peripheral edema or leg tenderness.  Extremity: Extremities warm to touch with 2+ dorsalis pedis and radial pulses  Data Reviewed: Basic Metabolic Panel: Recent Labs  Lab 06/01/19 0417 06/02/19 0406 06/03/19 0402 06/04/19 0444 06/05/19 0307  NA 143 143 145 144 144  K 3.9 4.2 4.2 4.3 4.3  CL 106 107 109 111 113*  CO2 25 25 26 26 23   GLUCOSE 133* 118* 121* 112* 106*  BUN 52* 36* 28* 25* 21*  CREATININE 1.72* 1.62* 1.42* 1.47* 1.22  CALCIUM 8.6* 8.4* 8.5* 8.4* 8.4*  MG 2.8*  --   --   --   --    Liver Function Tests: Recent Labs  Lab 06/01/19 0417 06/02/19 0406 06/03/19 0402 06/04/19 0444 06/05/19 0307  AST 39 52* 26 24 38  ALT 47* 74* 56* 51* 66*  ALKPHOS 89 84 73 79 78  BILITOT 1.5* 0.9 0.8 0.7 0.7  PROT 7.2 6.8 6.4* 6.2* 6.0*  ALBUMIN 2.5* 2.5* 2.3* 2.2* 2.3*   Recent Labs  Lab 05/30/19 1137 06/01/19 0417  LIPASE 32 44   No results for input(s): AMMONIA in the last 168 hours. CBC: Recent Labs  Lab 05/31/19 0403 06/01/19 0417 06/02/19 0406 06/03/19 0402 06/04/19 0444 06/05/19 0307  WBC 13.1* 10.7* 10.5 9.8 10.3 10.2  NEUTROABS 10.3* 7.8*  --   --   --   --   HGB 12.6* 12.0* 12.1* 11.8* 11.1* 10.5*  HCT 40.4 36.7* 38.2* 36.9* 34.5* 33.0*  MCV 97.8 92.9 94.6 95.1 94.8 96.2  PLT 270 298 294 298 328 357   Cardiac Enzymes: No results for input(s): CKTOTAL, CKMB, CKMBINDEX, TROPONINI in the last 168 hours. BNP (last 3 results) Recent Labs    05/30/19 1600  BNP 75.9    ProBNP (last 3 results) No results for input(s): PROBNP in the last 8760 hours.  CBG: Recent Labs  Lab 05/31/19 2130 06/01/19 0603  GLUCAP 136* 120*    Recent Results (from the past 240 hour(s))  Novel Coronavirus, NAA ([facility])     Status: None   Collection Time: 05/29/19 12:00 AM   Specimen: Nasopharyngeal(NP) swabs in vial transport medium   NASOPHARYNGE  TESTING  Result Value Ref Range Status   SARS-CoV-2, NAA Not Detected Not  Detected Final    Comment: This nucleic acid amplification test was developed and its performance characteristics determined by [facility]. Nucleic acid amplification tests include PCR and TMA. This test has not been FDA cleared or approved. This test has been authorized by FDA under an Emergency Use Authorization (EUA). This test is only authorized for the duration of time the declaration that circumstances exist justifying the authorization of the emergency use of in vitro diagnostic tests for detection of SARS-CoV-2 virus and/or diagnosis of COVID-19 infection under section 564(b)(1) of the Act, 21 U.S.C. 956OZH-0(Q360bbb-3(b) (1), unless the authorization is terminated or revoked sooner. When diagnostic testing is negative, the possibility of a false negative result should be considered in the context of a patient's recent exposures and the presence of clinical signs and symptoms consistent with COVID-19. An individual without  symptoms of COVID-19 and who is not shedding SARS-CoV-2 virus would  expect to have a negative (not detected) result in this assay.   SARS CORONAVIRUS 2 (TAT 6-24 HRS) Nasopharyngeal Nasopharyngeal Swab     Status: None   Collection Time: 05/30/19  3:50 PM   Specimen: Nasopharyngeal Swab  Result Value Ref Range Status   SARS Coronavirus 2 NEGATIVE NEGATIVE Final    Comment: (NOTE) SARS-CoV-2 target nucleic acids are NOT DETECTED. The SARS-CoV-2 RNA is generally detectable in upper and lower respiratory specimens during the acute phase of infection. Negative results do not preclude SARS-CoV-2 infection, do not rule out co-infections with other pathogens, and should not be used as the sole basis for treatment or other patient management decisions. Negative results must be combined with clinical observations, patient history, and epidemiological information. The expected result is Negative. Fact Sheet for  Patients: SugarRoll.be Fact Sheet for Healthcare Providers: https://www.woods-mathews.com/ This test is not yet approved or cleared by the [place] FDA and  has been authorized for detection and/or diagnosis of SARS-CoV-2 by FDA under an Emergency Use Authorization (EUA). This EUA will remain  in effect (meaning this test can be used) for the duration of the COVID-19 declaration under Section 56 4(b)(1) of the Act, 21 U.S.C. section 360bbb-3(b)(1), unless the authorization is terminated or revoked sooner. Performed at [facility], [place] [address], [place], [place] [address]   Surgical PCR screen     Status: None   Collection Time: 05/31/19 10:13 PM   Specimen: Nasal Mucosa; Nasal Swab  Result Value Ref Range Status   MRSA, PCR NEGATIVE NEGATIVE Final   Staphylococcus aureus NEGATIVE NEGATIVE Final    Comment: (NOTE) The Xpert SA Assay (FDA approved for NASAL specimens in patients 16 years of age and older), is one component of a comprehensive surveillance program. It is not intended to diagnose infection nor to guide or monitor treatment. Performed at [facility], [place] [address] [place], [place] [address]      Studies: No results found.  Scheduled Meds: . acetaminophen  1,000 mg Oral Q8H  . diltiazem  30 mg Oral Q6H  . metoprolol tartrate  100 mg Oral BID   Continuous Infusions: . sodium chloride    . sodium chloride 75 mL/hr at 06/05/19 0409  . piperacillin-tazobactam (ZOSYN)  IV 3.375 g (06/05/19 0433)      Time spent: [place], DO    06/05/2019, 7:44 AM  LOS: 6 days

## 2019-06-05 NOTE — Plan of Care (Addendum)
  Problem: Education: Goal: Knowledge of General Education information will improve Description: Including pain rating scale, medication(s)/side effects and non-pharmacologic comfort measures Outcome: Progressing   Problem: Health Behavior/Discharge Planning: Goal: Ability to manage health-related needs will improve Outcome: Progressing Note: Discussion of heart healthy eating and lifestyle modifications to help reduce future cardiac risk including ways to decrease LDLs and increase HDLs   Problem: Clinical Measurements: Goal: Cardiovascular complication will be avoided Outcome: Progressing Note: BP is slightly elevated but HR is stable and maintains NSR   Problem: Pain Managment: Goal: General experience of comfort will improve Outcome: Progressing Note: Admission pain has resolved   Problem: Activity: Goal: Ability to tolerate increased activity will improve Outcome: Progressing

## 2019-06-05 NOTE — Progress Notes (Signed)
[facility] Progress Note  4 Days Post-Op   Assessment/Plan LVH CKD Hypertension Hypothyroid Hyperlipidemia Atrial fibrillation with RVR- new  - above per medicine  Acute cholecystitis - S/P laparoscopic partial cholecystectomy, Dr. Georgette Dover, 12/10 POD# 4 - JP drain is sanguinous, continue, will be discharged with drain - pain improved, he is tolerating a diet - recheck labs in am  [place] diet ID: Zosyn 12/8-12/14 DVT: SCDs Follow-up: [facility]  PLan:  pt is okay for discharge from a surgical standpoint.      LOS: 6 days    Subjective: CC: no complaints  Pt states blood in his stool this am. He states this happens at home sometimes. He states he has had colonoscopies and he thinks they said something to him about why he has intermittent blood in his stools. He was unsure of diagnosis. He denies abdominal pain, nausea, vomiting, fever or chills. He is tolerating a diet.   Objective: Vital signs in last 24 hours: Temp:  [98 F (36.7 C)-99.5 F (37.5 C)] 98.8 F (37.1 C) (12/14 0725) Pulse Rate:  [74-140] 80 (12/14 0725) Resp:  [18-20] 18 (12/14 0725) BP: (102-178)/(83-116) 161/101 (12/14 0725) SpO2:  [95 %-99 %] 95 % (12/14 0725) Weight:  [124 kg] 124 kg (12/14 0414) Last BM Date: 06/03/19  Intake/Output from previous day: 12/13 0701 - 12/14 0700 In: 3059 [P.O.:1370; I.V.:1608.5; IV Piggyback:80.6] Out: 275 [Urine:200; Drains:75] Intake/Output this shift: No intake/output data recorded.  PE:  Gen:  Alert, NAD, pleasant, cooperative Pulm:  Rate and effort normal Abd: Soft, obese, ND, +BS, incisions C/D/I, drain with minimal dark sanguinous drainage, no TTP. No peritonitis  Skin: no rashes noted, warm and dry   Anti-infectives: Anti-infectives (From admission, onward)   Start     Dose/Rate Route Frequency Ordered Stop   05/31/19 0300  piperacillin-tazobactam (ZOSYN) IVPB 3.375 g     3.375 g 12.5 mL/hr over 240 Minutes Intravenous  Every 8 hours 05/30/19 2242     05/30/19 2030  piperacillin-tazobactam (ZOSYN) IVPB 3.375 g     3.375 g 100 mL/hr over 30 Minutes Intravenous  Once 05/30/19 2022 05/30/19 2316      Lab Results:  Recent Labs    06/04/19 0444 06/05/19 0307  WBC 10.3 10.2  HGB 11.1* 10.5*  HCT 34.5* 33.0*  PLT 328 357   BMET Recent Labs    06/04/19 0444 06/05/19 0307  NA 144 144  K 4.3 4.3  CL 111 113*  CO2 26 23  GLUCOSE 112* 106*  BUN 25* 21*  CREATININE 1.47* 1.22  CALCIUM 8.4* 8.4*   PT/INR No results for input(s): LABPROT, INR in the last 72 hours. CMP     Component Value Date/Time   NA 144 06/05/2019 0307   NA 145 (H) 10/06/2017 1142   K 4.3 06/05/2019 0307   CL 113 (H) 06/05/2019 0307   CO2 23 06/05/2019 0307   GLUCOSE 106 (H) 06/05/2019 0307   BUN 21 (H) 06/05/2019 0307   BUN 17 10/06/2017 1142   CREATININE 1.22 06/05/2019 0307   CREATININE 1.18 04/03/2016 0754   CALCIUM 8.4 (L) 06/05/2019 0307   PROT 6.0 (L) 06/05/2019 0307   PROT 7.3 10/06/2017 1142   ALBUMIN 2.3 (L) 06/05/2019 0307   ALBUMIN 4.4 10/06/2017 1142   AST 38 06/05/2019 0307   ALT 66 (H) 06/05/2019 0307   ALKPHOS 78 06/05/2019 0307   BILITOT 0.7 06/05/2019 0307   BILITOT 0.8 10/06/2017 1142   GFRNONAA >60 06/05/2019 4132  GFRAA >60 06/05/2019 0307   Lipase     Component Value Date/Time   LIPASE 44 06/01/2019 0417    Studies/Results: No results found.   Jerre Simon, PA-C [facility] Please see amion for pager for the following: Venita Lick, W, & Friday 7:00am - 4:30pm Thursdays 7:00am -11:30am

## 2019-06-05 NOTE — Progress Notes (Signed)
ANTICOAGULATION CONSULT NOTE - Follow Up Consult  Pharmacy Consult for heparin Indication: atrial fibrillation   Labs: Recent Labs    06/03/19 0402 06/04/19 0444 06/04/19 1641 06/05/19 0307  HGB 11.8* 11.1*  --  10.5*  HCT 36.9* 34.5*  --  33.0*  PLT 298 328  --  357  HEPARINUNFRC  --   --  0.22* 0.34  CREATININE 1.42* 1.47*  --  1.22    Assessment/Plan:  58yo male therapeutic on heparin after rate change. Will continue gtt at current rate and confirm stable with additional level.     Wynona Neat, PharmD, BCPS  06/05/2019,4:23 AM

## 2019-06-05 NOTE — Progress Notes (Signed)
Patient up to bathroom. Stool and Urine appear free of any evidence of blood. Will continue to monitor for any acute change.

## 2019-06-05 NOTE — Progress Notes (Signed)
CARDIOLOGY CONSULT NOTE       Patient IDBoe Deans MRN: [id number] DOB/AGE: March 21, 1961 58 y.o.  Admit date: 05/30/2019 Primary Physician: Ronnald Nian, MD Primary Cardiologist: Armanda Magic*  Principal Problem:   Abdominal pain Active Problems:   Hypertension   Hypothyroid   Acute cholecystitis   ARF (acute renal failure) (HCC)   Atrial fibrillation with RVR (HCC)   Subjective:  No cardiac complaints active BRB per rectum this am   ROS All other systems reviewed and negative except as noted above     acetaminophen  1,000 mg Oral Q8H   diltiazem  30 mg Oral Q6H   metoprolol tartrate  100 mg Oral BID    sodium chloride     sodium chloride 75 mL/hr at 06/05/19 0409   heparin 1,700 Units/hr (06/04/19 2012)   piperacillin-tazobactam (ZOSYN)  IV 3.375 g (06/05/19 0433)    Physical Exam: Blood pressure (!) 159/104, pulse 78, temperature 98.1 F (36.7 C), temperature source Oral, resp. rate 18, height 6' 1.5" (1.867 m), weight 124 kg, SpO2 98 %.    Labs:   Lab Results  Component Value Date   WBC 10.2 06/05/2019   HGB 10.5 (L) 06/05/2019   HCT 33.0 (L) 06/05/2019   MCV 96.2 06/05/2019   PLT 357 06/05/2019    Recent Labs  Lab 06/05/19 0307  NA 144  K 4.3  CL 113*  CO2 23  BUN 21*  CREATININE 1.22  CALCIUM 8.4*  PROT 6.0*  BILITOT 0.7  ALKPHOS 78  ALT 66*  AST 38  GLUCOSE 106*   Lab Results  Component Value Date   TROPONINI 0.02        NO INDICATION OF MYOCARDIAL INJURY. 12/24/2006    Lab Results  Component Value Date   CHOL 138 10/06/2017   CHOL 127 04/03/2016   CHOL 135 02/18/2015   Lab Results  Component Value Date   HDL 47 10/06/2017   HDL 43 04/03/2016   HDL 47 02/18/2015   Lab Results  Component Value Date   LDLCALC 74 10/06/2017   LDLCALC 67 04/03/2016   LDLCALC 73 02/18/2015   Lab Results  Component Value Date   TRIG 85 10/06/2017   TRIG 85 04/03/2016   TRIG 77 02/18/2015   Lab Results  Component  Value Date   CHOLHDL 2.9 10/06/2017   CHOLHDL 3.0 04/03/2016   CHOLHDL 2.9 02/18/2015   No results found for: LDLDIRECT    Radiology: CT ABDOMEN PELVIS WO CONTRAST  Result Date: 05/30/2019 CLINICAL DATA:  Acute renal failure EXAM: CT ABDOMEN AND PELVIS WITHOUT CONTRAST TECHNIQUE: Multidetector CT imaging of the abdomen and pelvis was performed following the standard protocol without IV contrast. COMPARISON:  None. FINDINGS: Lower chest: There is atelectasis at the lung bases.The heart size is mildly enlarged Hepatobiliary: There is decreased hepatic attenuation suggestive of hepatic steatosis. There is significant pericholecystic fat stranding and free fluid. The gallbladder is mildly distended. There is a 3.2 x 2.3 cm collection abutting the gallbladder fundus and adjacent:. This is best visualized on the coronal series. Pancreas: Normal contours without ductal dilatation. No peripancreatic fluid collection. Spleen: No splenic laceration or hematoma. Adrenals/Urinary Tract: --Adrenal glands: No adrenal hemorrhage. --Right kidney/ureter: No hydronephrosis or perinephric hematoma. --Left kidney/ureter: No hydronephrosis or perinephric hematoma. --Urinary bladder: Unremarkable. Stomach/Bowel: --Stomach/Duodenum: No hiatal hernia or other gastric abnormality. Normal duodenal course and caliber. --Small bowel: No dilatation or inflammation. --Colon: No focal abnormality. --Appendix: Normal. Vascular/Lymphatic: Atherosclerotic calcification is present  within the non-aneurysmal abdominal aorta, without hemodynamically significant stenosis. --No retroperitoneal lymphadenopathy. --No mesenteric lymphadenopathy. --No pelvic or inguinal lymphadenopathy. Reproductive: Unremarkable Other: No ascites or free air. There are bilateral fat containing inguinal hernias. Musculoskeletal. No acute displaced fractures. IMPRESSION: 1. Pericholecystic fat stranding and free fluid, consistent with acute cholecystitis in the  appropriate clinical setting. There is a 3.2 x 2.3 cm collection abutting the gallbladder fundus and the colon, raising concern for perforation. This is suboptimally evaluated in the absence of IV contrast. 2. Hepatic steatosis. 3. Bilateral fat containing inguinal hernias. 4. No hydronephrosis.  No radiopaque obstructing kidney stones. Aortic Atherosclerosis (ICD10-I70.0). Electronically Signed   By: Constance Holster M.D.   On: 05/30/2019 20:13   DG Chest Port 1 View  Result Date: 05/30/2019 CLINICAL DATA:  Shortness of breath. EXAM: PORTABLE CHEST 1 VIEW COMPARISON:  Oct 24, 2016 FINDINGS: Minimal atelectasis in the left base. No pneumothorax. The heart, hila, mediastinum, lungs, and pleura are normal. IMPRESSION: No acute abnormalities. Electronically Signed   By: Dorise Bullion III M.D   On: 05/30/2019 14:41   ECHOCARDIOGRAM COMPLETE  Result Date: 05/31/2019   ECHOCARDIOGRAM REPORT   Patient Name:   Martin [name] Date of Exam: 05/31/2019 Medical Rec #:  [id number]         Height:       73.0 in Accession #:    [id number]        Weight:       270.0 lb Date of Birth:  Jun 14, 1961          BSA:          2.44 m Patient Age:    9 years          BP:           20/92 mmHg Patient Gender: M                 HR:           103 bpm. Exam Location:  Inpatient Procedure: 2D Echo Indications:    Atrial Fibrillation 427.31 / I48.91  History:        Patient has no prior history of Echocardiogram examinations.                 Arrythmias:Atrial Fibrillation; Risk Factors:Hypertension,                 Dyslipidemia and Non-Smoker. Acute Renal Failure, LVH.  Sonographer:    Leavy Cella Referring Phys: [place]  1. Left ventricular ejection fraction, by visual estimation, is 55 to 60%. The left ventricle has normal function. There is mildly increased left ventricular hypertrophy.  2. Left ventricular diastolic function could not be evaluated.  3. The left ventricle has no regional wall motion  abnormalities.  4. Global right ventricle has normal systolic function.The right ventricular size is normal. No increase in right ventricular wall thickness.  5. Left atrial size was normal.  6. Right atrial size was normal.  7. Presence of pericardial fat pad.  8. Mild mitral annular calcification.  9. The mitral valve is degenerative. Trivial mitral valve regurgitation. 10. The tricuspid valve is grossly normal. Tricuspid valve regurgitation is trivial. 11. The aortic valve is tricuspid. Aortic valve regurgitation is not visualized. 12. The pulmonic valve was grossly normal. Pulmonic valve regurgitation is not visualized. 13. Normal pulmonary artery systolic pressure. 14. The inferior vena cava is normal in size with greater than 50% respiratory variability, suggesting right atrial pressure  of 3 mmHg. 15. No prior Echocardiogram. FINDINGS  Left Ventricle: Left ventricular ejection fraction, by visual estimation, is 55 to 60%. The left ventricle has normal function. The left ventricle has no regional wall motion abnormalities. There is mildly increased left ventricular hypertrophy. Concentric left ventricular hypertrophy. The left ventricular diastology could not be evaluated due to atrial fibrillation. Left ventricular diastolic function could not be evaluated. Right Ventricle: The right ventricular size is normal. No increase in right ventricular wall thickness. Global RV systolic function is has normal systolic function. The tricuspid regurgitant velocity is 2.36 m/s, and with an assumed right atrial pressure  of 3 mmHg, the estimated right ventricular systolic pressure is normal at 25.3 mmHg. Left Atrium: Left atrial size was normal in size. Right Atrium: Right atrial size was normal in size Pericardium: There is no evidence of pericardial effusion. Presence of pericardial fat pad. Mitral Valve: The mitral valve is degenerative in appearance. Mild mitral annular calcification. Trivial mitral valve  regurgitation. Tricuspid Valve: The tricuspid valve is grossly normal. Tricuspid valve regurgitation is trivial. Aortic Valve: The aortic valve is tricuspid. Aortic valve regurgitation is not visualized. There is moderate calcification of the aortic valve. Pulmonic Valve: The pulmonic valve was grossly normal. Pulmonic valve regurgitation is not visualized. Pulmonic regurgitation is not visualized. Aorta: The aortic root and ascending aorta are structurally normal, with no evidence of dilitation. Venous: The inferior vena cava is normal in size with greater than 50% respiratory variability, suggesting right atrial pressure of 3 mmHg. IAS/Shunts: No atrial level shunt detected by color flow Doppler.  LEFT VENTRICLE PLAX 2D LVIDd:         5.20 cm  Diastology LVIDs:         3.50 cm  LV e' lateral:   12.10 cm/s LV PW:         1.60 cm  LV E/e' lateral: 4.2 LV IVS:        1.40 cm  LV e' medial:    10.60 cm/s LVOT diam:     2.20 cm  LV E/e' medial:  4.8 LV SV:         79 ml LV SV Index:   30.81 LVOT Area:     3.80 cm  RIGHT VENTRICLE TAPSE (M-mode): 1.9 cm LEFT ATRIUM             Index       RIGHT ATRIUM           Index LA diam:        3.70 cm 1.51 cm/m  RA Area:     11.50 cm LA Vol (A2C):   42.0 ml 17.18 ml/m RA Volume:   24.80 ml  10.15 ml/m LA Vol (A4C):   33.3 ml 13.62 ml/m LA Biplane Vol: 39.8 ml 16.28 ml/m   AORTA Ao Root diam: 3.00 cm MITRAL VALVE                        TRICUSPID VALVE MV Area (PHT): 2.93 cm             TR Peak grad:   22.3 mmHg MV PHT:        75.11 msec           TR Vmax:        236.00 cm/s MV Decel Time: 259 msec MV E velocity: 51.00 cm/s 103 cm/s  SHUNTS MV A velocity: 22.70 cm/s 70.3 cm/s Systemic Diam: 2.20 cm MV E/A ratio:  2.25       1.5  Lennie OdorWesley O'Neal MD Electronically signed by Lennie OdorWesley O'Neal MD Signature Date/Time: 05/31/2019/12:12:14 PM    Final    US Abdomen Limited RUQ  Result Date: 05/30/2019 CLINICAL DATA:  Pain. Additional history provided by scanning technologist: Right  upper quadrant pain for 2 days. EXAM: ULTRASOUND ABDOMEN LIMITED RIGHT UPPER QUADRANT COMPARISON:  CT abdomen/pelvis performed earlier the same day 05/30/2019 FINDINGS: Gallbladder: Cholecystolithiasis. Gallbladder wall thickening to 5 mm with pericholecystic fluid. Additionally, a sonographic Eulah PontMurphy sign is elicited by the scanning technologist. Common bile duct: Diameter: The visualized common duct is dilated to 8 mm in diameter. Liver: No focal lesion identified. Increased hepatic parenchymal echogenicity. Interrogated portal vein is patent on color Doppler imaging with normal direction of blood flow towards the liver. IMPRESSION: Cholecystolithiasis with gallbladder wall thickening and pericholecystic fluid. Additionally, a sonographic Eulah PontMurphy sign is elicited by the scanning technologist. Findings are consistent with acute cholecystitis in the appropriate clinical setting. Extrahepatic biliary ductal dilatation with the visualized common duct measuring 8 mm in diameter. Increased hepatic parenchymal echogenicity. This finding is nonspecific, but most commonly seen in the setting of hepatic steatosis. Electronically Signed   By: Jackey LogeKyle  Golden DO   On: 05/30/2019 22:29    EKG: NSR rate 80    ASSESSMENT AND PLAN:   1. PAF:  Converted to NSR continue cardizem and lopressor will d/c heparin as he has had BRBPR this am Hb/Hct written for will need GI consult EF normal 55-60% by TTE 05/31/19 with normal LA size [place] VASC 1 no anticoagulation   2. RBBB:  Transient ECG this am resolved   3. GB:  Post lap choly per surgery   Signed: Charlton Hawseter Mehul Rudin 06/05/2019, 7:06 AM

## 2019-06-06 ENCOUNTER — Inpatient Hospital Stay (HOSPITAL_COMMUNITY): Payer: BC Managed Care – PPO

## 2019-06-06 DIAGNOSIS — Z9049 Acquired absence of other specified parts of digestive tract: Secondary | ICD-10-CM

## 2019-06-06 LAB — CBC
HCT: 33.4 % — ABNORMAL LOW (ref 39.0–52.0)
Hemoglobin: 10.4 g/dL — ABNORMAL LOW (ref 13.0–17.0)
MCH: 30.1 pg (ref 26.0–34.0)
MCHC: 31.1 g/dL (ref 30.0–36.0)
MCV: 96.8 fL (ref 80.0–100.0)
Platelets: 387 10*3/uL (ref 150–400)
RBC: 3.45 MIL/uL — ABNORMAL LOW (ref 4.22–5.81)
RDW: 14.9 % (ref 11.5–15.5)
WBC: 8.7 10*3/uL (ref 4.0–10.5)
nRBC: 0 % (ref 0.0–0.2)

## 2019-06-06 LAB — HEPATIC FUNCTION PANEL
ALT: 69 U/L — ABNORMAL HIGH (ref 0–44)
AST: 38 U/L (ref 15–41)
Albumin: 2.5 g/dL — ABNORMAL LOW (ref 3.5–5.0)
Alkaline Phosphatase: 69 U/L (ref 38–126)
Bilirubin, Direct: <0.1 mg/dL (ref 0.0–0.2)
Total Bilirubin: 0.6 mg/dL (ref 0.3–1.2)
Total Protein: 6.2 g/dL — ABNORMAL LOW (ref 6.5–8.1)

## 2019-06-06 MED ORDER — TECHNETIUM TC 99M MEBROFENIN IV KIT
5.4100 | PACK | Freq: Once | INTRAVENOUS | Status: AC | PRN
  Administered 2019-06-06: 5.41 via INTRAVENOUS

## 2019-06-06 MED ORDER — AMOXICILLIN-POT CLAVULANATE 875-125 MG PO TABS
1.0000 | ORAL_TABLET | Freq: Two times a day (BID) | ORAL | Status: DC
  Administered 2019-06-06 – 2019-06-07 (×3): 1 via ORAL
  Filled 2019-06-06 (×3): qty 1

## 2019-06-06 MED ORDER — DILTIAZEM HCL ER COATED BEADS 120 MG PO CP24
120.0000 mg | ORAL_CAPSULE | Freq: Every day | ORAL | Status: DC
  Administered 2019-06-06 – 2019-06-07 (×2): 120 mg via ORAL
  Filled 2019-06-06 (×2): qty 1

## 2019-06-06 MED ORDER — LOSARTAN POTASSIUM 25 MG PO TABS
25.0000 mg | ORAL_TABLET | Freq: Every day | ORAL | Status: DC
  Administered 2019-06-06 – 2019-06-07 (×2): 25 mg via ORAL
  Filled 2019-06-06 (×2): qty 1

## 2019-06-06 NOTE — Significant Event (Signed)
Called by RN regarding patient's BP. Noted to be 160s/100s after 10mg IV labetalol PRN push. Per documentation, home BP medications held at the time of admission due to softer BP while on cardizem gtt. Ordered losartan 25mg  to restart this AM. Patient currently asymptomatic.   Martin [name] K. Marletta Lor, MD

## 2019-06-06 NOTE — Consult Note (Signed)
[facility] Consult: 8:33 AM 06/06/2019  LOS: 7 days    Referring Provider: Dr Nehemiah Settle Primary Care Physician:  [name], MD Primary Gastroenterologist:  Dr. Olevia Perches     Reason for Consultation: Bile leak in patient s/p subtotal cholecystectomy.  Red blood per rectum.   HPI: Martin [name] is a 58 y.o. male.  PMH listed below 04/2009 Colonoscopy, average risk screening.  Delfin Edis, MD.  Completely normal study suggest follow-up in 10 years  Patient admitted nearly a week ago with 4 days of right abdominal pain, nausea, vomiting.  Ultrasound, noncontrast CT scan confirmed cholecystitis and positive sonographic Murphy sign, ? GB perforation near the fundus with associated fluid collection, fatty liver.  T bili 1.7, and normal alk phos/transaminases initially though transaminases rose slightly over the course of a few days. S/p 12/10 lap chole.  Gallbladder thickened, adherent, w perforation into the liver with abscess in the area which was drained.  GB only partially removed, IOC not performed. Developed A. fib/RVR postoperatively, started on heparin with anticipation of DC cardioversion.  Cardioversion not required as A. fib spontaneously resolved.  However he did develop some bloody stools overnight 12/13 -12/14, these have resolved with cessation of heparin and no long-term anticoagulation will be required.  Hgb stable at 10.4, it was 12 to 13.4 initially.  Other issues include AKI which has greatly improved.  This morning there is bilious appearing drainage into the JP drain, previous days there was pinkish/bloody discharge.  Repeat LFTs and HIDA scan pndg.  Patient has no worsening of abdominal pain in fact his pain is very minimal.  Tolerating solid diet.  Feels well. Scribes the rectal bleeding as  blood mixed in with loose, brown stool.  It happened overnight a couple of times and then later that day, yesterday, stool turned brown.  Stool this morning also brown and not bloody.  Patient has external hemorrhoids but they do not bleed or cause itching or pain.  Fm Hx. his parents died in their 27s when he was quite young..  They had kidney disease and some sort of cancer but he is not sure what type. 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   Does not smoke or chew tobacco.  Does not drink alcohol.  No history of significant EtOH use.  Past Medical History:  Diagnosis Date  . Cardiomegaly    LVH  . Cataract   . Hemorrhoids   . Hyperlipidemia   . Hypertension   . Hypothyroidism   . Lower GI bleeding     Past Surgical History:  Procedure Laterality Date  . CHOLECYSTECTOMY N/A 06/01/2019   Procedure: LAPAROSCOPIC CHOLECYSTECTOMY;  Surgeon: [name], MD;  Location: [facility];  Service: General;  Laterality: N/A;  . DOPPLER ECHOCARDIOGRAPHY      Prior to Admission medications   Medication Sig Start Date End Date Taking? Authorizing Provider  amLODipine (NORVASC) 10 MG tablet Take 1 tablet (10 mg total) by mouth daily. 10/06/17  Yes [name], MD  aspirin 81  MG tablet Take 81 mg by mouth daily.     Yes [provider]  cetirizine (ZYRTEC ALLERGY) 10 MG tablet Take 1 tablet (10 mg total) by mouth daily. Patient taking differently: Take 10 mg by mouth daily as needed for allergies.  09/14/16  Yes Kirichenko, Tatyana, PA-C  hydrochlorothiazide (HYDRODIURIL) 25 MG tablet Take 25 mg by mouth daily. 03/28/19  Yes [provider]  losartan (COZAAR) 100 MG tablet Take 100 mg by mouth daily. 03/28/19  Yes [provider]  metoprolol tartrate (LOPRESSOR) 50 MG tablet Take 50 mg by mouth 2 (two) times daily. 03/28/19  Yes [provider]  pravastatin (PRAVACHOL) 40 MG tablet Take 1 tablet (40 mg total) by mouth daily.  10/06/17  Yes [name], MD  sildenafil (REVATIO) 20 MG tablet Take 1 to 5 pills as needed Patient taking differently: Take 20-120 mg by mouth daily as needed (ED).  07/06/16  Yes [name], MD  traMADol (ULTRAM) 50 MG tablet Take 50 mg by mouth every 6 (six) hours as needed for moderate pain.   Yes [provider]  azithromycin (ZITHROMAX Z-PAK) 250 MG tablet Take 2 tabs today and 1 tab for the next 4 days Patient not taking: Reported on 10/29/2016 10/24/16   Rodell Perna A, PA-C  benzonatate (TESSALON) 100 MG capsule Take 1 capsule (100 mg total) by mouth every 8 (eight) hours. Patient not taking: Reported on 10/29/2016 09/14/16   Jeannett Senior, PA-C  losartan-hydrochlorothiazide (HYZAAR) 100-25 MG tablet Take 1 tablet by mouth daily. Patient not taking: Reported on 05/30/2019 10/06/17   [name], MD  metoprolol succinate (TOPROL-XL) 100 MG 24 hr tablet TAKE 1 TABLET BY MOUTH ONCE DAILY WITH  OR  IMMEDIATELY  FOLLOWING  A  MEAL Patient not taking: Reported on 05/30/2019 10/06/17   [name], MD  triamcinolone cream (KENALOG) 0.5 % Apply 1 application topically 3 (three) times daily. Patient not taking: Reported on 05/30/2019 01/21/14   Mancel Bale, PA-C    Scheduled Meds: . acetaminophen  1,000 mg Oral Q8H  . amoxicillin-clavulanate  1 tablet Oral Q12H  . diltiazem  120 mg Oral Daily  . losartan  25 mg Oral Daily  . metoprolol tartrate  100 mg Oral BID   Infusions: . sodium chloride    . sodium chloride 75 mL/hr at 06/05/19 1757   PRN Meds: sodium chloride, labetalol, morphine injection, ondansetron **OR** ondansetron (ZOFRAN) IV, oxyCODONE   Allergies as of 05/30/2019 - Review Complete 05/30/2019  Allergen Reaction Noted  . Garlic Swelling 41/74/0814  . Shrimp [shellfish allergy] Swelling 01/21/2014    Family History  Problem Relation Age of Onset  . Kidney disease Mother   . Hypertension Father   . Kidney disease Father   . Asthma Father      Social History   Socioeconomic History  . Marital status: Single    Spouse name: Not on file  . Number of children: 1  . Years of education: Not on file  . Highest education level: Not on file  Occupational History  . Occupation: Medical illustrator: [employer]  Tobacco Use  . Smoking status: Never Smoker  . Smokeless tobacco: Never Used  Substance and Sexual Activity  . Alcohol use: Yes    Comment: Rare  . Drug use: No  . Sexual activity: Yes  Other Topics Concern  . Not on file  Social History Narrative  . Not on file  Social Determinants of Health   Financial Resource Strain:   . Difficulty of Paying Living Expenses: Not on file  Food Insecurity:   . Worried About Charity fundraiser in the Last Year: Not on file  . Ran Out of Food in the Last Year: Not on file  Transportation Needs:   . Lack of Transportation (Medical): Not on file  . Lack of Transportation (Non-Medical): Not on file  Physical Activity:   . Days of Exercise per Week: Not on file  . Minutes of Exercise per Session: Not on file  Stress:   . Feeling of Stress : Not on file  Social Connections:   . Frequency of Communication with Friends and Family: Not on file  . Frequency of Social Gatherings with Friends and Family: Not on file  . Attends Religious Services: Not on file  . Active Member of Clubs or Organizations: Not on file  . Attends Archivist Meetings: Not on file  . Marital Status: Not on file  Intimate Partner Violence:   . Fear of Current or Ex-Partner: Not on file  . Emotionally Abused: Not on file  . Physically Abused: Not on file  . Sexually Abused: Not on file    REVIEW OF SYSTEMS: Constitutional: Feels well.  No weakness. ENT:  No nose bleeds Pulm: No trouble breathing, no cough. CV:  No palpitations, no LE edema.  No angina. GU:  No hematuria, no frequency GI: Normally has brown stools every other day. Heme: Other than the recent rectal bleeding, no unusual or  excessive bleeding or bruising. Transfusions: None. Neuro:  No headaches, no peripheral tingling or numbness.  No syncope, no seizures, no dizziness. Derm:  No itching, no rash or sores.  Endocrine:  No sweats or chills.  No polyuria or dysuria Immunization: Up-to-date on flu shot.  Vaccination history reviewed. Travel:  None beyond local counties in last few months.    PHYSICAL EXAM: Vital signs in last 24 hours: Vitals:   06/06/19 0409 06/06/19 0726  BP: (!) 167/106 (!) 171/102  Pulse: 71 74  Resp: 16 20  Temp: 98.3 F (36.8 C) 98.3 F (36.8 C)  SpO2: 98% 98%   Wt Readings from Last 3 Encounters:  06/06/19 123.7 kg  10/06/17 116.3 kg  10/29/16 116.6 kg    General: Pleasant, nonill looking, comfortable Head: No facial asymmetry or swelling.  No signs of head trauma. Eyes: No scleral icterus.  No conjunctival pallor.  EOMI. Ears: Not hard of hearing Nose: No congestion or discharge. Mouth: Pharynx moist, pink, clear.  Tongue midline. Neck: No JVD, no masses, no thyromegaly. Lungs: No labored breathing or cough.  Lungs clear bilaterally. Heart: RRR.  No MRG.  S1, S2 present. Abdomen: Soft.  Amazingly no tenderness even over the surgical and drain site.  Bowel sounds active.  Slightly obese.[name]   Rectal: Large, nonbleeding hemorrhoids.  Did not perform DRE. Musc/Skeltl: No joint redness, swelling or gross deformity. Extremities: No CCE. Neurologic: Fully alert and oriented x3.  Moves all 4 limbs without tremor or weakness.  Able to sit up from a chair and transfer to the bed easily. Skin: No rash, no sores Tattoos: None observed. Nodes: No cervical adenopathy Psych: Calm, pleasant, cooperative.  Intake/Output from previous day: 12/14 0701 - 12/15 0700 In: 2186.3 [P.O.:1320; I.V.:748.3; IV Piggyback:118] Out: 6378 [Urine:1000; Drains:85] Intake/Output this shift: No intake/output data recorded.  LAB RESULTS: Recent Labs    06/04/19 0444 06/05/19 0307 06/05/19 0955  06/06/19  0540  WBC 10.3 10.2  --  8.7  HGB 11.1* 10.5* 10.4* 10.4*  HCT 34.5* 33.0* 32.8* 33.4*  PLT 328 357  --  387   BMET Lab Results  Component Value Date   NA 144 06/05/2019   NA 144 06/04/2019   NA 145 06/03/2019   K 4.3 06/05/2019   K 4.3 06/04/2019   K 4.2 06/03/2019   CL 113 (H) 06/05/2019   CL 111 06/04/2019   CL 109 06/03/2019   CO2 23 06/05/2019   CO2 26 06/04/2019   CO2 26 06/03/2019   GLUCOSE 106 (H) 06/05/2019   GLUCOSE 112 (H) 06/04/2019   GLUCOSE 121 (H) 06/03/2019   BUN 21 (H) 06/05/2019   BUN 25 (H) 06/04/2019   BUN 28 (H) 06/03/2019   CREATININE 1.22 06/05/2019   CREATININE 1.47 (H) 06/04/2019   CREATININE 1.42 (H) 06/03/2019   CALCIUM 8.4 (L) 06/05/2019   CALCIUM 8.4 (L) 06/04/2019   CALCIUM 8.5 (L) 06/03/2019   LFT Recent Labs    06/04/19 0444 06/05/19 0307  PROT 6.2* 6.0*  ALBUMIN 2.2* 2.3*  AST 24 38  ALT 51* 66*  ALKPHOS 79 78  BILITOT 0.7 0.7    Lipase     Component Value Date/Time   LIPASE 44 06/01/2019 0417    IMPRESSION:   *   Bilious drainage from JP drain 6 days post sub-total cholecustectomy.  R/o bile leak.  LFTs and HIDA scan pending  *    Limited blood per rectum, resolved with cessation of heparin.  Large external hemorrhoids on DRE are the likely source. Benign, unremarkable colonoscopy 04/2009.  Is now due for 10-year follow-up.  *   Transient atrial fibrillation with rapid rate, resolved.  Heparin discontinued and patient did not require DCCV.  *    Hypertension.   PLAN:     *    Await LFTs and HIDA scan to make decision regarding ERCP, stent.    *   Regarding work-up of the bleeding per rectum, this can be postponed/deferred to the outpatient setting   Azucena Freed  06/06/2019, 8:33 AM Phone [phone]

## 2019-06-06 NOTE — Plan of Care (Signed)

## 2019-06-06 NOTE — Progress Notes (Signed)
HIDA scan negative for bile leak Ordered JP drain fluid sent for Amylase and bili levels.   No plans for ERCP at present.    S Layanna Charo PA-C [phone] [phone].

## 2019-06-06 NOTE — Progress Notes (Signed)
Pt elevated despite scheduled and PRN medications. Pt asymptomatic but frustrated that his BP has been elevated. He is not on his home medication regimen and does not want his BP to keep him from being discharged. MD paged. And stated she will check into his home meds.

## 2019-06-06 NOTE — Progress Notes (Signed)
[facility] Progress Note  5 Days Post-Op  Subjective: Patient denies abdominal pain, n/v. Tolerating diet. Drain fluid has not been bilious prior to today. Discussed possible bile leak and plan for ruling out and management.   Objective: Vital signs in last 24 hours: Temp:  [98.3 F (36.8 C)-99.4 F (37.4 C)] 98.3 F (36.8 C) (12/15 0726) Pulse Rate:  [68-79] 74 (12/15 0726) Resp:  [16-20] 20 (12/15 0726) BP: (150-175)/(90-119) 171/102 (12/15 0726) SpO2:  [98 %-100 %] 98 % (12/15 0726) Weight:  [123.7 kg] 123.7 kg (12/15 0407) Last BM Date: 06/05/19  Intake/Output from previous day: 12/14 0701 - 12/15 0700 In: 2186.3 [P.O.:1320; I.V.:748.3; IV Piggyback:118] Out: 0932 [Urine:1000; Drains:85] Intake/Output this shift: No intake/output data recorded.  PE: Gen: Alert, NAD, pleasant, cooperative Pulm:Rate andeffort normal Abd: Soft,obese,ND, +BS, incisions C/D/I, drain with bilious drainage, no TTP. No peritonitis Skin: no rashes noted, warm and dry  Lab Results:  Recent Labs    06/05/19 0307 06/05/19 0955 06/06/19 0540  WBC 10.2  --  8.7  HGB 10.5* 10.4* 10.4*  HCT 33.0* 32.8* 33.4*  PLT 357  --  387   BMET Recent Labs    06/04/19 0444 06/05/19 0307  NA 144 144  K 4.3 4.3  CL 111 113*  CO2 26 23  GLUCOSE 112* 106*  BUN 25* 21*  CREATININE 1.47* 1.22  CALCIUM 8.4* 8.4*   PT/INR No results for input(s): LABPROT, INR in the last 72 hours. CMP     Component Value Date/Time   NA 144 06/05/2019 0307   NA 145 (H) 10/06/2017 1142   K 4.3 06/05/2019 0307   CL 113 (H) 06/05/2019 0307   CO2 23 06/05/2019 0307   GLUCOSE 106 (H) 06/05/2019 0307   BUN 21 (H) 06/05/2019 0307   BUN 17 10/06/2017 1142   CREATININE 1.22 06/05/2019 0307   CREATININE 1.18 04/03/2016 0754   CALCIUM 8.4 (L) 06/05/2019 0307   PROT 6.0 (L) 06/05/2019 0307   PROT 7.3 10/06/2017 1142   ALBUMIN 2.3 (L) 06/05/2019 0307   ALBUMIN 4.4 10/06/2017 1142   AST 38  06/05/2019 0307   ALT 66 (H) 06/05/2019 0307   ALKPHOS 78 06/05/2019 0307   BILITOT 0.7 06/05/2019 0307   BILITOT 0.8 10/06/2017 1142   GFRNONAA >60 06/05/2019 0307   GFRAA >60 06/05/2019 0307   Lipase     Component Value Date/Time   LIPASE 44 06/01/2019 0417       Studies/Results: No results found.  Anti-infectives: Anti-infectives (From admission, onward)   Start     Dose/Rate Route Frequency Ordered Stop   06/06/19 1300  amoxicillin-clavulanate (AUGMENTIN) 875-125 MG per tablet 1 tablet     1 tablet Oral Every 12 hours 06/06/19 0749 06/16/19 0959   05/31/19 0300  piperacillin-tazobactam (ZOSYN) IVPB 3.375 g  Status:  Discontinued     3.375 g 12.5 mL/hr over 240 Minutes Intravenous Every 8 hours 05/30/19 2242 06/06/19 0749   05/30/19 2030  piperacillin-tazobactam (ZOSYN) IVPB 3.375 g     3.375 g 100 mL/hr over 30 Minutes Intravenous  Once 05/30/19 2022 05/30/19 2316       Assessment/Plan LVH CKD Hypertension Hypothyroid Hyperlipidemia Atrial fibrillation with RVR- new -above per medicine  Acute cholecystitis - S/P laparoscopic partial cholecystectomy, Dr. Georgette Dover, 12/10POD#5 - JP drain is bilious - HIDA ordered, GI consulted - pain improved, he is tolerating a diet - ordered repeat hepatic function panel  FEN:NPO ID: Zosyn 12/8-12/14 DVT: SCDs Follow-up: [facility]  PLan: HIDA to rule out bile leak and GI consult  LOS: 7 days    Wells Guiles , [facility] 06/06/2019, 8:26 AM Please see Amion for pager number during day hours 7:00am-4:30pm

## 2019-06-06 NOTE — Progress Notes (Signed)
CARDIOLOGY CONSULT NOTE       Patient IDYoan Sallade MRN: [id number] DOB/AGE: 1961-03-24 58 y.o.  Admit date: 05/30/2019 Primary Physician: Ronnald Nian, MD Primary Cardiologist: Armanda Magic*  Principal Problem:   Abdominal pain Active Problems:   Hypertension   Hypothyroid   Acute cholecystitis   ARF (acute renal failure) (HCC)   Atrial fibrillation with RVR (HCC)   Acute lower GI bleeding   Subjective:  No cardiac complaints no further rectal bleeding   ROS All other systems reviewed and negative except as noted above    . acetaminophen  1,000 mg Oral Q8H  . amoxicillin-clavulanate  1 tablet Oral Q12H  . diltiazem  120 mg Oral Daily  . losartan  25 mg Oral Daily  . metoprolol tartrate  100 mg Oral BID   . sodium chloride    . sodium chloride 75 mL/hr at 06/05/19 1757    Physical Exam: Blood pressure (!) 171/102, pulse 74, temperature 98.3 F (36.8 C), temperature source Oral, resp. rate 20, height 6' 1.5" (1.867 m), weight 123.7 kg, SpO2 98 %.    Labs:   Lab Results  Component Value Date   WBC 8.7 06/06/2019   HGB 10.4 (L) 06/06/2019   HCT 33.4 (L) 06/06/2019   MCV 96.8 06/06/2019   PLT 387 06/06/2019    Recent Labs  Lab 06/05/19 0307  NA 144  K 4.3  CL 113*  CO2 23  BUN 21*  CREATININE 1.22  CALCIUM 8.4*  PROT 6.0*  BILITOT 0.7  ALKPHOS 78  ALT 66*  AST 38  GLUCOSE 106*   Lab Results  Component Value Date   TROPONINI 0.02        NO INDICATION OF MYOCARDIAL INJURY. 12/24/2006    Lab Results  Component Value Date   CHOL 138 10/06/2017   CHOL 127 04/03/2016   CHOL 135 02/18/2015   Lab Results  Component Value Date   HDL 47 10/06/2017   HDL 43 04/03/2016   HDL 47 02/18/2015   Lab Results  Component Value Date   LDLCALC 74 10/06/2017   LDLCALC 67 04/03/2016   LDLCALC 73 02/18/2015   Lab Results  Component Value Date   TRIG 85 10/06/2017   TRIG 85 04/03/2016   TRIG 77 02/18/2015   Lab Results  Component  Value Date   CHOLHDL 2.9 10/06/2017   CHOLHDL 3.0 04/03/2016   CHOLHDL 2.9 02/18/2015   No results found for: LDLDIRECT    Radiology: CT ABDOMEN PELVIS WO CONTRAST  Result Date: 05/30/2019 CLINICAL DATA:  Acute renal failure EXAM: CT ABDOMEN AND PELVIS WITHOUT CONTRAST TECHNIQUE: Multidetector CT imaging of the abdomen and pelvis was performed following the standard protocol without IV contrast. COMPARISON:  None. FINDINGS: Lower chest: There is atelectasis at the lung bases.The heart size is mildly enlarged Hepatobiliary: There is decreased hepatic attenuation suggestive of hepatic steatosis. There is significant pericholecystic fat stranding and free fluid. The gallbladder is mildly distended. There is a 3.2 x 2.3 cm collection abutting the gallbladder fundus and adjacent:. This is best visualized on the coronal series. Pancreas: Normal contours without ductal dilatation. No peripancreatic fluid collection. Spleen: No splenic laceration or hematoma. Adrenals/Urinary Tract: --Adrenal glands: No adrenal hemorrhage. --Right kidney/ureter: No hydronephrosis or perinephric hematoma. --Left kidney/ureter: No hydronephrosis or perinephric hematoma. --Urinary bladder: Unremarkable. Stomach/Bowel: --Stomach/Duodenum: No hiatal hernia or other gastric abnormality. Normal duodenal course and caliber. --Small bowel: No dilatation or inflammation. --Colon: No focal abnormality. --Appendix: Normal. Vascular/Lymphatic: Atherosclerotic  calcification is present within the non-aneurysmal abdominal aorta, without hemodynamically significant stenosis. --No retroperitoneal lymphadenopathy. --No mesenteric lymphadenopathy. --No pelvic or inguinal lymphadenopathy. Reproductive: Unremarkable Other: No ascites or free air. There are bilateral fat containing inguinal hernias. Musculoskeletal. No acute displaced fractures. IMPRESSION: 1. Pericholecystic fat stranding and free fluid, consistent with acute cholecystitis in the  appropriate clinical setting. There is a 3.2 x 2.3 cm collection abutting the gallbladder fundus and the colon, raising concern for perforation. This is suboptimally evaluated in the absence of IV contrast. 2. Hepatic steatosis. 3. Bilateral fat containing inguinal hernias. 4. No hydronephrosis.  No radiopaque obstructing kidney stones. Aortic Atherosclerosis (ICD10-I70.0). Electronically Signed   By: Constance Holster M.D.   On: 05/30/2019 20:13   DG Chest Port 1 View  Result Date: 05/30/2019 CLINICAL DATA:  Shortness of breath. EXAM: PORTABLE CHEST 1 VIEW COMPARISON:  Oct 24, 2016 FINDINGS: Minimal atelectasis in the left base. No pneumothorax. The heart, hila, mediastinum, lungs, and pleura are normal. IMPRESSION: No acute abnormalities. Electronically Signed   By: Dorise Bullion III M.D   On: 05/30/2019 14:41   ECHOCARDIOGRAM COMPLETE  Result Date: 05/31/2019   ECHOCARDIOGRAM REPORT   Patient Name:   RODNEY YERA Date of Exam: 05/31/2019 Medical Rec #:  [id number]         Height:       73.0 in Accession #:    [id number]        Weight:       270.0 lb Date of Birth:  02-Jul-1960          BSA:          2.44 m Patient Age:    42 years          BP:           20/92 mmHg Patient Gender: M                 HR:           103 bpm. Exam Location:  Inpatient Procedure: 2D Echo Indications:    Atrial Fibrillation 427.31 / I48.91  History:        Patient has no prior history of Echocardiogram examinations.                 Arrythmias:Atrial Fibrillation; Risk Factors:Hypertension,                 Dyslipidemia and Non-Smoker. Acute Renal Failure, LVH.  Sonographer:    Leavy Cella Referring Phys: [place]  1. Left ventricular ejection fraction, by visual estimation, is 55 to 60%. The left ventricle has normal function. There is mildly increased left ventricular hypertrophy.  2. Left ventricular diastolic function could not be evaluated.  3. The left ventricle has no regional wall motion  abnormalities.  4. Global right ventricle has normal systolic function.The right ventricular size is normal. No increase in right ventricular wall thickness.  5. Left atrial size was normal.  6. Right atrial size was normal.  7. Presence of pericardial fat pad.  8. Mild mitral annular calcification.  9. The mitral valve is degenerative. Trivial mitral valve regurgitation. 10. The tricuspid valve is grossly normal. Tricuspid valve regurgitation is trivial. 11. The aortic valve is tricuspid. Aortic valve regurgitation is not visualized. 12. The pulmonic valve was grossly normal. Pulmonic valve regurgitation is not visualized. 13. Normal pulmonary artery systolic pressure. 14. The inferior vena cava is normal in size with greater than 50% respiratory variability, suggesting  right atrial pressure of 3 mmHg. 15. No prior Echocardiogram. FINDINGS  Left Ventricle: Left ventricular ejection fraction, by visual estimation, is 55 to 60%. The left ventricle has normal function. The left ventricle has no regional wall motion abnormalities. There is mildly increased left ventricular hypertrophy. Concentric left ventricular hypertrophy. The left ventricular diastology could not be evaluated due to atrial fibrillation. Left ventricular diastolic function could not be evaluated. Right Ventricle: The right ventricular size is normal. No increase in right ventricular wall thickness. Global RV systolic function is has normal systolic function. The tricuspid regurgitant velocity is 2.36 m/s, and with an assumed right atrial pressure  of 3 mmHg, the estimated right ventricular systolic pressure is normal at 25.3 mmHg. Left Atrium: Left atrial size was normal in size. Right Atrium: Right atrial size was normal in size Pericardium: There is no evidence of pericardial effusion. Presence of pericardial fat pad. Mitral Valve: The mitral valve is degenerative in appearance. Mild mitral annular calcification. Trivial mitral valve  regurgitation. Tricuspid Valve: The tricuspid valve is grossly normal. Tricuspid valve regurgitation is trivial. Aortic Valve: The aortic valve is tricuspid. Aortic valve regurgitation is not visualized. There is moderate calcification of the aortic valve. Pulmonic Valve: The pulmonic valve was grossly normal. Pulmonic valve regurgitation is not visualized. Pulmonic regurgitation is not visualized. Aorta: The aortic root and ascending aorta are structurally normal, with no evidence of dilitation. Venous: The inferior vena cava is normal in size with greater than 50% respiratory variability, suggesting right atrial pressure of 3 mmHg. IAS/Shunts: No atrial level shunt detected by color flow Doppler.  LEFT VENTRICLE PLAX 2D LVIDd:         5.20 cm  Diastology LVIDs:         3.50 cm  LV e' lateral:   12.10 cm/s LV PW:         1.60 cm  LV E/e' lateral: 4.2 LV IVS:        1.40 cm  LV e' medial:    10.60 cm/s LVOT diam:     2.20 cm  LV E/e' medial:  4.8 LV SV:         79 ml LV SV Index:   30.81 LVOT Area:     3.80 cm  RIGHT VENTRICLE TAPSE (M-mode): 1.9 cm LEFT ATRIUM             Index       RIGHT ATRIUM           Index LA diam:        3.70 cm 1.51 cm/m  RA Area:     11.50 cm LA Vol (A2C):   42.0 ml 17.18 ml/m RA Volume:   24.80 ml  10.15 ml/m LA Vol (A4C):   33.3 ml 13.62 ml/m LA Biplane Vol: 39.8 ml 16.28 ml/m   AORTA Ao Root diam: 3.00 cm MITRAL VALVE                        TRICUSPID VALVE MV Area (PHT): 2.93 cm             TR Peak grad:   22.3 mmHg MV PHT:        75.11 msec           TR Vmax:        236.00 cm/s MV Decel Time: 259 msec MV E velocity: 51.00 cm/s 103 cm/s  SHUNTS MV A velocity: 22.70 cm/s 70.3 cm/s Systemic Diam: 2.20 cm  MV E/A ratio:  2.25       1.5  Lennie Odor MD Electronically signed by Lennie Odor MD Signature Date/Time: 05/31/2019/12:12:14 PM    Final    US Abdomen Limited RUQ  Result Date: 05/30/2019 CLINICAL DATA:  Pain. Additional history provided by scanning technologist: Right  upper quadrant pain for 2 days. EXAM: ULTRASOUND ABDOMEN LIMITED RIGHT UPPER QUADRANT COMPARISON:  CT abdomen/pelvis performed earlier the same day 05/30/2019 FINDINGS: Gallbladder: Cholecystolithiasis. Gallbladder wall thickening to 5 mm with pericholecystic fluid. Additionally, a sonographic Eulah Pont sign is elicited by the scanning technologist. Common bile duct: Diameter: The visualized common duct is dilated to 8 mm in diameter. Liver: No focal lesion identified. Increased hepatic parenchymal echogenicity. Interrogated portal vein is patent on color Doppler imaging with normal direction of blood flow towards the liver. IMPRESSION: Cholecystolithiasis with gallbladder wall thickening and pericholecystic fluid. Additionally, a sonographic Eulah Pont sign is elicited by the scanning technologist. Findings are consistent with acute cholecystitis in the appropriate clinical setting. Extrahepatic biliary ductal dilatation with the visualized common duct measuring 8 mm in diameter. Increased hepatic parenchymal echogenicity. This finding is nonspecific, but most commonly seen in the setting of hepatic steatosis. Electronically Signed   By: Jackey Loge DO   On: 05/30/2019 22:29    EKG: NSR rate 80    ASSESSMENT AND PLAN:   1. PAF:  Converted to NSR continue cardizem and lopressor no anticoagulation   EF normal 55-60% by TTE 05/31/19 with normal LA size [place] VASC 1 no anticoagulation   2. RBBB:  Transient ECG this am resolved   3. GB:  Post lap choly per surgery JP drain still in site looks fine     Signed: Charlton Haws 06/06/2019, 7:55 AM

## 2019-06-06 NOTE — Progress Notes (Signed)
[name], male   DOB: August 18, 1960, 58 y.o.   MRN: [id number]   [facility] PROGRESS NOTE  [facility] OIN:[id number] DOB: [birth year] DOA: 05/30/2019 PCP: Ronnald Nian, MD  Brief Summary: HPI: Martin [name] is a 58 y.o. male with history of hypertension and hyperlipidemia presents to the ER with complaints of abdominal pain with nausea vomiting over the last 2 days.  Pain is mostly in the right upper quadrant.  Denies any diarrhea.  Pain is constant irrespective of the diet.  Denies any blood in the vomitus.  Denies any chest pain or shortness of breath.  ED Course: In the ER patient was found to be in A. fib with RVR for which patient was placed on Cardizem infusion after Cardizem bolus.  COVID-19 test was negative.  Labs show acute renal failure with creatinine having worsened from 1 in April 2019 and presently is around 3.75.  Also lab work show leukocytosis of 17.5.  LFTs were normal.  CT abdomen and pelvis shows features concerning for cholecystitis with possible perforation.  On-call general surgeon Dr. Derrell Lolling was consulted who requested to get sonogram of the abdomen which only shows cholecystitis.  Patient is on empiric antibiotics Cardizem infusion and admitted for further management.  Assessment/Plan: Principal Problem:   Abdominal pain Active Problems:   Hypertension   Hypothyroid   Acute cholecystitis   ARF (acute renal failure) (HCC)   Atrial fibrillation with RVR (HCC)   Acute lower GI bleeding   Acute cholecystitis  Postop day #5 from laparoscopic cholecystectomy  Abdomen benign.  No problems with incision  Patient transitioned to Augmentin  Having bilious drainage from JP drain  GI consulted  HIDA scan ordered by surgery  A. fib with RVR  Remains in A. fib  On metoprolol 100 mg twice daily  Cardizem added 12/13  Heparin drip, without bolus started 12/13 in anticipation of cardioversion, stopped 12/14 due to GI  bleed  Patient spontaneously converted to NSR 12/14. Will continue to monitor.  Acute lower GI bleed  Started 12/14 after initiation of heparin the day prior in anticipation of cardioversion due to A. Fib. Heparin discontinued. Serial H/H stable.  Acute renal failure  Initial Cr on admission: 3.75   Creatinine improving:  1.22 currently.  Hypothyroidism  Patient has normal TSH  Hypertension  Transitioned to beta-blocker.  Code Status: Full code Family Communication: None Disposition Plan: Patient should be able to return home   Consultants:  General surgery  Cardiology  Procedures:  Laparoscopic cholecystectomy  Antibiotics:  Zosyn, day 4  HPI/Subjective: Doing well. Wants to go home.   Objective: Vitals:   06/06/19 0409 06/06/19 0726  BP: (!) 167/106 (!) 171/102  Pulse: 71 74  Resp: 16 20  Temp: 98.3 F (36.8 C) 98.3 F (36.8 C)  SpO2: 98% 98%    Intake/Output Summary (Last 24 hours) at 06/06/2019 0823 Last data filed at 06/06/2019 0600 Gross per 24 hour  Intake 2186.31 ml  Output 1085 ml  Net 1101.31 ml   Filed Weights   06/04/19 0522 06/05/19 0414 06/06/19 0407  Weight: 122.4 kg 124 kg 123.7 kg    Exam:   General: A&O x3.  No acute distress.  Cardiovascular: Irregularly irregular rate, normal S1-S2 sounds.  Respiratory: Clear to auscultation bilaterally with no wheezes, rales, rhonchi  Abdomen: No abdominal tenderness.  Incisions clean, dry, intact.  Musculoskeletal: No peripheral edema or leg tenderness.  Extremity: Extremities warm to touch with 2+ dorsalis pedis and radial  pulses  Data Reviewed: Basic Metabolic Panel: Recent Labs  Lab 06/01/19 0417 06/02/19 0406 06/03/19 0402 06/04/19 0444 06/05/19 0307  NA 143 143 145 144 144  K 3.9 4.2 4.2 4.3 4.3  CL 106 107 109 111 113*  CO2 25 25 26 26 23   GLUCOSE 133* 118* 121* 112* 106*  BUN 52* 36* 28* 25* 21*  CREATININE 1.72* 1.62* 1.42* 1.47* 1.22  CALCIUM 8.6* 8.4*  8.5* 8.4* 8.4*  MG 2.8*  --   --   --   --    Liver Function Tests: Recent Labs  Lab 06/01/19 0417 06/02/19 0406 06/03/19 0402 06/04/19 0444 06/05/19 0307  AST 39 52* 26 24 38  ALT 47* 74* 56* 51* 66*  ALKPHOS 89 84 73 79 78  BILITOT 1.5* 0.9 0.8 0.7 0.7  PROT 7.2 6.8 6.4* 6.2* 6.0*  ALBUMIN 2.5* 2.5* 2.3* 2.2* 2.3*   Recent Labs  Lab 05/30/19 1137 06/01/19 0417  LIPASE 32 44   No results for input(s): AMMONIA in the last 168 hours. CBC: Recent Labs  Lab 05/31/19 0403 06/01/19 0417 06/02/19 0406 06/03/19 0402 06/04/19 0444 06/05/19 0307 06/05/19 0955 06/06/19 0540  WBC 13.1* 10.7* 10.5 9.8 10.3 10.2  --  8.7  NEUTROABS 10.3* 7.8*  --   --   --   --   --   --   HGB 12.6* 12.0* 12.1* 11.8* 11.1* 10.5* 10.4* 10.4*  HCT 40.4 36.7* 38.2* 36.9* 34.5* 33.0* 32.8* 33.4*  MCV 97.8 92.9 94.6 95.1 94.8 96.2  --  96.8  PLT 270 298 294 298 328 357  --  387   Cardiac Enzymes: No results for input(s): CKTOTAL, CKMB, CKMBINDEX, TROPONINI in the last 168 hours. BNP (last 3 results) Recent Labs    05/30/19 1600  BNP 75.9    ProBNP (last 3 results) No results for input(s): PROBNP in the last 8760 hours.  CBG: Recent Labs  Lab 05/31/19 2130 06/01/19 0603  GLUCAP 136* 120*    Recent Results (from the past 240 hour(s))  Novel Coronavirus, NAA ([facility])     Status: None   Collection Time: 05/29/19 12:00 AM   Specimen: Nasopharyngeal(NP) swabs in vial transport medium   NASOPHARYNGE  TESTING  Result Value Ref Range Status   SARS-CoV-2, NAA Not Detected Not Detected Final    Comment: This nucleic acid amplification test was developed and its performance characteristics determined by [facility]. Nucleic acid amplification tests include PCR and TMA. This test has not been FDA cleared or approved. This test has been authorized by FDA under an Emergency Use Authorization (EUA). This test is only authorized for the duration of time the declaration that  circumstances exist justifying the authorization of the emergency use of in vitro diagnostic tests for detection of SARS-CoV-2 virus and/or diagnosis of COVID-19 infection under section 564(b)(1) of the Act, 21 U.S.C. 621HYQ-6(V360bbb-3(b) (1), unless the authorization is terminated or revoked sooner. When diagnostic testing is negative, the possibility of a false negative result should be considered in the context of a patient's recent exposures and the presence of clinical signs and symptoms consistent with COVID-19. An individual without symptoms of COVID-19 and who is not shedding SARS-CoV-2 virus would  expect to have a negative (not detected) result in this assay.   SARS CORONAVIRUS 2 (TAT 6-24 HRS) Nasopharyngeal Nasopharyngeal Swab     Status: None   Collection Time: 05/30/19  3:50 PM   Specimen: Nasopharyngeal Swab  Result Value Ref Range Status  SARS Coronavirus 2 NEGATIVE NEGATIVE Final    Comment: (NOTE) SARS-CoV-2 target nucleic acids are NOT DETECTED. The SARS-CoV-2 RNA is generally detectable in upper and lower respiratory specimens during the acute phase of infection. Negative results do not preclude SARS-CoV-2 infection, do not rule out co-infections with other pathogens, and should not be used as the sole basis for treatment or other patient management decisions. Negative results must be combined with clinical observations, patient history, and epidemiological information. The expected result is Negative. Fact Sheet for Patients: SugarRoll.be Fact Sheet for Healthcare Providers: https://www.woods-mathews.com/ This test is not yet approved or cleared by the [place] FDA and  has been authorized for detection and/or diagnosis of SARS-CoV-2 by FDA under an Emergency Use Authorization (EUA). This EUA will remain  in effect (meaning this test can be used) for the duration of the COVID-19 declaration under Section 56 4(b)(1) of the  Act, 21 U.S.C. section 360bbb-3(b)(1), unless the authorization is terminated or revoked sooner. Performed at [facility], [place] [address] [place], [place] [address]   Surgical PCR screen     Status: None   Collection Time: 05/31/19 10:13 PM   Specimen: Nasal Mucosa; Nasal Swab  Result Value Ref Range Status   MRSA, PCR NEGATIVE NEGATIVE Final   Staphylococcus aureus NEGATIVE NEGATIVE Final    Comment: (NOTE) The Xpert SA Assay (FDA approved for NASAL specimens in patients 38 years of age and older), is one component of a comprehensive surveillance program. It is not intended to diagnose infection nor to guide or monitor treatment. Performed at [facility], [place] [address], [place], [place] [address]      Studies: No results found.  Scheduled Meds: . acetaminophen  1,000 mg Oral Q8H  . amoxicillin-clavulanate  1 tablet Oral Q12H  . diltiazem  120 mg Oral Daily  . losartan  25 mg Oral Daily  . metoprolol tartrate  100 mg Oral BID   Continuous Infusions: . sodium chloride    . sodium chloride 75 mL/hr at 06/05/19 1757      Time spent: [place], DO    06/06/2019, 8:23 AM  LOS: 7 days

## 2019-06-07 LAB — COMPREHENSIVE METABOLIC PANEL
ALT: 70 U/L — ABNORMAL HIGH (ref 0–44)
AST: 32 U/L (ref 15–41)
Albumin: 2.5 g/dL — ABNORMAL LOW (ref 3.5–5.0)
Alkaline Phosphatase: 69 U/L (ref 38–126)
Anion gap: 7 (ref 5–15)
BUN: 12 mg/dL (ref 6–20)
CO2: 25 mmol/L (ref 22–32)
Calcium: 8.7 mg/dL — ABNORMAL LOW (ref 8.9–10.3)
Chloride: 110 mmol/L (ref 98–111)
Creatinine, Ser: 1.35 mg/dL — ABNORMAL HIGH (ref 0.61–1.24)
GFR calc Af Amer: >60 mL/min (ref >60)
GFR calc non Af Amer: 57 mL/min — ABNORMAL LOW (ref >60)
Glucose, Bld: 103 mg/dL — ABNORMAL HIGH (ref 70–99)
Potassium: 3.9 mmol/L (ref 3.5–5.1)
Sodium: 142 mmol/L (ref 135–145)
Total Bilirubin: 0.3 mg/dL (ref 0.3–1.2)
Total Protein: 6 g/dL — ABNORMAL LOW (ref 6.5–8.1)

## 2019-06-07 LAB — CBC
HCT: 32.8 % — ABNORMAL LOW (ref 39.0–52.0)
Hemoglobin: 10.3 g/dL — ABNORMAL LOW (ref 13.0–17.0)
MCH: 30.1 pg (ref 26.0–34.0)
MCHC: 31.4 g/dL (ref 30.0–36.0)
MCV: 95.9 fL (ref 80.0–100.0)
Platelets: 369 10*3/uL (ref 150–400)
RBC: 3.42 MIL/uL — ABNORMAL LOW (ref 4.22–5.81)
RDW: 15.1 % (ref 11.5–15.5)
WBC: 8.7 10*3/uL (ref 4.0–10.5)
nRBC: 0 % (ref 0.0–0.2)

## 2019-06-07 LAB — AMYLASE, BODY FLUID (OTHER): Amylase, Body Fluid: 50 U/L

## 2019-06-07 MED ORDER — AMOXICILLIN-POT CLAVULANATE 875-125 MG PO TABS: 1.0000 | ORAL_TABLET | Freq: Two times a day (BID) | ORAL | 0 refills | Status: AC

## 2019-06-07 MED ORDER — LOSARTAN POTASSIUM 25 MG PO TABS: 25.0000 mg | ORAL_TABLET | Freq: Every day | ORAL | 0 refills | Status: DC

## 2019-06-07 MED ORDER — DILTIAZEM HCL ER COATED BEADS 120 MG PO CP24: 120.0000 mg | ORAL_CAPSULE | Freq: Every day | ORAL | 0 refills | Status: DC

## 2019-06-07 MED ORDER — METOPROLOL TARTRATE 100 MG PO TABS: 100.0000 mg | ORAL_TABLET | Freq: Two times a day (BID) | ORAL | 0 refills | Status: DC

## 2019-06-07 NOTE — Progress Notes (Addendum)
[facility] Progress Note  6 Days Post-Op  Subjective: Patient denies abdominal pain or nausea. Tolerating diet. HIDA yesterday negative for bile leak. Discussed drain care and follow up in office.   Objective: Vital signs in last 24 hours: Temp:  [97.6 F (36.4 C)-98.9 F (37.2 C)] 98.3 F (36.8 C) (12/16 0330) Pulse Rate:  [68-84] 79 (12/16 0350) Resp:  [20] 20 (12/16 0330) BP: (158-189)/(100-129) 160/101 (12/16 0350) SpO2:  [98 %-100 %] 98 % (12/16 0350) Weight:  [124.1 kg] 124.1 kg (12/16 0330) Last BM Date: 06/07/19  Intake/Output from previous day: 12/15 0701 - 12/16 0700 In: 820 [P.O.:820] Out: 1060 [Urine:1025; Drains:35] Intake/Output this shift: No intake/output data recorded.  PE: Gen: Alert, NAD, pleasant, cooperative Pulm:Rate andeffort normal Abd: Soft,obese,ND, +BS, incisions C/D/I, drain with bilious appearing drainage,no TTP. No peritonitis Skin: no rashes noted, warm and dry   Lab Results:  Recent Labs    06/06/19 0540 06/07/19 0502  WBC 8.7 8.7  HGB 10.4* 10.3*  HCT 33.4* 32.8*  PLT 387 369   BMET Recent Labs    06/05/19 0307 06/07/19 0502  NA 144 142  K 4.3 3.9  CL 113* 110  CO2 23 25  GLUCOSE 106* 103*  BUN 21* 12  CREATININE 1.22 1.35*  CALCIUM 8.4* 8.7*   PT/INR No results for input(s): LABPROT, INR in the last 72 hours. CMP     Component Value Date/Time   NA 142 06/07/2019 0502   NA 145 (H) 10/06/2017 1142   K 3.9 06/07/2019 0502   CL 110 06/07/2019 0502   CO2 25 06/07/2019 0502   GLUCOSE 103 (H) 06/07/2019 0502   BUN 12 06/07/2019 0502   BUN 17 10/06/2017 1142   CREATININE 1.35 (H) 06/07/2019 0502   CREATININE 1.18 04/03/2016 0754   CALCIUM 8.7 (L) 06/07/2019 0502   PROT 6.0 (L) 06/07/2019 0502   PROT 7.3 10/06/2017 1142   ALBUMIN 2.5 (L) 06/07/2019 0502   ALBUMIN 4.4 10/06/2017 1142   AST 32 06/07/2019 0502   ALT 70 (H) 06/07/2019 0502   ALKPHOS 69 06/07/2019 0502   BILITOT 0.3 06/07/2019  0502   BILITOT 0.8 10/06/2017 1142   GFRNONAA 57 (L) 06/07/2019 0502   GFRAA >60 06/07/2019 0502   Lipase     Component Value Date/Time   LIPASE 44 06/01/2019 0417       Studies/Results: NM HEPATO BILIARY LEAK  Result Date: 06/06/2019 CLINICAL DATA:  Concern for biliary leak EXAM: NUCLEAR MEDICINE HEPATOBILIARY IMAGING TECHNIQUE: Sequential images of the abdomen were obtained out to 60 minutes following intravenous administration of radiopharmaceutical. RADIOPHARMACEUTICALS:  5.4 mCi Tc-74m  Choletec IV COMPARISON:  Preoperative CT of 05/30/2019 FINDINGS: Prompt uptake and biliary excretion of activity by the liver is seen. Gallbladder activity is visualized, consistent with patency of cystic duct. Biliary activity passes into small bowel, consistent with patent common bile duct. IMPRESSION: No signs of biliary leak.  Patent common bile duct. Electronically Signed   By: Donzetta Kohut M.D.   On: 06/06/2019 13:56    Anti-infectives: Anti-infectives (From admission, onward)   Start     Dose/Rate Route Frequency Ordered Stop   06/06/19 1300  amoxicillin-clavulanate (AUGMENTIN) 875-125 MG per tablet 1 tablet     1 tablet Oral Every 12 hours 06/06/19 0749 06/16/19 0959   05/31/19 0300  piperacillin-tazobactam (ZOSYN) IVPB 3.375 g  Status:  Discontinued     3.375 g 12.5 mL/hr over 240 Minutes Intravenous Every 8 hours 05/30/19 2242 06/06/19  7001   05/30/19 2030  piperacillin-tazobactam (ZOSYN) IVPB 3.375 g     3.375 g 100 mL/hr over 30 Minutes Intravenous  Once 05/30/19 2022 05/30/19 2316       Assessment/Plan LVH CKD Hypertension Hypothyroid Hyperlipidemia Atrial fibrillation with RVR- new -above per medicine  Acute cholecystitis - S/P laparoscopic partial cholecystectomy, Dr. Georgette Dover, 12/10POD#6 - JP drain is bilious - HIDA negative for leak - pain improved, he is tolerating a diet - stable for discharge from surgery standpoint, follow up in chart  [place] diet   ID: Zosyn 12/8-12/14 DVT: SCDs Follow-up: [facility]   LOS: 8 days    Brigid Re , [facility] 06/07/2019, 9:15 AM Please see Amion for pager number during day hours 7:00am-4:30pm

## 2019-06-07 NOTE — Progress Notes (Addendum)
Progress Note  Patient Name: Martin [name] Date of Encounter: 06/07/2019  Primary Cardiologist: Fransico Him, MD   Subjective   No chest pain or SOB.  Hopes to go home soon.  Inpatient Medications    Scheduled Meds:  acetaminophen  1,000 mg Oral Q8H   amoxicillin-clavulanate  1 tablet Oral Q12H   diltiazem  120 mg Oral Daily   losartan  25 mg Oral Daily   metoprolol tartrate  100 mg Oral BID   Continuous Infusions:  sodium chloride     sodium chloride 75 mL/hr at 06/05/19 1757   PRN Meds: sodium chloride, labetalol, morphine injection, ondansetron **OR** ondansetron (ZOFRAN) IV, oxyCODONE   Vital Signs    Vitals:   06/06/19 2000 06/07/19 0017 06/07/19 0330 06/07/19 0350  BP: (!) 173/106 (!) 189/121 (!) 185/129 (!) 160/101  Pulse: 84 68 73 79  Resp: 20 20 20    Temp: 98.9 F (37.2 C) 98.2 F (36.8 C) 98.3 F (36.8 C)   TempSrc: Oral Oral Oral   SpO2: 98% 98% 98% 98%  Weight:   124.1 kg   Height:        Intake/Output Summary (Last 24 hours) at 06/07/2019 0757 Last data filed at 06/07/2019 0606 Gross per 24 hour  Intake 820 ml  Output 1060 ml  Net -240 ml   Last 3 Weights 06/07/2019 06/06/2019 06/05/2019  Weight (lbs) 273 lb 9.6 oz 272 lb 9.6 oz 273 lb 4.8 oz  Weight (kg) 124.104 kg 123.651 kg 123.968 kg      Telemetry    SR with occ PVC - Personally Reviewed  ECG    SR with no acute changes from yesterday + T wave inversion III - Personally Reviewed  Physical Exam   GEN: No acute distress.   Neck: No JVD Cardiac: RRR, no murmurs, rubs, or gallops.  Respiratory: Clear to auscultation bilaterally. GI: Soft, nontender, non-distended  MS: No edema; No deformity. Neuro:  Nonfocal  Psych: Normal affect   Labs    High Sensitivity Troponin:   Recent Labs  Lab 05/31/19 0727 05/31/19 1314  TROPONINIHS 8 10      Chemistry Recent Labs  Lab 06/04/19 0444 06/05/19 0307 06/06/19 0540 06/07/19 0502  NA 144 144  --  142  K 4.3 4.3  --   3.9  CL 111 113*  --  110  CO2 26 23  --  25  GLUCOSE 112* 106*  --  103*  BUN 25* 21*  --  12  CREATININE 1.47* 1.22  --  1.35*  CALCIUM 8.4* 8.4*  --  8.7*  PROT 6.2* 6.0* 6.2* 6.0*  ALBUMIN 2.2* 2.3* 2.5* 2.5*  AST 24 38 38 32  ALT 51* 66* 69* 70*  ALKPHOS 79 78 69 69  BILITOT 0.7 0.7 0.6 0.3  GFRNONAA 51* >60  --  57*  GFRAA 59* >60  --  >60  ANIONGAP 7 8  --  7     Hematology Recent Labs  Lab 06/05/19 0307 06/05/19 0955 06/06/19 0540 06/07/19 0502  WBC 10.2  --  8.7 8.7  RBC 3.43*  --  3.45* 3.42*  HGB 10.5* 10.4* 10.4* 10.3*  HCT 33.0* 32.8* 33.4* 32.8*  MCV 96.2  --  96.8 95.9  MCH 30.6  --  30.1 30.1  MCHC 31.8  --  31.1 31.4  RDW 15.0  --  14.9 15.1  PLT 357  --  387 369    BNPNo results for input(s): BNP, PROBNP  in the last 168 hours.   DDimer No results for input(s): DDIMER in the last 168 hours.   Radiology    NM HEPATO BILIARY LEAK  Result Date: 06/06/2019 CLINICAL DATA:  Concern for biliary leak EXAM: NUCLEAR MEDICINE HEPATOBILIARY IMAGING TECHNIQUE: Sequential images of the abdomen were obtained out to 60 minutes following intravenous administration of radiopharmaceutical. RADIOPHARMACEUTICALS:  5.4 mCi Tc-73m  Choletec IV COMPARISON:  Preoperative CT of 05/30/2019 FINDINGS: Prompt uptake and biliary excretion of activity by the liver is seen. Gallbladder activity is visualized, consistent with patency of cystic duct. Biliary activity passes into small bowel, consistent with patent common bile duct. IMPRESSION: No signs of biliary leak.  Patent common bile duct. Electronically Signed   By: Donzetta Kohut M.D.   On: 06/06/2019 13:56    Cardiac Studies   05/31/19 Echo IMPRESSIONS      1. Left ventricular ejection fraction, by visual estimation, is 55 to 60%. The left ventricle has normal function. There is mildly increased left ventricular hypertrophy.  2. Left ventricular diastolic function could not be evaluated.  3. The left ventricle has no  regional wall motion abnormalities.  4. Global right ventricle has normal systolic function.The right ventricular size is normal. No increase in right ventricular wall thickness.  5. Left atrial size was normal.  6. Right atrial size was normal.  7. Presence of pericardial fat pad.  8. Mild mitral annular calcification.  9. The mitral valve is degenerative. Trivial mitral valve regurgitation. 10. The tricuspid valve is grossly normal. Tricuspid valve regurgitation is trivial. 11. The aortic valve is tricuspid. Aortic valve regurgitation is not visualized. 12. The pulmonic valve was grossly normal. Pulmonic valve regurgitation is not visualized. 13. Normal pulmonary artery systolic pressure. 14. The inferior vena cava is normal in size with greater than 50% respiratory variability, suggesting right atrial pressure of 3 mmHg. 15. No prior Echocardiogram.   FINDINGS  Left Ventricle: Left ventricular ejection fraction, by visual estimation, is 55 to 60%. The left ventricle has normal function. The left ventricle has no regional wall motion abnormalities. There is mildly increased left ventricular hypertrophy.  Concentric left ventricular hypertrophy. The left ventricular diastology could not be evaluated due to atrial fibrillation. Left ventricular diastolic function could not be evaluated.    Patient Profile     58 y.o. male with a hx of HTN, LVH, hypothyroidism and HLD admitted with abd paincholecystitis with possible perforation, abdominal US showed cholecystitis and new afib RVR.     Assessment & Plan    1. PAF:  Converted to NSR continue cardizem and lopressor no anticoagulation   EF normal 55-60% by TTE 05/31/19 with normal LA size [place] VASC 1 no anticoagulation  - follow up in 2 weeks with cards   2. RBBB:  Transient ECG this am resolved    3. GB:  Post lap choly per surgery JP drain still in site looks fine no leak on HIDA scan.   4.  HTN 189/121 and 160/101 on losartan, BB and  dilt.          For questions or updates, please contact [facility] Please consult [email or web address] for contact info under        Signed, Nada Boozer, NP  06/07/2019, 7:57 AM    Patient examined chart reviewed exam with lungs clear post lap choly no murmur Telemetry with NSR no need for anticoagulation self limited PAF and CHaDVASC 1  Charlton Haws MD [facility]

## 2019-06-07 NOTE — Progress Notes (Addendum)
Daily Rounding Note  06/07/2019, 9:20 AM  LOS: 8 days   SUBJECTIVE:   Chief complaint: ? Bile leak after sub-total colectomy.      Pt feels well.  No abd pain, nausea, malaise.  Brown, formed stool.   35 mL drainage to JP drain yesterday  OBJECTIVE:         Vital signs in last 24 hours:    Temp:  [97.6 F (36.4 C)-98.9 F (37.2 C)] 98.3 F (36.8 C) (12/16 0330) Pulse Rate:  [68-84] 79 (12/16 0350) Resp:  [20] 20 (12/16 0330) BP: (158-189)/(100-129) 160/101 (12/16 0350) SpO2:  [98 %-100 %] 98 % (12/16 0350) Weight:  [124.1 kg] 124.1 kg (12/16 0330) Last BM Date: 06/07/19 Filed Weights   06/05/19 0414 06/06/19 0407 06/07/19 0330  Weight: 124 kg 123.7 kg 124.1 kg   General: looks well, comfortable, alert   Heart: RRR Chest: clear bil.  No dyspnea or cough Abdomen: obese, soft, NT.  ~ 10 mL of brown/clear bile in JP bulb.   Extremities: no CCE Neuro/Psych:  Pleasant, calm, no gross deficits.    Intake/Output from previous day: 12/15 0701 - 12/16 0700 In: 820 [P.O.:820] Out: 1060 [Urine:1025; Drains:35]  Intake/Output this shift: No intake/output data recorded.  Lab Results: Recent Labs    06/05/19 0307 06/05/19 0955 06/06/19 0540 06/07/19 0502  WBC 10.2  --  8.7 8.7  HGB 10.5* 10.4* 10.4* 10.3*  HCT 33.0* 32.8* 33.4* 32.8*  PLT 357  --  387 369   BMET Recent Labs    06/05/19 0307 06/07/19 0502  NA 144 142  K 4.3 3.9  CL 113* 110  CO2 23 25  GLUCOSE 106* 103*  BUN 21* 12  CREATININE 1.22 1.35*  CALCIUM 8.4* 8.7*   LFT Recent Labs    06/05/19 0307 06/06/19 0540 06/07/19 0502  PROT 6.0* 6.2* 6.0*  ALBUMIN 2.3* 2.5* 2.5*  AST 38 38 32  ALT 66* 69* 70*  ALKPHOS 78 69 69  BILITOT 0.7 0.6 0.3  BILIDIR  --  <0.1  --   IBILI  --  NOT CALCULATED  --    PT/INR No results for input(s): LABPROT, INR in the last 72 hours. Hepatitis Panel No results for input(s): HEPBSAG, HCVAB, HEPAIGM,  HEPBIGM in the last 72 hours.  Studies/Results: NM HEPATO BILIARY LEAK  Result Date: 06/06/2019 CLINICAL DATA:  Concern for biliary leak EXAM: NUCLEAR MEDICINE HEPATOBILIARY IMAGING TECHNIQUE: Sequential images of the abdomen were obtained out to 60 minutes following intravenous administration of radiopharmaceutical. RADIOPHARMACEUTICALS:  5.4 mCi Tc-20m  Choletec IV COMPARISON:  Preoperative CT of 05/30/2019 FINDINGS: Prompt uptake and biliary excretion of activity by the liver is seen. Gallbladder activity is visualized, consistent with patency of cystic duct. Biliary activity passes into small bowel, consistent with patent common bile duct. IMPRESSION: No signs of biliary leak.  Patent common bile duct. Electronically Signed   By: Donzetta Kohut M.D.   On: 06/06/2019 13:56    ASSESMENT:   *   Bilious JP drainage. 7 days post sub-total cholecystectomy.  HIDA negative for bile leak, AST moderately elevated but o/w normal LFTs.   Bile fluid Amylase and bilirubin levels collected, pndg.    *    Limited blood per rectum, resolved with cessation of heparin.  Large external hemorrhoids on DRE are the likely source. Benign, unremarkable colonoscopy 04/2009.  Is now due for 10-year follow-up.  *   Transient atrial fibrillation  with rapid rate, resolved.  Heparin discontinued and patient did not require DCCV.  *    Hypertension.   *   AKI.   Mild.    *   [place] anemia.  Stable.   Due for repeat avg risk screening colonoscopy 04/2019, normal study in 04/2009.     PLAN   *   Awaiting results of fluid Amylase and bilirubin.  Continue HH diet    Azucena Freed  06/07/2019, 9:20 AM Phone [phone]

## 2019-06-07 NOTE — Discharge Summary (Signed)
Physician Discharge Summary  [facility] JYN:[id number] DOB: 05/31/1961 DOA: 05/30/2019  PCP: Ronnald Nian, MD  Admit date: 05/30/2019 Discharge date: 06/07/2019  Admitted From: Home Disposition: Home  Recommendations for Outpatient Follow-up:  1. Follow up with PCP in 1-2 weeks 2. Follow-up with surgery as a scheduled next week.  Home Health: Not applicable Equipment/Devices: Not applicable  Discharge Condition: Stable CODE STATUS: Full code Diet recommendation: Low-salt diet  Discharge summary: Patient with history of hypertension hyperlipidemia presented to the emergency room with abdominal pain and nausea of 2 days.  In the emergency room he was found with A. fib with RVR.  COVID-19 negative.  Acute renal failure.  LFTs normal.  CT scan abdomen shows possible cholecystitis with perforation.  Treated with empiric antibiotics and admitted to the hospital.  Acute cholecystitis: Treated with laparoscopic cholecystectomy with improvement.  There was suspicion of bile leak with persistent JP drain, however HIDA scan was negative.  Patient adequately improved.  Received 5 days of IV antibiotics. With adequate improvement, surgery recommended continue oral antibiotics.  Discharged home with JP drain with instructions and follow-up next week.  Transient A. fib: Spontaneously converted to normal sinus rhythm.  Seen by cardiology.  Recommended no anticoagulation with CHA2DS2-VASc 2 score of 1.  Acute kidney injury: Continue to improve and normalized.  Hypertension: Blood pressures are stable.  He was on different regimen to control his heart rate, going home with antihypertensives and Cardizem.  Patient adequately improved.  Able to go home today.  Discharge Diagnoses:  Principal Problem:   Abdominal pain Active Problems:   Hypertension   Hypothyroid   Acute cholecystitis   ARF (acute renal failure) (HCC)   Atrial fibrillation with RVR (HCC)   Acute lower GI bleeding    History of cholecystectomy    Discharge Instructions  Discharge Instructions    Call MD for:  persistant nausea and vomiting   Complete by: As directed    Call MD for:  redness, tenderness, or signs of infection (pain, swelling, redness, odor or green/yellow discharge around incision site)   Complete by: As directed    Call MD for:  severe uncontrolled pain   Complete by: As directed    Call MD for:  temperature >100.4   Complete by: As directed    Diet - low sodium heart healthy   Complete by: As directed    Increase activity slowly   Complete by: As directed      Allergies as of 06/07/2019      Reactions   Garlic Swelling   Shrimp [shellfish Allergy] Swelling      Medication List    STOP taking these medications   amLODipine 10 MG tablet Commonly known as: NORVASC   azithromycin 250 MG tablet Commonly known as: Zithromax Z-Pak   benzonatate 100 MG capsule Commonly known as: TESSALON   hydrochlorothiazide 25 MG tablet Commonly known as: HYDRODIURIL   losartan-hydrochlorothiazide 100-25 MG tablet Commonly known as: HYZAAR   metoprolol succinate 100 MG 24 hr tablet Commonly known as: TOPROL-XL   triamcinolone cream 0.5 % Commonly known as: KENALOG     TAKE these medications   amoxicillin-clavulanate 875-125 MG tablet Commonly known as: AUGMENTIN Take 1 tablet by mouth every 12 (twelve) hours for 9 days.   aspirin 81 MG tablet Take 81 mg by mouth daily.   cetirizine 10 MG tablet Commonly known as: ZyrTEC Allergy Take 1 tablet (10 mg total) by mouth daily. What changed:   when to take  this  reasons to take this   diltiazem 120 MG 24 hr capsule Commonly known as: CARDIZEM CD Take 1 capsule (120 mg total) by mouth daily. Start taking on: June 08, 2019   losartan 25 MG tablet Commonly known as: COZAAR Take 1 tablet (25 mg total) by mouth daily. Start taking on: June 08, 2019 What changed:   medication strength  how much to take    metoprolol tartrate 100 MG tablet Commonly known as: LOPRESSOR Take 1 tablet (100 mg total) by mouth 2 (two) times daily. What changed:   medication strength  how much to take   pravastatin 40 MG tablet Commonly known as: PRAVACHOL Take 1 tablet (40 mg total) by mouth daily.   sildenafil 20 MG tablet Commonly known as: REVATIO Take 1 to 5 pills as needed What changed:   how much to take  how to take this  when to take this  reasons to take this  additional instructions   traMADol 50 MG tablet Commonly known as: ULTRAM Take 50 mg by mouth every 6 (six) hours as needed for moderate pain.      Follow-up Information    Ronnald Nian, MD Follow up.   Specialty: Family Medicine Why: call and follow up with medical issues Contact information: [address] [place] [address] [phone]        Manus Rudd, MD Follow up on 06/30/2019.   Specialty: General Surgery Why: Your appointment is at 9:50 AM.  Be at the office 30 minutes early for check-in.  Bring photo ID and Insurance account manager information: [address] [place] [address] [phone]        Surgery, [place]. Go on 06/12/2019.   Specialty: General Surgery Why: visit with the nurse to look at the drain at 2 PM.  Record drainage from drain each day and the color of the drain, call if it changes color.  Be at the office 30 minutes early for check in.  Bring photo ID and insurance information. Contact information: [address] [place] [address] [phone]        Quintella Reichert, MD Follow up on 06/26/2019.   Specialty: Cardiology Why: at 11:30 AM with her nurse practitioner Georgie Chard.  Contact information: [address] [place] [address] [phone]          Allergies  Allergen Reactions  . Garlic Swelling  . Shrimp [Shellfish Allergy] Swelling    Consultations:  Gastroenterology  General  surgery   Procedures/Studies: CT ABDOMEN PELVIS WO CONTRAST  Result Date: 05/30/2019 CLINICAL DATA:  Acute renal failure EXAM: CT ABDOMEN AND PELVIS WITHOUT CONTRAST TECHNIQUE: Multidetector CT imaging of the abdomen and pelvis was performed following the standard protocol without IV contrast. COMPARISON:  None. FINDINGS: Lower chest: There is atelectasis at the lung bases.The heart size is mildly enlarged Hepatobiliary: There is decreased hepatic attenuation suggestive of hepatic steatosis. There is significant pericholecystic fat stranding and free fluid. The gallbladder is mildly distended. There is a 3.2 x 2.3 cm collection abutting the gallbladder fundus and adjacent:. This is best visualized on the coronal series. Pancreas: Normal contours without ductal dilatation. No peripancreatic fluid collection. Spleen: No splenic laceration or hematoma. Adrenals/Urinary Tract: --Adrenal glands: No adrenal hemorrhage. --Right kidney/ureter: No hydronephrosis or perinephric hematoma. --Left kidney/ureter: No hydronephrosis or perinephric hematoma. --Urinary bladder: Unremarkable. Stomach/Bowel: --Stomach/Duodenum: No hiatal hernia or other gastric abnormality. Normal duodenal course and caliber. --Small  bowel: No dilatation or inflammation. --Colon: No focal abnormality. --Appendix: Normal. Vascular/Lymphatic: Atherosclerotic calcification is present within the non-aneurysmal abdominal aorta, without hemodynamically significant stenosis. --No retroperitoneal lymphadenopathy. --No mesenteric lymphadenopathy. --No pelvic or inguinal lymphadenopathy. Reproductive: Unremarkable Other: No ascites or free air. There are bilateral fat containing inguinal hernias. Musculoskeletal. No acute displaced fractures. IMPRESSION: 1. Pericholecystic fat stranding and free fluid, consistent with acute cholecystitis in the appropriate clinical setting. There is a 3.2 x 2.3 cm collection abutting the gallbladder fundus and the colon,  raising concern for perforation. This is suboptimally evaluated in the absence of IV contrast. 2. Hepatic steatosis. 3. Bilateral fat containing inguinal hernias. 4. No hydronephrosis.  No radiopaque obstructing kidney stones. Aortic Atherosclerosis (ICD10-I70.0). Electronically Signed   By: Katherine Mantle M.D.   On: 05/30/2019 20:13   DG Chest Port 1 View  Result Date: 05/30/2019 CLINICAL DATA:  Shortness of breath. EXAM: PORTABLE CHEST 1 VIEW COMPARISON:  Oct 24, 2016 FINDINGS: Minimal atelectasis in the left base. No pneumothorax. The heart, hila, mediastinum, lungs, and pleura are normal. IMPRESSION: No acute abnormalities. Electronically Signed   By: Gerome Sam III M.D   On: 05/30/2019 14:41   ECHOCARDIOGRAM COMPLETE  Result Date: 05/31/2019   ECHOCARDIOGRAM REPORT   Patient Name:   Martin [name] Date of Exam: 05/31/2019 Medical Rec #:  [id number]         Height:       73.0 in Accession #:    [id number]        Weight:       270.0 lb Date of Birth:  [birth year]          BSA:          2.44 m Patient Age:    58 years          BP:           20/92 mmHg Patient Gender: M                 HR:           103 bpm. Exam Location:  Inpatient Procedure: 2D Echo Indications:    Atrial Fibrillation 427.31 / I48.91  History:        Patient has no prior history of Echocardiogram examinations.                 Arrythmias:Atrial Fibrillation; Risk Factors:Hypertension,                 Dyslipidemia and Non-Smoker. Acute Renal Failure, LVH.  Sonographer:    Jeryl Columbia Referring Phys: [id number] ARSHAD N KAKRAKANDY IMPRESSIONS  1. Left ventricular ejection fraction, by visual estimation, is 55 to 60%. The left ventricle has normal function. There is mildly increased left ventricular hypertrophy.  2. Left ventricular diastolic function could not be evaluated.  3. The left ventricle has no regional wall motion abnormalities.  4. Global right ventricle has normal systolic function.The right ventricular size is normal. No  increase in right ventricular wall thickness.  5. Left atrial size was normal.  6. Right atrial size was normal.  7. Presence of pericardial fat pad.  8. Mild mitral annular calcification.  9. The mitral valve is degenerative. Trivial mitral valve regurgitation. 10. The tricuspid valve is grossly normal. Tricuspid valve regurgitation is trivial. 11. The aortic valve is tricuspid. Aortic valve regurgitation is not visualized. 12. The pulmonic valve was grossly normal. Pulmonic valve regurgitation is not visualized. 13. Normal pulmonary artery systolic pressure. 14. The inferior  vena cava is normal in size with greater than 50% respiratory variability, suggesting right atrial pressure of 3 mmHg. 15. No prior Echocardiogram. FINDINGS  Left Ventricle: Left ventricular ejection fraction, by visual estimation, is 55 to 60%. The left ventricle has normal function. The left ventricle has no regional wall motion abnormalities. There is mildly increased left ventricular hypertrophy. Concentric left ventricular hypertrophy. The left ventricular diastology could not be evaluated due to atrial fibrillation. Left ventricular diastolic function could not be evaluated. Right Ventricle: The right ventricular size is normal. No increase in right ventricular wall thickness. Global RV systolic function is has normal systolic function. The tricuspid regurgitant velocity is 2.36 m/s, and with an assumed right atrial pressure  of 3 mmHg, the estimated right ventricular systolic pressure is normal at 25.3 mmHg. Left Atrium: Left atrial size was normal in size. Right Atrium: Right atrial size was normal in size Pericardium: There is no evidence of pericardial effusion. Presence of pericardial fat pad. Mitral Valve: The mitral valve is degenerative in appearance. Mild mitral annular calcification. Trivial mitral valve regurgitation. Tricuspid Valve: The tricuspid valve is grossly normal. Tricuspid valve regurgitation is trivial. Aortic  Valve: The aortic valve is tricuspid. Aortic valve regurgitation is not visualized. There is moderate calcification of the aortic valve. Pulmonic Valve: The pulmonic valve was grossly normal. Pulmonic valve regurgitation is not visualized. Pulmonic regurgitation is not visualized. Aorta: The aortic root and ascending aorta are structurally normal, with no evidence of dilitation. Venous: The inferior vena cava is normal in size with greater than 50% respiratory variability, suggesting right atrial pressure of 3 mmHg. IAS/Shunts: No atrial level shunt detected by color flow Doppler.  LEFT VENTRICLE PLAX 2D LVIDd:         5.20 cm  Diastology LVIDs:         3.50 cm  LV e' lateral:   12.10 cm/s LV PW:         1.60 cm  LV E/e' lateral: 4.2 LV IVS:        1.40 cm  LV e' medial:    10.60 cm/s LVOT diam:     2.20 cm  LV E/e' medial:  4.8 LV SV:         79 ml LV SV Index:   30.81 LVOT Area:     3.80 cm  RIGHT VENTRICLE TAPSE (M-mode): 1.9 cm LEFT ATRIUM             Index       RIGHT ATRIUM           Index LA diam:        3.70 cm 1.51 cm/m  RA Area:     11.50 cm LA Vol (A2C):   42.0 ml 17.18 ml/m RA Volume:   24.80 ml  10.15 ml/m LA Vol (A4C):   33.3 ml 13.62 ml/m LA Biplane Vol: 39.8 ml 16.28 ml/m   AORTA Ao Root diam: 3.00 cm MITRAL VALVE                        TRICUSPID VALVE MV Area (PHT): 2.93 cm             TR Peak grad:   22.3 mmHg MV PHT:        75.11 msec           TR Vmax:        236.00 cm/s MV Decel Time: 259 msec MV E velocity: 51.00 cm/s 103 cm/s  SHUNTS MV A velocity: 22.70 cm/s 70.3 cm/s Systemic Diam: 2.20 cm MV E/A ratio:  2.25       1.5  Eleonore Chiquito MD Electronically signed by Eleonore Chiquito MD Signature Date/Time: 05/31/2019/12:12:14 PM    Final    NM HEPATO BILIARY LEAK  Result Date: 06/06/2019 CLINICAL DATA:  Concern for biliary leak EXAM: NUCLEAR MEDICINE HEPATOBILIARY IMAGING TECHNIQUE: Sequential images of the abdomen were obtained out to 60 minutes following intravenous administration of  radiopharmaceutical. RADIOPHARMACEUTICALS:  5.4 mCi Tc-65m  Choletec IV COMPARISON:  Preoperative CT of 05/30/2019 FINDINGS: Prompt uptake and biliary excretion of activity by the liver is seen. Gallbladder activity is visualized, consistent with patency of cystic duct. Biliary activity passes into small bowel, consistent with patent common bile duct. IMPRESSION: No signs of biliary leak.  Patent common bile duct. Electronically Signed   By: Zetta Bills M.D.   On: 06/06/2019 13:56   US Abdomen Limited RUQ  Result Date: 05/30/2019 CLINICAL DATA:  Pain. Additional history provided by scanning technologist: Right upper quadrant pain for 2 days. EXAM: ULTRASOUND ABDOMEN LIMITED RIGHT UPPER QUADRANT COMPARISON:  CT abdomen/pelvis performed earlier the same day 05/30/2019 FINDINGS: Gallbladder: Cholecystolithiasis. Gallbladder wall thickening to 5 mm with pericholecystic fluid. Additionally, a sonographic Percell Miller sign is elicited by the scanning technologist. Common bile duct: Diameter: The visualized common duct is dilated to 8 mm in diameter. Liver: No focal lesion identified. Increased hepatic parenchymal echogenicity. Interrogated portal vein is patent on color Doppler imaging with normal direction of blood flow towards the liver. IMPRESSION: Cholecystolithiasis with gallbladder wall thickening and pericholecystic fluid. Additionally, a sonographic Percell Miller sign is elicited by the scanning technologist. Findings are consistent with acute cholecystitis in the appropriate clinical setting. Extrahepatic biliary ductal dilatation with the visualized common duct measuring 8 mm in diameter. Increased hepatic parenchymal echogenicity. This finding is nonspecific, but most commonly seen in the setting of hepatic steatosis. Electronically Signed   By: Kellie Simmering DO   On: 05/30/2019 22:29     Subjective: Patient seen and examined.  No overnight events.  Denies any chest pain shortness of breath.  No abdominal pain.   Eating well.  Normal bowel movements.  Afebrile. JP drain 35 mL 24 hours.  HIDA scan negative.  Eager to go home.   Discharge Exam: Vitals:   06/07/19 0350 06/07/19 0936  BP: (!) 160/101 (!) 167/110  Pulse: 79   Resp:    Temp:    SpO2: 98%    Vitals:   06/07/19 0017 06/07/19 0330 06/07/19 0350 06/07/19 0936  BP: (!) 189/121 (!) 185/129 (!) 160/101 (!) 167/110  Pulse: 68 73 79   Resp: 20 20    Temp: 98.2 F (36.8 C) 98.3 F (36.8 C)    TempSrc: Oral Oral    SpO2: 98% 98% 98%   Weight:  124.1 kg    Height:        General: Pt is alert, awake, not in acute distress Cardiovascular: RRR, S1/S2 +, no rubs, no gallops Respiratory: CTA bilaterally, no wheezing, no rhonchi Abdominal: Soft, NT, ND, bowel sounds +, right lateral quadrant JP drain present with minimal biliary drain. Extremities: no edema, no cyanosis    The results of significant diagnostics from this hospitalization (including imaging, microbiology, ancillary and laboratory) are listed below for reference.     Microbiology: Recent Results (from the past 240 hour(s))  Novel Coronavirus, NAA ([facility])     Status: None   Collection Time: 05/29/19 12:00 AM  Specimen: Nasopharyngeal(NP) swabs in vial transport medium   NASOPHARYNGE  TESTING  Result Value Ref Range Status   SARS-CoV-2, NAA Not Detected Not Detected Final    Comment: This nucleic acid amplification test was developed and its performance characteristics determined by [employer]. Nucleic acid amplification tests include PCR and TMA. This test has not been FDA cleared or approved. This test has been authorized by FDA under an Emergency Use Authorization (EUA). This test is only authorized for the duration of time the declaration that circumstances exist justifying the authorization of the emergency use of in vitro diagnostic tests for detection of SARS-CoV-2 virus and/or diagnosis of COVID-19 infection under section 564(b)(1) of the Act, 21  U.S.C. 811BJY-7(W) (1), unless the authorization is terminated or revoked sooner. When diagnostic testing is negative, the possibility of a false negative result should be considered in the context of a patient's recent exposures and the presence of clinical signs and symptoms consistent with COVID-19. An individual without symptoms of COVID-19 and who is not shedding SARS-CoV-2 virus would  expect to have a negative (not detected) result in this assay.   SARS CORONAVIRUS 2 (TAT 6-24 HRS) Nasopharyngeal Nasopharyngeal Swab     Status: None   Collection Time: 05/30/19  3:50 PM   Specimen: Nasopharyngeal Swab  Result Value Ref Range Status   SARS Coronavirus 2 NEGATIVE NEGATIVE Final    Comment: (NOTE) SARS-CoV-2 target nucleic acids are NOT DETECTED. The SARS-CoV-2 RNA is generally detectable in upper and lower respiratory specimens during the acute phase of infection. Negative results do not preclude SARS-CoV-2 infection, do not rule out co-infections with other pathogens, and should not be used as the sole basis for treatment or other patient management decisions. Negative results must be combined with clinical observations, patient history, and epidemiological information. The expected result is Negative. Fact Sheet for Patients: HairSlick.no Fact Sheet for Healthcare Providers: quierodirigir.com This test is not yet approved or cleared by the [place] FDA and  has been authorized for detection and/or diagnosis of SARS-CoV-2 by FDA under an Emergency Use Authorization (EUA). This EUA will remain  in effect (meaning this test can be used) for the duration of the COVID-19 declaration under Section 56 4(b)(1) of the Act, 21 U.S.C. section 360bbb-3(b)(1), unless the authorization is terminated or revoked sooner. Performed at [facility], [address], [place], [place] [address]   Surgical PCR screen     Status:  None   Collection Time: 05/31/19 10:13 PM   Specimen: Nasal Mucosa; Nasal Swab  Result Value Ref Range Status   MRSA, PCR NEGATIVE NEGATIVE Final   Staphylococcus aureus NEGATIVE NEGATIVE Final    Comment: (NOTE) The Xpert SA Assay (FDA approved for NASAL specimens in patients 40 years of age and older), is one component of a comprehensive surveillance program. It is not intended to diagnose infection nor to guide or monitor treatment. Performed at [facility], [address], [place], [place] [address]      Labs: BNP (last 3 results) Recent Labs    05/30/19 1600  BNP 75.9   Basic Metabolic Panel: Recent Labs  Lab 06/01/19 0417 06/02/19 0406 06/03/19 0402 06/04/19 0444 06/05/19 0307 06/07/19 0502  NA 143 143 145 144 144 142  K 3.9 4.2 4.2 4.3 4.3 3.9  CL 106 107 109 111 113* 110  CO2 GLUCOSE 133* 118* 121* 112* 106* 103*  BUN 52* 36* 28* 25* 21*  12  CREATININE 1.72* 1.62* 1.42* 1.47* 1.22 1.35*  CALCIUM 8.6* 8.4* 8.5* 8.4* 8.4* 8.7*  MG 2.8*  --   --   --   --   --    Liver Function Tests: Recent Labs  Lab 06/03/19 0402 06/04/19 0444 06/05/19 0307 06/06/19 0540 06/07/19 0502  AST 26 24 38 38 32  ALT 56* 51* 66* 69* 70*  ALKPHOS 73 79 78 69 69  BILITOT 0.8 0.7 0.7 0.6 0.3  PROT 6.4* 6.2* 6.0* 6.2* 6.0*  ALBUMIN 2.3* 2.2* 2.3* 2.5* 2.5*   Recent Labs  Lab 06/01/19 0417  LIPASE 44   No results for input(s): AMMONIA in the last 168 hours. CBC: Recent Labs  Lab 06/01/19 0417 06/03/19 0402 06/04/19 0444 06/05/19 0307 06/05/19 0955 06/06/19 0540 06/07/19 0502  WBC 10.7* 9.8 10.3 10.2  --  8.7 8.7  NEUTROABS 7.8*  --   --   --   --   --   --   HGB 12.0* 11.8* 11.1* 10.5* 10.4* 10.4* 10.3*  HCT 36.7* 36.9* 34.5* 33.0* 32.8* 33.4* 32.8*  MCV 92.9 95.1 94.8 96.2  --  96.8 95.9  PLT 298 298 328 357  --  387 369   Cardiac Enzymes: No results for input(s): CKTOTAL, CKMB, CKMBINDEX, TROPONINI in the last 168  hours. BNP: Invalid input(s): POCBNP CBG: Recent Labs  Lab 05/31/19 2130 06/01/19 0603  GLUCAP 136* 120*   D-Dimer No results for input(s): DDIMER in the last 72 hours. Hgb A1c No results for input(s): HGBA1C in the last 72 hours. Lipid Profile No results for input(s): CHOL, HDL, LDLCALC, TRIG, CHOLHDL, LDLDIRECT in the last 72 hours. Thyroid function studies No results for input(s): TSH, T4TOTAL, T3FREE, THYROIDAB in the last 72 hours.  Invalid input(s): FREET3 Anemia work up No results for input(s): VITAMINB12, FOLATE, FERRITIN, TIBC, IRON, RETICCTPCT in the last 72 hours. Urinalysis    Component Value Date/Time   COLORURINE AMBER (A) 05/30/2019 1239   APPEARANCEUR HAZY (A) 05/30/2019 1239   LABSPEC 1.021 05/30/2019 1239   PHURINE 5.0 05/30/2019 1239   GLUCOSEU NEGATIVE 05/30/2019 1239   HGBUR NEGATIVE 05/30/2019 1239   BILIRUBINUR NEGATIVE 05/30/2019 1239   BILIRUBINUR n 04/03/2016 0856   KETONESUR NEGATIVE 05/30/2019 1239   PROTEINUR 30 (A) 05/30/2019 1239   UROBILINOGEN negative 04/03/2016 0856   NITRITE NEGATIVE 05/30/2019 1239   LEUKOCYTESUR NEGATIVE 05/30/2019 1239   Sepsis Labs Invalid input(s): PROCALCITONIN,  WBC,  LACTICIDVEN Microbiology Recent Results (from the past 240 hour(s))  Novel Coronavirus, NAA ([facility])     Status: None   Collection Time: 05/29/19 12:00 AM   Specimen: Nasopharyngeal(NP) swabs in vial transport medium   NASOPHARYNGE  TESTING  Result Value Ref Range Status   SARS-CoV-2, NAA Not Detected Not Detected Final    Comment: This nucleic acid amplification test was developed and its performance characteristics determined by [employer]. Nucleic acid amplification tests include PCR and TMA. This test has not been FDA cleared or approved. This test has been authorized by FDA under an Emergency Use Authorization (EUA). This test is only authorized for the duration of time the declaration that circumstances exist justifying the  authorization of the emergency use of in vitro diagnostic tests for detection of SARS-CoV-2 virus and/or diagnosis of COVID-19 infection under section 564(b)(1) of the Act, 21 U.S.C. 161WRU-0(A) (1), unless the authorization is terminated or revoked sooner. When diagnostic testing is negative, the possibility of a false negative result should be considered in  the context of a patient's recent exposures and the presence of clinical signs and symptoms consistent with COVID-19. An individual without symptoms of COVID-19 and who is not shedding SARS-CoV-2 virus would  expect to have a negative (not detected) result in this assay.   SARS CORONAVIRUS 2 (TAT 6-24 HRS) Nasopharyngeal Nasopharyngeal Swab     Status: None   Collection Time: 05/30/19  3:50 PM   Specimen: Nasopharyngeal Swab  Result Value Ref Range Status   SARS Coronavirus 2 NEGATIVE NEGATIVE Final    Comment: (NOTE) SARS-CoV-2 target nucleic acids are NOT DETECTED. The SARS-CoV-2 RNA is generally detectable in upper and lower respiratory specimens during the acute phase of infection. Negative results do not preclude SARS-CoV-2 infection, do not rule out co-infections with other pathogens, and should not be used as the sole basis for treatment or other patient management decisions. Negative results must be combined with clinical observations, patient history, and epidemiological information. The expected result is Negative. Fact Sheet for Patients: HairSlick.no Fact Sheet for Healthcare Providers: quierodirigir.com This test is not yet approved or cleared by the [place] FDA and  has been authorized for detection and/or diagnosis of SARS-CoV-2 by FDA under an Emergency Use Authorization (EUA). This EUA will remain  in effect (meaning this test can be used) for the duration of the COVID-19 declaration under Section 56 4(b)(1) of the Act, 21 U.S.C. section  360bbb-3(b)(1), unless the authorization is terminated or revoked sooner. Performed at [facility], [address] [place], [place] [address]   Surgical PCR screen     Status: None   Collection Time: 05/31/19 10:13 PM   Specimen: Nasal Mucosa; Nasal Swab  Result Value Ref Range Status   MRSA, PCR NEGATIVE NEGATIVE Final   Staphylococcus aureus NEGATIVE NEGATIVE Final    Comment: (NOTE) The Xpert SA Assay (FDA approved for NASAL specimens in patients 50 years of age and older), is one component of a comprehensive surveillance program. It is not intended to diagnose infection nor to guide or monitor treatment. Performed at [facility], [address] [place], [place] [address]      Time coordinating discharge: 35 minutes  SIGNED:   Dorcas Carrow, MD  [facility] 06/07/2019, 10:02 AM

## 2019-06-14 ENCOUNTER — Ambulatory Visit: Payer: BC Managed Care – PPO | Admitting: Family Medicine

## 2019-06-14 ENCOUNTER — Other Ambulatory Visit: Payer: Self-pay

## 2019-06-14 ENCOUNTER — Encounter: Payer: Self-pay | Admitting: Family Medicine

## 2019-06-14 VITALS — BP 162/110 | HR 61 | Temp 97.7°F | Wt 270.2 lb

## 2019-06-14 DIAGNOSIS — I10 Essential (primary) hypertension: Secondary | ICD-10-CM | POA: Diagnosis not present

## 2019-06-14 DIAGNOSIS — Z8679 Personal history of other diseases of the circulatory system: Secondary | ICD-10-CM

## 2019-06-14 DIAGNOSIS — Z23 Encounter for immunization: Secondary | ICD-10-CM

## 2019-06-14 DIAGNOSIS — Z9049 Acquired absence of other specified parts of digestive tract: Secondary | ICD-10-CM | POA: Diagnosis not present

## 2019-06-14 DIAGNOSIS — N179 Acute kidney failure, unspecified: Secondary | ICD-10-CM | POA: Diagnosis not present

## 2019-06-14 LAB — TOTAL BILIRUBIN, BODY FLUID: Total bilirubin, fluid: 10.5 mg/dL

## 2019-06-14 MED ORDER — LOSARTAN POTASSIUM-HCTZ 100-12.5 MG PO TABS: 1.0000 | ORAL_TABLET | Freq: Every day | ORAL | 3 refills | Status: DC

## 2019-06-14 NOTE — Progress Notes (Signed)
   Subjective:    [name], male    DOB: Nov 10, 1960, 58 y.o.   MRN: [id number]  HPI He is here for a follow-up visit after recent hospitalization and treatment for acute cholecystitis and was also found to be in atrial fibrillation as well as ARF.  He had a laparoscopic cholecystectomy and also had a drain put in.  He did have a leak from his cholecystitis.  The drain was recently removed.  He does complain of some slight abdominal discomfort.  His medications were reviewed.  They have been changed since his last visit to my office.  He is now on metoprolol, losartan and Cardizem.  He has no other concerns or complaints.  Last EKG in the hospital did show regular sinus rhythm.   Review of Systems     Objective:   Physical Exam Alert and in no distress.  Laparoscopic scars are noted.  Abdominal exam shows minimal tenderness and no rebound.  Cardiac exam shows a regular rhythm.  Lungs are clear to auscultation.       Assessment & Plan:  S/P laparoscopic cholecystectomy  Need for shingles vaccine - Plan: Varicella-zoster vaccine IM (Shingrix)  Need for Tdap vaccination - Plan: Tdap vaccine greater than or equal to 7yo IM  Essential hypertension - Plan: CBC with Differential, Comprehensive metabolic panel, losartan-hydrochlorothiazide (HYZAAR) 100-12.5 MG tablet  Acute renal failure, unspecified acute renal failure type ([place]) - Plan: Comprehensive metabolic panel  History of atrial fibrillation His immunizations were updated.  He is no longer in atrial fib.  I will put him back on his previous dose of losartan since his blood pressure is not adequately controlled.  Return here in 1 month for recheck on his blood pressure and 2 months for second shingles vaccine.

## 2019-06-15 LAB — COMPREHENSIVE METABOLIC PANEL
ALT: 34 IU/L (ref 0–44)
AST: 13 IU/L (ref 0–40)
Albumin/Globulin Ratio: 1.3 (ref 1.2–2.2)
Albumin: 4 g/dL (ref 3.8–4.9)
Alkaline Phosphatase: 83 IU/L (ref 39–117)
BUN/Creatinine Ratio: 12 (ref 10–24)
BUN: 14 mg/dL (ref 8–27)
Bilirubin Total: 0.6 mg/dL (ref 0.0–1.2)
CO2: 26 mmol/L (ref 20–29)
Calcium: 9.6 mg/dL (ref 8.6–10.2)
Chloride: 106 mmol/L (ref 96–106)
Creatinine, Ser: 1.19 mg/dL (ref 0.76–1.27)
GFR calc Af Amer: 76 mL/min/{1.73_m2} (ref >59)
GFR calc non Af Amer: 66 mL/min/{1.73_m2} (ref >59)
Globulin, Total: 3 g/dL (ref 1.5–4.5)
Glucose: 87 mg/dL (ref 65–99)
Potassium: 4.9 mmol/L (ref 3.5–5.2)
Sodium: 142 mmol/L (ref 134–144)
Total Protein: 7 g/dL (ref 6.0–8.5)

## 2019-06-15 LAB — CBC WITH DIFFERENTIAL/PLATELET
Basophils Absolute: 0.1 10*3/uL (ref 0.0–0.2)
Basos: 2 %
EOS (ABSOLUTE): 0.3 10*3/uL (ref 0.0–0.4)
Eos: 4 %
Hematocrit: 36.8 % — ABNORMAL LOW (ref 37.5–51.0)
Hemoglobin: 11.6 g/dL — ABNORMAL LOW (ref 13.0–17.7)
Immature Grans (Abs): 0 10*3/uL (ref 0.0–0.1)
Immature Granulocytes: 0 %
Lymphocytes Absolute: 1.8 10*3/uL (ref 0.7–3.1)
Lymphs: 23 %
MCH: 30.2 pg (ref 26.6–33.0)
MCHC: 31.5 g/dL (ref 31.5–35.7)
MCV: 96 fL (ref 79–97)
Monocytes Absolute: 0.8 10*3/uL (ref 0.1–0.9)
Monocytes: 10 %
Neutrophils Absolute: 4.9 10*3/uL (ref 1.4–7.0)
Neutrophils: 61 %
Platelets: 411 10*3/uL (ref 150–450)
RBC: 3.84 x10E6/uL — ABNORMAL LOW (ref 4.14–5.80)
RDW: 13.9 % (ref 11.6–15.4)
WBC: 7.9 10*3/uL (ref 3.4–10.8)

## 2019-06-18 NOTE — Progress Notes (Signed)
Cardiology Office Note   Date:  06/26/2019   ID:  Martin [name], DOB 10-03-1960, MRN [id number]  PCP:  Martin Lung, MD  Cardiologist:  Dr. Fransico Him, MD  Chief Complaint  Patient presents with  . Follow-up    History of Present Illness: Martin [name] is a 58 y.o. male who presents for hospital follow-up, seen for Dr. Radford [name].  [name] has no hx of CAD but hx of HTN, HLD, hypothyroidism who was seen remotely by Dr. Percival [name] for HTN.  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  He has never had any chest pain or pressure and no SOB.  He has never smoked and no family hx of CAD.    He was in his USOH and developed N/V and RUQ pain.  Presented to ER and found to be in afib with RVR.  He was dx with acute cholecystitis with possible perforation with plans for cholecystectomy. Cardiology was asked to consult for help with afib with RVR and preop clearance with suspicion that AF was triggered by acute illness. Patient was initially placed on IV heparin in anticipation for surgery. Cardiology planned for Eliquis 5mg  BID postoperatively when ok with surgery.  Initial plan was for outpatient TEE cardioversion however given difficult rate control he was scheduled for inpatient procedure.  Fortunately prior to cardioversion, patient converted to NSR on his own with diltiazem and metoprolol therapies.  Given low CHA2DS2-VASc score of 1, no further AC was prescribed.  Plans were for close follow-up for repeat EKG and assessment of symptoms.  Additional hospital issues include active GI bleeding>>no AC.   He presents today for follow-up and is doing well from a CV standpoint.  He denies recurrent palpitations, no shortness of breath, chest pain, LE swelling, PND, dizziness or syncope.  He is asking about returning to work which we have discussed this will need to come from his surgery team.  He is in normal sinus rhythm today per auscultation.  Is tolerating diltiazem well.   BP is elevated however was recently seen by his PCP at which time Hyzaar 100-12.5 was initiated with close follow-up.  Past Medical History:  Diagnosis Date  . Cardiomegaly    LVH  . Cataract   . Hemorrhoids   . Hyperlipidemia   . Hypertension   . Hypothyroidism   . Lower GI bleeding     Past Surgical History:  Procedure Laterality Date  . CHOLECYSTECTOMY N/A 06/01/2019   Procedure: LAPAROSCOPIC CHOLECYSTECTOMY;  Surgeon: Martin Mesa, MD;  Location: [place];  Service: General;  Laterality: N/A;  . DOPPLER ECHOCARDIOGRAPHY       Current Outpatient Medications  Medication Sig Dispense Refill  . aspirin 81 MG tablet Take 81 mg by mouth daily.      . cetirizine (ZYRTEC ALLERGY) 10 MG tablet Take 1 tablet (10 mg total) by mouth daily. 30 tablet 0  . diltiazem (CARDIZEM CD) 120 MG 24 hr capsule Take 1 capsule (120 mg total) by mouth daily. 30 capsule 0  . losartan-hydrochlorothiazide (HYZAAR) 100-12.5 MG tablet Take 1 tablet by mouth daily. 90 tablet 3  . metoprolol tartrate (LOPRESSOR) 100 MG tablet Take 1 tablet (100 mg total) by mouth 2 (two) times daily. 60 tablet 0  . pravastatin (PRAVACHOL) 40 MG tablet Take 1 tablet (40 mg total) by mouth daily. 90 tablet 3  . sildenafil (REVATIO) 20 MG tablet Take 1 to 5 pills as needed 20 tablet 5  .  traMADol (ULTRAM) 50 MG tablet Take 50 mg by mouth every 6 (six) hours as needed for moderate pain.     No current facility-administered medications for this visit.    Allergies:   Garlic and Shrimp [shellfish allergy]    Social History:  The patient  reports that he has never smoked. He has never used smokeless tobacco. He reports current alcohol use. He reports that he does not use drugs.   Family History:  The patient's family history includes Asthma in his father; Hypertension in his father; Kidney disease in his father and mother.    ROS:  Please see the history of present illness. Otherwise, review of systems are positive for none.    All other systems are reviewed and negative.    PHYSICAL EXAM: VS:  BP (!) 152/90   Pulse 60   Ht 6' 1.5" (1.867 m)   Wt 265 lb 12.8 oz (120.6 kg)   SpO2 98%   BMI 34.59 kg/m  , BMI Body mass index is 34.59 kg/m.   General: Well developed, well nourished, NAD Neck: Negative for carotid bruits. No JVD Lungs:Clear to ausculation bilaterally. No wheezes, rales, or rhonchi. Breathing is unlabored. Cardiovascular: RRR with S1 S2. No murmur Extremities: No edema.  DP/PT pulses 2+ bilaterally Neuro: Alert and oriented. No focal deficits. No facial asymmetry. MAE spontaneously. Psych: Responds to questions appropriately with normal affect.    EKG:  EK G is not ordered today.  Recent Labs: 05/30/2019: B Natriuretic Peptide 75.9 05/31/2019: TSH 2.498 06/01/2019: Magnesium 2.8 06/14/2019: ALT 34; BUN 14; Creatinine, Ser 1.19; Hemoglobin 11.6; Platelets 411; Potassium 4.9; Sodium 142    Lipid Panel    Component Value Date/Time   CHOL 138 10/06/2017 1142   TRIG 85 10/06/2017 1142   HDL 47 10/06/2017 1142   CHOLHDL 2.9 10/06/2017 1142   CHOLHDL 3.0 04/03/2016 0754   VLDL 17 04/03/2016 0754   LDLCALC 74 10/06/2017 1142      Wt Readings from Last 3 Encounters:  06/26/19 265 lb 12.8 oz (120.6 kg)  06/14/19 270 lb 3.2 oz (122.6 kg)  06/07/19 273 lb 9.6 oz (124.1 kg)      Other studies Reviewed: Additional studies/ records that were reviewed today include:   2D echo 05/31/2019 IMPRESSIONS   1. Left ventricular ejection fraction, by visual estimation, is 55 to 60%. The left ventricle has normal function. There is mildly increased left ventricular hypertrophy. 2. Left ventricular diastolic function could not be evaluated. 3. The left ventricle has no regional wall motion abnormalities. 4. Global right ventricle has normal systolic function.The right ventricular size is normal. No increase in right ventricular wall thickness. 5. Left atrial size was normal. 6. Right  atrial size was normal. 7. Presence of pericardial fat pad. 8. Mild mitral annular calcification. 9. The mitral valve is degenerative. Trivial mitral valve regurgitation. 10. The tricuspid valve is grossly normal. Tricuspid valve regurgitation is trivial. 11. The aortic valve is tricuspid. Aortic valve regurgitation is not visualized. 12. The pulmonic valve was grossly normal. Pulmonic valve regurgitation is not visualized. 13. Normal pulmonary artery systolic pressure. 14. The inferior vena cava is normal in size with greater than 50% respiratory variability, suggesting right atrial pressure of 3 mmHg. 15. No prior Echocardiogram.   ASSESSMENT AND PLAN:  1.  New onset atrial fibrillation with RVR:  -Felt to be in the setting of acute cholecystitis with initial plan for inpatient TEE/DCCV however patient converted to NSR with p.o. diltiazem and beta-blocker  therapies -Continue metoprolol 100 mg twice daily, diltiazem 120 mg -Denies recurrent palpitations -NSR on exam today -No ACE in the setting of GI bleed and low CHA2DS2-VASc (1) score  2.   Acute Cholecystitis: -Recent hospital course with acute cholecystitis treated with laparoscopic cholecystectomy with improvement. Received 5 days of IV antibiotics. -Has follow-up with surgical team in 2 days -Healing well, mild abdominal pain (surgical incision)  3. HTN: -Elevated, 152/90 -Continue Hyzaar 100/12.5, metoprolol tartrate 100 mg, diltiazem 120 mg -Follows closely with PCP   Current medicines are reviewed at length with the patient today.  The patient does not have concerns regarding medicines.  The following changes have been made:  no change  Labs/ tests ordered today include: None  No orders of the defined types were placed in this encounter.    Disposition:   FU with Dr. Mayford Knifeurner in 3 months  Signed, Georgie ChardJill Trevia Nop, NP  06/26/2019 12:13 PM    [facility] [address], [place], [place]   [id number] Phone: [phone]; Fax: [phone]

## 2019-06-26 ENCOUNTER — Encounter: Payer: Self-pay | Admitting: Cardiology

## 2019-06-26 ENCOUNTER — Other Ambulatory Visit: Payer: Self-pay

## 2019-06-26 ENCOUNTER — Ambulatory Visit (INDEPENDENT_AMBULATORY_CARE_PROVIDER_SITE_OTHER): Payer: BC Managed Care – PPO | Admitting: Cardiology

## 2019-06-26 VITALS — BP 152/90 | HR 60 | Ht 73.5 in | Wt 265.8 lb

## 2019-06-26 DIAGNOSIS — I10 Essential (primary) hypertension: Secondary | ICD-10-CM

## 2019-06-26 DIAGNOSIS — E785 Hyperlipidemia, unspecified: Secondary | ICD-10-CM

## 2019-06-26 DIAGNOSIS — I4891 Unspecified atrial fibrillation: Secondary | ICD-10-CM | POA: Diagnosis not present

## 2019-06-26 NOTE — Patient Instructions (Addendum)
Medication Instructions:   Your physician recommends that you continue on your current medications as directed. Please refer to the Current Medication list given to you today.  *If you need a refill on your cardiac medications before your next appointment, please call your pharmacy*  Lab Work:  None ordered today  Testing/Procedures:  None ordered today  Follow-Up: At [facility], you and your health needs are our priority.  As part of our continuing mission to provide you with exceptional heart care, we have created designated Provider Care Teams.  These Care Teams include your primary Cardiologist (physician) and Advanced Practice Providers (APPs -  Physician Assistants and Nurse Practitioners) who all work together to provide you with the care you need, when you need it.  Your next appointment:    On 08/29/19(Tuesday) at 8:00AM with Armanda Magic, MD

## 2019-07-04 ENCOUNTER — Telehealth: Payer: Self-pay | Admitting: Internal Medicine

## 2019-07-04 DIAGNOSIS — I10 Essential (primary) hypertension: Secondary | ICD-10-CM

## 2019-07-04 MED ORDER — DILTIAZEM HCL ER COATED BEADS 120 MG PO CP24: 120.0000 mg | ORAL_CAPSULE | Freq: Every day | ORAL | 0 refills | Status: DC

## 2019-07-04 MED ORDER — METOPROLOL TARTRATE 100 MG PO TABS: 100.0000 mg | ORAL_TABLET | Freq: Two times a day (BID) | ORAL | 0 refills | Status: DC

## 2019-07-04 NOTE — Telephone Encounter (Signed)
Pt called for a refill

## 2019-08-14 ENCOUNTER — Ambulatory Visit: Payer: BC Managed Care – PPO | Admitting: Family Medicine

## 2019-08-14 ENCOUNTER — Encounter: Payer: Self-pay | Admitting: Family Medicine

## 2019-08-14 ENCOUNTER — Other Ambulatory Visit: Payer: Self-pay

## 2019-08-14 VITALS — BP 162/110 | HR 59 | Temp 96.4°F | Wt 268.6 lb

## 2019-08-14 DIAGNOSIS — Z8679 Personal history of other diseases of the circulatory system: Secondary | ICD-10-CM

## 2019-08-14 DIAGNOSIS — I10 Essential (primary) hypertension: Secondary | ICD-10-CM

## 2019-08-14 MED ORDER — DILTIAZEM HCL ER COATED BEADS 180 MG PO CP24: 180.0000 mg | ORAL_CAPSULE | Freq: Every day | ORAL | 5 refills | Status: DC

## 2019-08-14 NOTE — Progress Notes (Signed)
   Subjective:    [name], male    DOB: August 27, 1960, 59 y.o.   MRN: [id number]  HPI he is here for blood pressure recheck. He was seen in January by cardiology. He did have a history of atrial fibrillation but has now reverted back to regular rhythm. He continues on medications listed in the chart and is having no difficulty with them.   Review of Systems     Objective:   Physical Exam alert and in no distress. Blood pressure is recorded. Cardiac exam shows a regular rhythm.     Assessment & Plan:  Essential hypertension - Plan: diltiazem (CARDIZEM CD) 180 MG 24 hr capsule  History of atrial fibrillation I will increase his Cardizem to 180 mg. Recommend that he postpone his cardiology visit till the end of March and have them check his pressure at that time. Otherwise I will see him in 6 months.

## 2019-08-14 NOTE — Patient Instructions (Signed)
Change your appointment with cardiology to the end of March throw away the Cardizem (verapamil) 120 mg and start taking the 180 mg

## 2019-08-19 ENCOUNTER — Ambulatory Visit: Payer: BC Managed Care – PPO

## 2019-08-27 ENCOUNTER — Ambulatory Visit: Payer: BC Managed Care – PPO

## 2019-08-28 ENCOUNTER — Encounter: Payer: Self-pay | Admitting: Cardiology

## 2019-08-28 DIAGNOSIS — I480 Paroxysmal atrial fibrillation: Secondary | ICD-10-CM | POA: Insufficient documentation

## 2019-08-28 NOTE — Progress Notes (Signed)
Cardiology Office Note:    Date:  08/29/2019   ID:  Martin [name], DOB February 21, 1961, MRN [id number]  PCP:  [name], MD  Cardiologist:  Fransico Him, MD    Referring MD: [name], MD   Chief Complaint  Patient presents with  . Atrial Fibrillation  . Hypertension    History of Present Illness:    Martin [name] is a 59 y.o. male with a hx  of HTN, HLD, hypothyroidism.  He resented to ER in Dec 2020 and found to be in afib with RVR. He was dx with acute cholecystitis with possible perforation with plans for cholecystectomy. Cardiology was asked to consult for help with afib with RVR and preop clearance with suspicion that AF was triggered by acute illness. Patient was initially placed on IV heparin in anticipation for surgery. Cardiology planned for Eliquis 5mg  BID postoperatively when ok with surgery.  Initial plan was for outpatient TEE cardioversion however given difficult rate control he was scheduled for inpatient procedure.  Fortunately prior to cardioversion, patient converted to NSR on his own with diltiazem and metoprolol therapies.  Given low CHA2DS2-VASc score of 1, no further AC was prescribed.  Plans were for close follow-up for repeat EKG and assessment of symptoms. Additional hospital issues include active GI bleeding>>no AC.   He was seen back in office by NP and doing well and maintaining NSR.  He is here today for followup and is doing well.  He denies any chest pain or pressure, , PND, orthopnea, LE edema, dizziness, palpitations or syncope. He has been complaining of feeling tired when he gets up in the am and gets sleepy during the day.  He also says that he snores a lot.  He is compliant with his meds and is tolerating meds with no SE.    Past Medical History:  Diagnosis Date  . Cardiomegaly    LVH  . Cataract   . Hemorrhoids   . Hyperlipidemia   . Hypertension   . Hypothyroidism   . Lower GI bleeding   . PAF (paroxysmal atrial fibrillation) ([place])     in setting of acute cholecystitis.  No anticoagulation due to setting of acute illness and CHADS2VAS score of 1    Past Surgical History:  Procedure Laterality Date  . CHOLECYSTECTOMY N/A 06/01/2019   Procedure: LAPAROSCOPIC CHOLECYSTECTOMY;  Surgeon: [name], MD;  Location: [facility];  Service: General;  Laterality: N/A;  . DOPPLER ECHOCARDIOGRAPHY      Current Medications: Current Meds  Medication Sig  . aspirin 81 MG tablet Take 81 mg by mouth daily.    . cetirizine (ZYRTEC ALLERGY) 10 MG tablet Take 1 tablet (10 mg total) by mouth daily.  [name] diltiazem (CARDIZEM CD) 180 MG 24 hr capsule Take 1 capsule (180 mg total) by mouth daily.  [name] losartan-hydrochlorothiazide (HYZAAR) 100-12.5 MG tablet Take 1 tablet by mouth daily.  . pravastatin (PRAVACHOL) 40 MG tablet Take 1 tablet (40 mg total) by mouth daily.  . [DISCONTINUED] metoprolol tartrate (LOPRESSOR) 100 MG tablet Take 1 tablet (100 mg total) by mouth 2 (two) times daily.     Allergies:   Garlic and Shrimp [shellfish allergy]   Social History   Socioeconomic History  . Marital status: Single    Spouse name: Not on file  . Number of children: 1  . Years of education: Not on file  . Highest education level: Not on file  Occupational History  . Occupation: Engineer, maintenance (IT)  Employer: [employer]  Tobacco Use  . Smoking status: Never Smoker  . Smokeless tobacco: Never Used  Substance and Sexual Activity  . Alcohol use: Yes    Comment: Rare  . Drug use: No  . Sexual activity: Yes  Other Topics Concern  . Not on file  Social History Narrative  . Not on file   Social Determinants of Health   Financial Resource Strain:   . Difficulty of Paying Living Expenses: Not on file  Food Insecurity:   . Worried About Programme researcher, broadcasting/film/video in the Last Year: Not on file  . Ran Out of Food in the Last Year: Not on file  Transportation Needs:   . Lack of Transportation (Medical): Not on file  . Lack of Transportation (Non-Medical): Not  on file  Physical Activity:   . Days of Exercise per Week: Not on file  . Minutes of Exercise per Session: Not on file  Stress:   . Feeling of Stress : Not on file  Social Connections:   . Frequency of Communication with Friends and Family: Not on file  . Frequency of Social Gatherings with Friends and Family: Not on file  . Attends Religious Services: Not on file  . Active Member of Clubs or Organizations: Not on file  . Attends Banker Meetings: Not on file  . Marital Status: Not on file     Family History: The patient's family history includes Asthma in his father; Hypertension in his father; Kidney disease in his father and mother.  ROS:   Please see the history of present illness.    ROS  All other systems reviewed and negative.   EKGs/Labs/Other Studies Reviewed:    The following studies were reviewed today: Hospital notes and NP OV note  EKG:  EKG is not ordered today.   Recent Labs: 05/30/2019: B Natriuretic Peptide 75.9 05/31/2019: TSH 2.498 06/01/2019: Magnesium 2.8 06/14/2019: ALT 34; BUN 14; Creatinine, Ser 1.19; Hemoglobin 11.6; Platelets 411; Potassium 4.9; Sodium 142   Recent Lipid Panel    Component Value Date/Time   CHOL 138 10/06/2017 1142   TRIG 85 10/06/2017 1142   HDL 47 10/06/2017 1142   CHOLHDL 2.9 10/06/2017 1142   CHOLHDL 3.0 04/03/2016 0754   VLDL 17 04/03/2016 0754   LDLCALC 74 10/06/2017 1142    Physical Exam:    VS:  BP (!) 168/94   Pulse 64   Ht 6\' 1"  (1.854 m)   Wt 273 lb 12.8 oz (124.2 kg)   SpO2 97%   BMI 36.12 kg/m     Wt Readings from Last 3 Encounters:  08/29/19 273 lb 12.8 oz (124.2 kg)  08/14/19 268 lb 9.6 oz (121.8 kg)  06/26/19 265 lb 12.8 oz (120.6 kg)     GEN:  Well nourished, well developed in no acute distress HEENT: Normal NECK: No JVD; No carotid bruits LYMPHATICS: No lymphadenopathy CARDIAC: RRR, no murmurs, rubs, gallops RESPIRATORY:  Clear to auscultation without rales, wheezing or rhonchi   ABDOMEN: Soft, non-tender, non-distended MUSCULOSKELETAL:  No edema; No deformity  SKIN: Warm and dry NEUROLOGIC:  Alert and oriented x 3 PSYCHIATRIC:  Normal affect   ASSESSMENT:    1. PAF (paroxysmal atrial fibrillation) (HCC)   2. Essential hypertension   3. Excessive daytime sleepiness    PLAN:    In order of problems listed above:  1.  PAF -occurred in setting of acute illness with acute cholecystitis and converted spontaneously to NSR with CCB and  BB. -maintaining NSR with no recurrent palpitations -no longterm anticoagulation due to CHADS2VASC score of 1.   2.  HTN -BP borderline controlled -continue Hyzaar 100/12.5mg  daily and Cardizem CD 120mg  daily -outside labs from PCP on KPN reviewed and showed a creatinine of 1.19 and K+ 4.9 -will change Lopressor to carvedilol 25mg  BID -HTN clinic with PharmD in 1 week  3.  Excessive daytime sleepiness -he feels tired when he gets up in the am and sleepy during the day -likely has OSA and could be driving his Bp and PAF -I will get a PSG to assess for OSA  Medication Adjustments/Labs and Tests Ordered: Current medicines are reviewed at length with the patient today.  Concerns regarding medicines are outlined above.  Orders Placed This Encounter  Procedures  . Nocturnal polysomnography (NPSG)   Meds ordered this encounter  Medications  . carvedilol (COREG) 25 MG tablet    Sig: Take 1 tablet (25 mg total) by mouth 2 (two) times daily.    Dispense:  180 tablet    Refill:  3    Signed, , MD  08/29/2019 8:43 AM    [facility]

## 2019-08-29 ENCOUNTER — Ambulatory Visit (INDEPENDENT_AMBULATORY_CARE_PROVIDER_SITE_OTHER): Payer: BC Managed Care – PPO | Admitting: Cardiology

## 2019-08-29 ENCOUNTER — Other Ambulatory Visit: Payer: Self-pay

## 2019-08-29 ENCOUNTER — Encounter: Payer: Self-pay | Admitting: Cardiology

## 2019-08-29 ENCOUNTER — Telehealth: Payer: Self-pay | Admitting: *Deleted

## 2019-08-29 VITALS — BP 168/94 | HR 64 | Ht 73.0 in | Wt 273.8 lb

## 2019-08-29 DIAGNOSIS — I10 Essential (primary) hypertension: Secondary | ICD-10-CM

## 2019-08-29 DIAGNOSIS — I480 Paroxysmal atrial fibrillation: Secondary | ICD-10-CM

## 2019-08-29 DIAGNOSIS — G4719 Other hypersomnia: Secondary | ICD-10-CM

## 2019-08-29 MED ORDER — CARVEDILOL 25 MG PO TABS: 25.0000 mg | ORAL_TABLET | Freq: Two times a day (BID) | ORAL | 3 refills | Status: DC

## 2019-08-29 NOTE — Patient Instructions (Signed)
Medication Instructions:  Your physician has recommended you make the following change in your medication:  1) STOP taking Lopressor  2) Start taking carvedilol (Coreg) 25 mg twice per day.  *If you need a refill on your cardiac medications before your next appointment, please call your pharmacy*  Testing/Procedures: Your physician has recommended that you have a sleep study. This test records several body functions during sleep, including: brain activity, eye movement, oxygen and carbon dioxide blood levels, heart rate and rhythm, breathing rate and rhythm, the flow of air through your mouth and nose, snoring, body muscle movements, and chest and belly movement.  Follow-Up: At [facility], you and your health needs are our priority.  As part of our continuing mission to provide you with exceptional heart care, we have created designated Provider Care Teams.  These Care Teams include your primary Cardiologist (physician) and Advanced Practice Providers (APPs -  Physician Assistants and Nurse Practitioners) who all work together to provide you with the care you need, when you need it.  Follow up with Pharm D for hypertension in one week.   Your next appointment:   3 month(s)  The format for your next appointment:   In Person  Provider:   Armanda Magic, MD

## 2019-08-29 NOTE — Telephone Encounter (Signed)
-----   Message from Theresia Majors, RN sent at 08/29/2019  8:42 AM EST ----- In lab sleep study has been ordered for excessive daytime sleepiness.   Thanks!

## 2019-08-30 ENCOUNTER — Telehealth: Payer: Self-pay | Admitting: *Deleted

## 2019-08-30 NOTE — Telephone Encounter (Signed)
Staff message sent to [place] [place] ok to schedule NSPG. Per Donella Stade @ [employer] on  08/30/19 no PA is required.

## 2019-08-31 NOTE — Telephone Encounter (Signed)
    Patient is scheduled for lab study on 09/17/19. Pt is scheduled for COVID screening on 09/15/19 11:35 prior to SS. Patient understands his sleep study will be done at [facility] sleep lab. Patient understands he will receive a sleep packet in a week or so. Patient understands to call if he does not receive the sleep packet in a timely manner. Patient agrees with treatment and thanked me for call.

## 2019-08-31 NOTE — Telephone Encounter (Signed)
Message Gaynelle Cage, CMA  Reesa Chew, CMA  Ok to schedule NSPG. Per Donella Stade @ [employer] no PA is required.

## 2019-09-06 NOTE — Progress Notes (Signed)
Patient IDFarhad [name]                 DOB: [birth year]                      MRN: [id number]     HPI: Martin [name] is a 59 y.o. male referred by Dr. Mayford Knife to HTN clinic. PMH is significant for paroxysmal atrial fibrillation, LVH due to hypertensive disease, BPH, hyperlipidemia, and AKI s/p acute cholecystitis (05/2019).  At last visit with Dr. Mayford Knife on 08/29/19, patient reported excessive daytime sleepiness potentially due to OSA which could be increasing his blood pressure. Patient pending PSG. Outside labs show his AKI has resolved with Scr 1.19, K 4.9 and BP in clinic was 168/94. Patient continued on losartan-HCTZ 100-12.5mg  daily and diltiazem CD 180mg  daily. Metoprolol tartrate 100mg  twice daily was stopped and carvedilol 25mg  BID was started.  Patient presents to clinic today for follow up of his hypertension. Patient reports only being able to consistently sleep 5 hours per night. He reports tolerating the switch from metoprolol to carvedilol well with HR 69 and denies symptoms of lightheadedness, dizziness, or fatigue. Patient endorses making changes to his diet and exercise recently. He is eating less [employer] and fried food and is planning to sign up for a gym membership soon. He takes his blood pressure twice daily at home with readings averaging 150's/90's. In clinic today, his blood pressure was 160's/100's. Due to recent medication changes, patient was hesitant to make any changes today.   Current HTN meds: losartan-HCTZ 100-12.5mg  daily, diltiazem CD 180mg  daily, carvedilol 25mg  BID Previously tried: metoprolol tartrate 100mg  twice daily (switched to carvedilol for BP control) BP goal: <130/80  Family History: hypertension (father), kidney disease (father, mother)  Social History: never smoker, denies drug use, reports rare alcohol use  Diet: fried food prior to cholecystectomy now eating more fruit, baked food, rarely eats McDonald[employer]  Breakfast: sausage, egg,  grits Lunch: potatoes, green beans, meat Dinner: peas & carrots, green beans, mashed potatoes w/ gravy, baked chicken (sometimes fried)   Exercise: none - going to sign up for [employer]   Home BP readings: takes twice daily 1-2 hours after medications Morning: 150's/90's, Evening: 150's/90's  Wt Readings from Last 3 Encounters:  08/29/19 273 lb 12.8 oz (124.2 kg)  08/14/19 268 lb 9.6 oz (121.8 kg)  06/26/19 265 lb 12.8 oz (120.6 kg)   BP Readings from Last 3 Encounters:  08/29/19 (!) 168/94  08/14/19 (!) 162/110  06/26/19 (!) 152/90   Pulse Readings from Last 3 Encounters:  08/29/19 64  08/14/19 (!) 59  06/26/19 60    Renal function: CrCl cannot be calculated (Patient's most recent lab result is older than the maximum 21 days allowed.).  Past Medical History:  Diagnosis Date  . Cardiomegaly    LVH  . Cataract   . Hemorrhoids   . Hyperlipidemia   . Hypertension   . Hypothyroidism   . Lower GI bleeding   . PAF (paroxysmal atrial fibrillation) (HCC)    in setting of acute cholecystitis.  No anticoagulation due to setting of acute illness and CHADS2VAS score of 1    Current Outpatient Medications on File Prior to Visit  Medication Sig Dispense Refill  . aspirin 81 MG tablet Take 81 mg by mouth daily.      . carvedilol (COREG) 25 MG tablet Take 1 tablet (25 mg total) by mouth 2 (two) times daily. 180 tablet 3  . cetirizine (  ZYRTEC ALLERGY) 10 MG tablet Take 1 tablet (10 mg total) by mouth daily. 30 tablet 0  . diltiazem (CARDIZEM CD) 180 MG 24 hr capsule Take 1 capsule (180 mg total) by mouth daily. 30 capsule 5  . losartan-hydrochlorothiazide (HYZAAR) 100-12.5 MG tablet Take 1 tablet by mouth daily. 90 tablet 3  . pravastatin (PRAVACHOL) 40 MG tablet Take 1 tablet (40 mg total) by mouth daily. 90 tablet 3   No current facility-administered medications on file prior to visit.    Allergies  Allergen Reactions  . Garlic Swelling  . Shrimp [Shellfish Allergy]  Swelling     Assessment/Plan:  1. Hypertension - Based on BP goal <130/80, patient's blood pressure is still above goal. Because of patient's hesitancy to make any medication changes today following multiple recent medication changes, we will draw labs today and follow up over the phone with his results. For today, patient will continue losartan-HCTZ 100-12.5mg  daily, diltiazem CD 180mg  daily, and carvedilol 25mg  twice daily. Based on his BMET, we will potentially increase his losartan-HCTZ 100-12.5mg  daily to 100-25mg  daily and start spironolactone 25mg  daily. Patient reported difficulty swallowing his diltiazem capsule. Because his current prescription is for the controlled release capsule Cardizem CD, it cannot be crushed or opened. Patient was advised to turn the capsule long-ways in his mouth and take with plenty of water but to let us know if it continues to be an issue. [name] was encouraged to limit sodium to <2g daily, try using a Mrs. DASH salt substitute, and start both aerobic and resistance exercise training. Patient was encouraged to bring in his home blood pressure cuff to his next visit as well as a record of his readings. We will follow up with him over the phone regarding his lab results and schedule a follow up appointment accordingly.   Ladoris Gene, PharmD Candidate  Megan E. Supple, PharmD, BCACP, [place] [address], [place], [place] [address] Phone: [phone]; Fax: [phone] 09/07/2019 9:58 AM

## 2019-09-07 ENCOUNTER — Other Ambulatory Visit: Payer: Self-pay

## 2019-09-07 ENCOUNTER — Ambulatory Visit (INDEPENDENT_AMBULATORY_CARE_PROVIDER_SITE_OTHER): Payer: BC Managed Care – PPO | Admitting: Pharmacist

## 2019-09-07 VITALS — BP 162/102 | HR 69

## 2019-09-07 DIAGNOSIS — I10 Essential (primary) hypertension: Secondary | ICD-10-CM | POA: Diagnosis not present

## 2019-09-07 LAB — BASIC METABOLIC PANEL
BUN/Creatinine Ratio: 14 (ref 10–24)
BUN: 18 mg/dL (ref 8–27)
CO2: 27 mmol/L (ref 20–29)
Calcium: 9.5 mg/dL (ref 8.6–10.2)
Chloride: 103 mmol/L (ref 96–106)
Creatinine, Ser: 1.32 mg/dL — ABNORMAL HIGH (ref 0.76–1.27)
GFR calc Af Amer: 67 mL/min/{1.73_m2} (ref >59)
GFR calc non Af Amer: 58 mL/min/{1.73_m2} — ABNORMAL LOW (ref >59)
Glucose: 116 mg/dL — ABNORMAL HIGH (ref 65–99)
Potassium: 4.2 mmol/L (ref 3.5–5.2)
Sodium: 143 mmol/L (ref 134–144)

## 2019-09-07 NOTE — Patient Instructions (Addendum)
It was great seeing you today!  Your blood pressure is still elevated and above your goal of <130/80. We will check some blood work today to check your kidney function and give you a call tomorrow with your results.   Continue taking losartan-HCTZ 100-12.5mg  daily, diltiazem CD 180mg  daily, and carvedilol 25mg  twice daily. Based on your labs we will consider increasing your losartan-HCTZ to 100-25mg  daily and possibly starting spironolactone 12.5mg  daily.

## 2019-09-08 ENCOUNTER — Telehealth: Payer: Self-pay | Admitting: Pharmacist

## 2019-09-08 DIAGNOSIS — I10 Essential (primary) hypertension: Secondary | ICD-10-CM

## 2019-09-08 NOTE — Telephone Encounter (Signed)
BMET stable. Pt has 2 weeks left on current losartan-HCTZ prescription and is willing to increase his dose from 100-12.5mg  to 100-25mg  when he picks up his refill. Scheduled f/u in clinic in 4 weeks (2 weeks after dose increase of HCTZ portion of his combo pill). Will discuss addition of spironolactone at f/u visit if needed.

## 2019-09-15 ENCOUNTER — Other Ambulatory Visit (HOSPITAL_COMMUNITY)
Admission: RE | Admit: 2019-09-15 | Discharge: 2019-09-15 | Disposition: A | Discharge status: Home / Self Care | Payer: BC Managed Care – PPO | Source: Ambulatory Visit | Attending: Cardiology | Admitting: Cardiology

## 2019-09-15 DIAGNOSIS — Z20822 Contact with and (suspected) exposure to covid-19: Secondary | ICD-10-CM | POA: Insufficient documentation

## 2019-09-15 DIAGNOSIS — Z01812 Encounter for preprocedural laboratory examination: Secondary | ICD-10-CM | POA: Diagnosis not present

## 2019-09-15 LAB — SARS CORONAVIRUS 2 (TAT 6-24 HRS): SARS Coronavirus 2: NEGATIVE

## 2019-09-17 ENCOUNTER — Ambulatory Visit (HOSPITAL_BASED_OUTPATIENT_CLINIC_OR_DEPARTMENT_OTHER): Payer: BC Managed Care – PPO | Attending: Cardiology | Admitting: Cardiology

## 2019-09-17 DIAGNOSIS — G4719 Other hypersomnia: Secondary | ICD-10-CM | POA: Diagnosis not present

## 2019-09-17 DIAGNOSIS — R0683 Snoring: Secondary | ICD-10-CM | POA: Insufficient documentation

## 2019-09-17 DIAGNOSIS — G4733 Obstructive sleep apnea (adult) (pediatric): Secondary | ICD-10-CM | POA: Diagnosis not present

## 2019-09-18 ENCOUNTER — Other Ambulatory Visit: Payer: Self-pay

## 2019-09-25 NOTE — Procedures (Signed)
   Patient Name: Martin [name] Date:09/17/2019 Gender: Male D.O.B: October 20, 1960 Age (years): 1 Referring Provider: Armanda Magic MD, ABSM Height (inches): 73 Interpreting Physician: Armanda Magic MD, ABSM Weight (lbs): 263 RPSGT: Rosette Reveal BMI: 35 MRN: [id number] Neck Size: 18.50  CLINICAL INFORMATION Sleep Study Type: NPSG  Indication for sleep study: Daytime Fatigue, Hypertension  SLEEP STUDY TECHNIQUE As per the AASM Manual for the Scoring of Sleep and Associated Events v2.3 (April 2016) with a hypopnea requiring 4% desaturations.  The channels recorded and monitored were frontal, central and occipital EEG, electrooculogram (EOG), submentalis EMG (chin), nasal and oral airflow, thoracic and abdominal wall motion, anterior tibialis EMG, snore microphone, electrocardiogram, and pulse oximetry.  MEDICATIONS Medications self-administered by patient taken the night of the study : N/A  SLEEP ARCHITECTURE The study was initiated at 9:47:12 PM and ended at 4:30:17 AM.  Sleep onset time was 8.3 minutes and the sleep efficiency was 60.3%. The total sleep time was 243 minutes.  Stage REM latency was 166.0 minutes.  The patient spent 14.2% of the night in stage N1 sleep, 72.0% in stage N2 sleep, 0.0% in stage N3 and 13.8% in REM.  Alpha intrusion was absent.  Supine sleep was 27.16%.  RESPIRATORY PARAMETERS The overall apnea/hypopnea index (AHI) was 8.1 per hour. There were 5 total apneas, including 5 obstructive, 0 central and 0 mixed apneas. There were 28 hypopneas and 23 RERAs.  The AHI during Stage REM sleep was 43.0 per hour.  AHI while supine was 4.5 per hour.  The mean oxygen saturation was 95.5%. The minimum SpO2 during sleep was 85.0%.  moderate snoring was noted during this study.  CARDIAC DATA The 2 lead EKG demonstrated sinus rhythm. The mean heart rate was 62.0 beats per minute. Other EKG findings include: None.  LEG MOVEMENT DATA The total PLMS  were 0 with a resulting PLMS index of 0.0. Associated arousal with leg movement index was 0.0 .  IMPRESSIONS - Mild obstructive sleep apnea occurred during this study (AHI = 8.1/h) but severe during REM sleep with AHI = 43/hr. - No significant central sleep apnea occurred during this study (CAI = 0.0/h). - Mild oxygen desaturation was noted during this study (Min O2 = 85.0%). - The patient snored with moderate snoring volume. - No cardiac abnormalities were noted during this study. - Clinically significant periodic limb movements did not occur during sleep. No significant associated arousals.  DIAGNOSIS  - Obstructive Sleep Apnea (327.23 [G47.33 ICD-10])  RECOMMENDATIONS - Therapeutic CPAP titration to determine optimal pressure required to alleviate sleep disordered breathing. - Avoid alcohol, sedatives and other CNS depressants that may worsen sleep apnea and disrupt normal sleep architecture. - Sleep hygiene should be reviewed to assess factors that may improve sleep quality. - Weight management and regular exercise should be initiated or continued if appropriate.  [Electronically signed] 09/25/2019 05:46 PM  Armanda Magic MD, ABSM Diplomate, American Board of Sleep Medicine

## 2019-09-28 ENCOUNTER — Telehealth: Payer: Self-pay | Admitting: *Deleted

## 2019-09-28 NOTE — Telephone Encounter (Signed)
Informed patient of sleep study results and patient understanding was verbalized. Patient understands his sleep study showed they have sleep apnea and recommend CPAP titration. Please set up titration in the sleep lab.  Pt is aware and agreeable to his results.  Titration sent to sleep pool   

## 2019-09-28 NOTE — Telephone Encounter (Signed)
-----   Message from Quintella Reichert, MD sent at 09/25/2019  5:48 PM EDT ----- Please let patient know that they have sleep apnea and recommend CPAP titration. Please set up titration in the sleep lab.

## 2019-10-06 ENCOUNTER — Ambulatory Visit (INDEPENDENT_AMBULATORY_CARE_PROVIDER_SITE_OTHER): Payer: BC Managed Care – PPO | Admitting: Pharmacist

## 2019-10-06 ENCOUNTER — Other Ambulatory Visit: Payer: Self-pay

## 2019-10-06 VITALS — BP 149/99 | HR 70

## 2019-10-06 DIAGNOSIS — I10 Essential (primary) hypertension: Secondary | ICD-10-CM | POA: Diagnosis not present

## 2019-10-06 MED ORDER — DILTIAZEM HCL ER COATED BEADS 240 MG PO CP24: 240.0000 mg | ORAL_CAPSULE | Freq: Every day | ORAL | 3 refills | Status: DC

## 2019-10-06 MED ORDER — LOSARTAN POTASSIUM-HCTZ 100-25 MG PO TABS: 1.0000 | ORAL_TABLET | Freq: Every day | ORAL | 3 refills | Status: DC

## 2019-10-06 NOTE — Progress Notes (Signed)
Patient IDGirard Koontz                 DOB: 1960/07/12                      MRN: [id number]     HPI: Won Kreuzer is a 59 y.o. male referred by Dr. Mayford Knife to HTN clinic. PMH is significant for paroxysmal atrial fibrillation, LVH due to hypertensive disease, BPH, hyperlipidemia, and AKI s/p acute cholecystitis (05/2019).  At last visit with Dr. Mayford Knife on 08/29/19, patient reported excessive daytime sleepiness potentially due to OSA which could be increasing his blood pressure. Patient pending PSG. Outside labs show his AKI has resolved with Scr 1.19, K 4.9 and BP in clinic was 168/94. Patient continued on losartan-HCTZ 100-12.5mg  daily and diltiazem CD 180mg  daily. Metoprolol tartrate 100mg  twice daily was stopped and carvedilol 25mg  BID was started.  At last visit with HTN clinic, patient reported only being able to consistently sleep 5 hours per night. Patient endorsed making changes to his diet and exercise recently. His home readings were averaging 150's/90's. He was hesitant to make medication changes, but was agreeable to increasing losartan/HCTZ from 100/12.5 to 100/25mg  daily. However, it appears new Rx was not sent in, therefore patient remained on 100/12.5mg  daily.  Patient has been diagnosed with sleep apnea. Has not received CPAP yet.  Patient presents today for follow up. He brings a list of home blood pressures, but not his meter. Most of the blood pressures are very elevated. He denies dizziness, lightheadedness, headache, blurred vision, SOB, swelling (mild ankle- not bothersome). States his blood pressure was always borderline but has been out of control since he got his gallbladder out. States he has been working on his diet and exercise.  Current HTN meds: losartan-HCTZ 100-12.5mg  daily, diltiazem CD 180mg  daily, carvedilol 25mg  BID Previously tried: metoprolol tartrate 100mg  twice daily (switched to carvedilol for BP control) BP goal: <130/80  Family History: hypertension  (father), kidney disease (father, mother)  Social History: never smoker, denies drug use, reports rare alcohol use  Diet: fried food prior to cholecystectomy now eating more fruit, baked food, rarely eats [employer]  Breakfast: sausage, egg, grits Lunch: potatoes, green beans, meat Dinner: peas & carrots, green beans, mashed potatoes w/ gravy, baked chicken (sometimes fried)   Exercise: none - going to sign up for [employer]   Home BP readings: 180/98, 165/99, 156/94, 171/102, 165/100, 159/92, 170/102, 163/93, 156/92, 172/102, 150/97, 180/101, 159/92, 239/122 (before colonoscopy) HR 62-75 (from memory)  Wt Readings from Last 3 Encounters:  09/17/19 263 lb (119.3 kg)  08/29/19 273 lb 12.8 oz (124.2 kg)  08/14/19 268 lb 9.6 oz (121.8 kg)   BP Readings from Last 3 Encounters:  09/07/19 (!) 162/102  08/29/19 (!) 168/94  08/14/19 (!) 162/110   Pulse Readings from Last 3 Encounters:  09/07/19 69  08/29/19 64  08/14/19 (!) 59    Renal function: CrCl cannot be calculated (Patient's most recent lab result is older than the maximum 21 days allowed.).  Past Medical History:  Diagnosis Date  . Cardiomegaly    LVH  . Cataract   . Hemorrhoids   . Hyperlipidemia   . Hypertension   . Hypothyroidism   . Lower GI bleeding   . PAF (paroxysmal atrial fibrillation) (HCC)    in setting of acute cholecystitis.  No anticoagulation due to setting of acute illness and CHADS2VAS score of 1    Current Outpatient Medications on File Prior  to Visit  Medication Sig Dispense Refill  . aspirin 81 MG tablet Take 81 mg by mouth daily.      . carvedilol (COREG) 25 MG tablet Take 1 tablet (25 mg total) by mouth 2 (two) times daily. 180 tablet 3  . cetirizine (ZYRTEC ALLERGY) 10 MG tablet Take 1 tablet (10 mg total) by mouth daily. 30 tablet 0  . diltiazem (CARDIZEM CD) 180 MG 24 hr capsule Take 1 capsule (180 mg total) by mouth daily. 30 capsule 5  . losartan-hydrochlorothiazide (HYZAAR) 100-12.5  MG tablet Take 1 tablet by mouth daily. 90 tablet 3  . pravastatin (PRAVACHOL) 40 MG tablet Take 1 tablet (40 mg total) by mouth daily. 90 tablet 3   No current facility-administered medications on file prior to visit.    Allergies  Allergen Reactions  . Garlic Swelling  . Shrimp [Shellfish Allergy] Swelling     Assessment/Plan:  1. Hypertension - Blood pressure is above goal of <130/80. Blood pressure in clinic, although high, is better than home readings. Patient is hesitant about changes as he does not want to take any more pills. I did explain that adding a medication may be more effective than increasing current medications, but patient wished to increase doses. Therefore, will increase losartan/HCTZ to 100/25mg  daily and increase diltiazem to 240mg  daily. Will follow up in clinic in 2 weeks. Hopefully once patient is set up with CPAP, his blood pressure will improve some as well. Will check BMP at that appointment. Patient asked to bring home blood pressure machine with him to next appointment.

## 2019-10-06 NOTE — Patient Instructions (Addendum)
It was a pleasure to meet you!  Please STOP taking losartan 100/12.5mg  STOP taking diltiazem 180mg    START taking losartan 100/25mg  daily START taking dilitazem 240mg  daily  CONTINUE taking carvedilol 25mg  twice a day  Continue to check your blood pressure daily  Call a [phone] with any questions or concerns  Before checking your blood pressure make sure: You are seated and quite for 5 min before checking Feet are flat on the floor Siting in chair with your back supported straight up and down Arm resting on table or arm of chair at heart level Bladder is empty You have NOT had caffeine or tobacco within the last 30 min  Check your blood pressure 2-3 times about 1-2 min apart. Usually the first reading will be the highest. Record these readings.  Only check your blood pressure once a day, unless otherwise directed Record your blood pressure readings and bring them to all your appointments. If your meter stores your readings in its memory, then you may bring your blood pressure meter with you to your appointments.  You can find a list of validated (accurate) blood pressure cuffs at  Lifestyle changes can make a world of difference and are even more important than medications: Try to keep your sodium intake to 2300 mg of sodium per day Get 6-8 uninterrupted hours of sleep per night Aim for a goal of 150 min of moderate aerobic exercise (ie brisk walking, bike riding) per week

## 2019-10-16 ENCOUNTER — Telehealth: Payer: Self-pay

## 2019-10-16 DIAGNOSIS — E785 Hyperlipidemia, unspecified: Secondary | ICD-10-CM

## 2019-10-16 MED ORDER — PRAVASTATIN SODIUM 40 MG PO TABS: 40.0000 mg | ORAL_TABLET | Freq: Every day | ORAL | 0 refills | Status: DC

## 2019-10-16 NOTE — Telephone Encounter (Signed)
Pt. Wife called stating that he needs a refill on his pravastatin 40 mg pt. Last apt was 08/14/19

## 2019-10-16 NOTE — Telephone Encounter (Signed)
Done and patient is due for lipids, should be fasting at Aug appt.

## 2019-10-19 NOTE — Progress Notes (Addendum)
Patient IDKeyler [name]                 DOB: Mar 08, 1961                      MRN: [id number]     HPI: Martin [name] is a 59 y.o. male referred by [name] to HTN clinic. PMH is significant for left ventricular hypertrophy, A Fib with RVR, acute renal failure (resolved), hyperlipidemia, hypothyroidism, and cholecystitis.  Patient had galbladder removed in December 2020.  History of OSA, had sleep study on 09/17/19, CPAP recommended.  Seen by HTN clinic on 10/06/19 and BP readings averaged 150s/90s.  Diltiazem increased from 180 mg to 240 mg and losartan/hctz increased from 100/12.5mg  to 100/25mg .  Pleasant 59 year old male presents today for BP follow up.  Brought home BP cuff, however wife uses also.  Reports the lower numbers are his wife's numbers.  Diltiazem and losartan/hctz increased at last visit, however has not noticed his BP decrease. From memory he thinks his lowest is 149/99.  Verified home BP cuff with manual reading in office and reading was comparable.  Is trying to walk around his school for 30 minutes to an hour at least 3 days a week.  Is not taking his medications on the weekends.  Had sleep study in March but has not received a CPAP machine.  Reports his wife notes he snores.  Dinner last night was chicken tenders and rice from [employer], did not add salt.  BP elevated in office on patient's home cuff and using office equipment.  Reports took medications this morning and has not had caffeine.  Needs BMP.  Current HTN meds: losartan/hctz 100/25mg  daily (increased at last visit), diltiazem CD 240mg  daily, carvedilol 25mg  BID Previously tried: metoprolol tartrate 100 mg BID BP goal: <130/80  Family History: Father: HTN, Kidney disease, asthma.  Mother: kidney disease  Social History:never smoker, denies drug use, reports rare alcohol use   Diet: fried food prior to cholecystectomy now eating more fruit, baked food, rarely eats [employer]  Breakfast: sausage, egg, grits Lunch:  potatoes, green beans, meat Dinner: peas & carrots, green beans, mashed potatoes w/ gravy, baked chicken (sometimes fried)   Exercise: Walks around school 30 minutes to an hour at least 3 days a week  Home BP readings:  158/89, 151/73, 150/84, 164/98 (from home monitor), lowest 149/99 (pt memory).    Wt Readings from Last 3 Encounters:  09/17/19 263 lb (119.3 kg)  08/29/19 273 lb 12.8 oz (124.2 kg)  08/14/19 268 lb 9.6 oz (121.8 kg)   BP Readings from Last 3 Encounters:  10/06/19 (!) 149/99  09/07/19 (!) 162/102  08/29/19 (!) 168/94   Pulse Readings from Last 3 Encounters:  10/06/19 70  09/07/19 69  08/29/19 64    Renal function: CrCl cannot be calculated (Patient's most recent lab result is older than the maximum 21 days allowed.).  Most recent Scr 1.32 on 09/07/19  Past Medical History:  Diagnosis Date  . Cardiomegaly    LVH  . Cataract   . Hemorrhoids   . Hyperlipidemia   . Hypertension   . Hypothyroidism   . Lower GI bleeding   . PAF (paroxysmal atrial fibrillation) ([place])    in setting of acute cholecystitis.  No anticoagulation due to setting of acute illness and CHADS2VAS score of 1    Current Outpatient Medications on File Prior to Visit  Medication Sig Dispense Refill  . aspirin 81  MG tablet Take 81 mg by mouth daily.      . carvedilol (COREG) 25 MG tablet Take 1 tablet (25 mg total) by mouth 2 (two) times daily. 180 tablet 3  . cetirizine (ZYRTEC ALLERGY) 10 MG tablet Take 1 tablet (10 mg total) by mouth daily. 30 tablet 0  . diltiazem (CARDIZEM CD) 240 MG 24 hr capsule Take 1 capsule (240 mg total) by mouth daily. 90 capsule 3  . losartan-hydrochlorothiazide (HYZAAR) 100-25 MG tablet Take 1 tablet by mouth daily. 90 tablet 3  . pravastatin (PRAVACHOL) 40 MG tablet Take 1 tablet (40 mg total) by mouth daily. 90 tablet 0   No current facility-administered medications on file prior to visit.    Allergies  Allergen Reactions  . Garlic Swelling  . Shrimp  [Shellfish Allergy] Swelling     Assessment/Plan:  1. Hypertension - BP above goal <130/80 despite medication titration.  Ordered BMP and consider addition of spironolactone pending kidney function and electrolytes.  Will follow up with acquiring patient's CPAP machine and explained OSA can increase BP.  Recommended he take medications every day, including weekends, and to keep home BP log since he shares cuff with wife. Pt seems to have white coat HTN, will make medication plans based off of home readings rather than clinic readings.  Recommended continued exercise at least 30 minutes a day at least 5 days a week.  Will follow up with medication plan and HTN follow up when labs result.  Laural Golden, PharmD, BCACP, CDCES [facility] [address] [place], [place] [address] Phone: [phone]; Fax: [phone] 10/20/2019 9:47 AM   Addendum: Per Veda Canning: "He is awaiting a cpap titration to be precerted with his insurance then he will get a cpap."

## 2019-10-19 NOTE — Patient Instructions (Addendum)
It was nice to see you today.  Your blood pressure goal is less than 130/80.    Continue taking Losartan/HCTZ 100/25mg  once daily Continue taking carvedilol 25mg  twice daily Continue taking diltiazem 240mg  once daily  Remember to take your medications every day including weekends  We will give you a call once your results come back to discuss adding a new medication

## 2019-10-20 ENCOUNTER — Ambulatory Visit (INDEPENDENT_AMBULATORY_CARE_PROVIDER_SITE_OTHER): Payer: BC Managed Care – PPO | Admitting: Pharmacist

## 2019-10-20 ENCOUNTER — Other Ambulatory Visit: Payer: Self-pay

## 2019-10-20 ENCOUNTER — Telehealth: Payer: Self-pay | Admitting: *Deleted

## 2019-10-20 VITALS — BP 178/116 | HR 70

## 2019-10-20 DIAGNOSIS — I10 Essential (primary) hypertension: Secondary | ICD-10-CM | POA: Diagnosis not present

## 2019-10-20 DIAGNOSIS — G4719 Other hypersomnia: Secondary | ICD-10-CM

## 2019-10-20 LAB — BASIC METABOLIC PANEL
BUN/Creatinine Ratio: 12 (ref 10–24)
BUN: 16 mg/dL (ref 8–27)
CO2: 25 mmol/L (ref 20–29)
Calcium: 9.6 mg/dL (ref 8.6–10.2)
Chloride: 102 mmol/L (ref 96–106)
Creatinine, Ser: 1.33 mg/dL — ABNORMAL HIGH (ref 0.76–1.27)
GFR calc Af Amer: 66 mL/min/{1.73_m2} (ref >59)
GFR calc non Af Amer: 57 mL/min/{1.73_m2} — ABNORMAL LOW (ref >59)
Glucose: 120 mg/dL — ABNORMAL HIGH (ref 65–99)
Potassium: 3.7 mmol/L (ref 3.5–5.2)
Sodium: 142 mmol/L (ref 134–144)

## 2019-10-20 NOTE — Telephone Encounter (Signed)
-----   Message from Martin [name], RPH-CPP sent at 10/20/2019  9:56 AM EDT ----- Looks like this pt had a sleep study and was supposed to be set up for CPAP but he never heard any follow up about this. Are you able to assist? Thanks! [name]

## 2019-10-20 NOTE — Telephone Encounter (Signed)
Awaiting insurance pre cert.

## 2019-10-30 NOTE — Telephone Encounter (Signed)
Per [facility] no precert is required for [id number], or [id number]  (CPAP titration).    Ref# [id number].  Will forward to Veda Canning to schedule.

## 2019-10-31 ENCOUNTER — Telehealth: Payer: Self-pay | Admitting: *Deleted

## 2019-10-31 NOTE — Telephone Encounter (Signed)
Patient is scheduled for lab study on 11/14/19. pt is scheduled for COVID screening on 11/11/19 10:55 prior to titration.  Patient understands her sleep study will be done at [facility] sleep lab. Patient understands she will receive a sleep packet in a week or so. Patient understands to call if she does not receive the sleep packet in a timely manner.  Left detailed message on voicemail with date and time of titration and informed patient to call back to confirm or reschedule.

## 2019-10-31 NOTE — Telephone Encounter (Signed)
-----   Message from Patricia Pesa, RN sent at 10/30/2019 11:46 AM EDT ----- Regarding: FW: prwecert Per Neysa Bonito with [employer] no precert is required for CPAP titration.  Please schedule.  See phone note for reference number if needed.  Thank you ----- Message ----- From: Reesa Chew, CMA Sent: 09/28/2019  10:55 AM EDT To: [facility] Sleep Studies Subject: prwecert                                       recommend CPAP titration

## 2019-10-31 NOTE — Addendum Note (Signed)
Addended by: Reesa Chew on: 10/31/2019 03:14 PM   Modules accepted: Orders

## 2019-11-11 ENCOUNTER — Other Ambulatory Visit (HOSPITAL_COMMUNITY)
Admission: RE | Admit: 2019-11-11 | Discharge: 2019-11-11 | Disposition: A | Discharge status: Home / Self Care | Payer: BC Managed Care – PPO | Source: Ambulatory Visit | Attending: Cardiology | Admitting: Cardiology

## 2019-11-11 DIAGNOSIS — Z01812 Encounter for preprocedural laboratory examination: Secondary | ICD-10-CM | POA: Diagnosis not present

## 2019-11-11 DIAGNOSIS — Z20822 Contact with and (suspected) exposure to covid-19: Secondary | ICD-10-CM | POA: Insufficient documentation

## 2019-11-11 LAB — SARS CORONAVIRUS 2 (TAT 6-24 HRS): SARS Coronavirus 2: NEGATIVE

## 2019-11-14 ENCOUNTER — Other Ambulatory Visit: Payer: Self-pay

## 2019-11-14 ENCOUNTER — Ambulatory Visit (HOSPITAL_BASED_OUTPATIENT_CLINIC_OR_DEPARTMENT_OTHER): Payer: BC Managed Care – PPO | Attending: Cardiology | Admitting: Cardiology

## 2019-11-14 VITALS — Temp 96.8°F | Ht 73.0 in | Wt 245.0 lb

## 2019-11-14 DIAGNOSIS — G4719 Other hypersomnia: Secondary | ICD-10-CM | POA: Insufficient documentation

## 2019-11-14 DIAGNOSIS — G4733 Obstructive sleep apnea (adult) (pediatric): Secondary | ICD-10-CM | POA: Insufficient documentation

## 2019-11-15 NOTE — Procedures (Signed)
    Patient Name: Martin [name] Date: 11/14/2019 Gender: Male D.O.B: [birth year] Age (years): 22 Referring Provider: Armanda Magic MD, ABSM Height (inches): 73 Interpreting Physician: Armanda Magic MD, ABSM Weight (lbs): 245 RPSGT: Armen Pickup BMI: 32 MRN: [id number] Neck Size: 18.50  CLINICAL INFORMATION The patient is referred for a CPAP titration to treat sleep apnea.  SLEEP STUDY TECHNIQUE As per the AASM Manual for the Scoring of Sleep and Associated Events v2.3 (April 2016) with a hypopnea requiring 4% desaturations.  The channels recorded and monitored were frontal, central and occipital EEG, electrooculogram (EOG), submentalis EMG (chin), nasal and oral airflow, thoracic and abdominal wall motion, anterior tibialis EMG, snore microphone, electrocardiogram, and pulse oximetry. Continuous positive airway pressure (CPAP) was initiated at the beginning of the study and titrated to treat sleep-disordered breathing.  MEDICATIONS Medications self-administered by patient taken the night of the study : N/A  TECHNICIAN COMMENTS Comments added by technician: NO REST ROOM VISTED Comments added by scorer: N/A  RESPIRATORY PARAMETERS Optimal PAP Pressure (cm): 12  AHI at Optimal Pressure (/hr):0.0 Overall Minimal O2 (%):89.0  Supine % at Optimal Pressure (%):100 Minimal O2 at Optimal Pressure (%): 91.0   SLEEP ARCHITECTURE The study was initiated at 10:07:40 PM and ended at 4:43:27 AM.  Sleep onset time was 22.0 minutes and the sleep efficiency was 54.7%. The total sleep time was 216.5 minutes.  The patient spent 13.6% of the night in stage N1 sleep, 70.7% in stage N2 sleep, 0.0% in stage N3 and 15.7% in REM.Stage REM latency was 126.0 minutes  Wake after sleep onset was 157.3. Alpha intrusion was absent. Supine sleep was 30.28%.  CARDIAC DATA The 2 lead EKG demonstrated sinus rhythm. The mean heart rate was 63.6 beats per minute. Other EKG findings include:  PVCs.  LEG MOVEMENT DATA The total Periodic Limb Movements of Sleep (PLMS) were 0. The PLMS index was 0.0. A PLMS index of <15 is considered normal in adults.  IMPRESSIONS - The optimal PAP pressure was 12 cm of water. - Central sleep apnea was not noted during this titration (CAI = 0.0/h). - Mild oxygen desaturations were observed during this titration (min O2 = 89.0%). - No snoring was audible during this study. - PVCs were observed during this study. - Clinically significant periodic limb movements were not noted during this study. Arousals associated with PLMs were rare.  DIAGNOSIS - Obstructive Sleep Apnea (327.23 [G47.33 ICD-10])  RECOMMENDATIONS - Trial of CPAP therapy on 12 cm H2O with a Medium size Resmed Full Face Mask AirFit F20 mask and heated humidification. - Avoid alcohol, sedatives and other CNS depressants that may worsen sleep apnea and disrupt normal sleep architecture. - Sleep hygiene should be reviewed to assess factors that may improve sleep quality. - Weight management and regular exercise should be initiated or continued. - Return to Sleep Center for re-evaluation after 8 weeks of therapy  [Electronically signed] 11/15/2019 08:16 PM  Armanda Magic MD, ABSM Diplomate, American Board of Sleep Medicine

## 2019-11-15 NOTE — Progress Notes (Signed)
Schedule him to see me in one month

## 2019-11-16 ENCOUNTER — Telehealth: Payer: Self-pay | Admitting: *Deleted

## 2019-11-16 NOTE — Telephone Encounter (Signed)
Informed patient of sleep study results and patient understanding was verbalized. °Patient understands his sleep study showed they had a successful PAP titration and let DME know that orders are in EPIC.  Please set up 8 week OV with me.  ° °Upon patient request DME selection is [facility]. °Patient understands he will be contacted by [facility] to set up his cpap. °Patient understands to call if [facility] does not contact him with new setup in a timely manner. °Patient understands they will be called once confirmation has been received from [facility] that they have received their new machine to schedule 10 week follow up appointment. ° °[facility] notified of new cpap order  °Please add to airview °Patient was grateful for the call and thanked me. ° ° ° ° ° ° ° ° ° ° ° ° ° ° ° ° °

## 2019-11-16 NOTE — Telephone Encounter (Signed)
-----   Message from Quintella Reichert, MD sent at 11/15/2019  8:19 PM EDT ----- Please let patient know that they had a successful PAP titration and let DME know that orders are in EPIC.  Please set up 8 week OV with me.

## 2019-11-17 ENCOUNTER — Telehealth: Payer: Self-pay

## 2019-11-17 NOTE — Progress Notes (Signed)
LVM for pt to call back for a follow up appt. Sleep study. KH

## 2019-11-17 NOTE — Telephone Encounter (Signed)
Called pt to advise him dr.lalonde would like to see him in one month to follow up on sleep study. KH

## 2019-11-17 NOTE — Telephone Encounter (Signed)
-----   Message from Ronnald Nian, MD sent at 11/15/2019  9:35 PM EDT -----   ----- Message ----- From: Quintella Reichert, MD Sent: 11/15/2019   8:20 PM EDT To: Ronnald Nian, MD

## 2019-12-07 ENCOUNTER — Telehealth: Payer: Self-pay

## 2019-12-07 ENCOUNTER — Other Ambulatory Visit: Payer: Self-pay

## 2019-12-07 ENCOUNTER — Ambulatory Visit (INDEPENDENT_AMBULATORY_CARE_PROVIDER_SITE_OTHER): Payer: BC Managed Care – PPO | Admitting: Cardiology

## 2019-12-07 ENCOUNTER — Encounter: Payer: Self-pay | Admitting: Cardiology

## 2019-12-07 VITALS — BP 148/106 | HR 73 | Ht 73.5 in | Wt 274.6 lb

## 2019-12-07 DIAGNOSIS — I480 Paroxysmal atrial fibrillation: Secondary | ICD-10-CM

## 2019-12-07 DIAGNOSIS — I10 Essential (primary) hypertension: Secondary | ICD-10-CM | POA: Diagnosis not present

## 2019-12-07 DIAGNOSIS — G4733 Obstructive sleep apnea (adult) (pediatric): Secondary | ICD-10-CM

## 2019-12-07 MED ORDER — DILTIAZEM HCL ER COATED BEADS 300 MG PO CP24: 300.0000 mg | ORAL_CAPSULE | Freq: Every day | ORAL | 3 refills | Status: DC

## 2019-12-07 NOTE — Patient Instructions (Signed)
Please check your BP daily for one week and call us with your readings.   Medication Instructions:  Your physician has recommended you make the following change in your medication:  1) INCREASE Cardizem to 300 mg daily  *If you need a refill on your cardiac medications before your next appointment, please call your pharmacy*   Follow-Up: At [facility], you and your health needs are our priority.  As part of our continuing mission to provide you with exceptional heart care, we have created designated Provider Care Teams.  These Care Teams include your primary Cardiologist (physician) and Advanced Practice Providers (APPs -  Physician Assistants and Nurse Practitioners) who all work together to provide you with the care you need, when you need it.  Your next appointment:   8 week(s)  The format for your next appointment:   Virtual Visit   Provider:   Armanda Magic, MD

## 2019-12-07 NOTE — Telephone Encounter (Signed)
Called pt to advise of the need of a follow up appt in 30 days  For follow up on cpap. KH

## 2019-12-07 NOTE — Addendum Note (Signed)
Addended by: Theresia Majors on: 12/07/2019 09:28 AM   Modules accepted: Orders

## 2019-12-07 NOTE — Telephone Encounter (Signed)
-----   Message from Ronnald Nian, MD sent at 12/07/2019  8:39 AM EDT -----   ----- Message ----- From: Quintella Reichert, MD Sent: 12/07/2019   8:36 AM EDT To: Ronnald Nian, MD

## 2019-12-07 NOTE — Progress Notes (Signed)
Have him follow-up with me after he gets his CPAP instead of with the cardiologist.

## 2019-12-07 NOTE — Telephone Encounter (Signed)
°  Patient Consent for Virtual Visit         [facility] has provided verbal consent on 12/07/2019 for a virtual visit (video or telephone).   CONSENT FOR VIRTUAL VISIT FOR:  Martin [name]  By participating in this virtual visit I agree to the following:  I hereby voluntarily request, consent and authorize [facility] and its employed or contracted physicians, physician assistants, nurse practitioners or other licensed health care professionals (the Practitioner), to provide me with telemedicine health care services (the Services") as deemed necessary by the treating Practitioner. I acknowledge and consent to receive the Services by the Practitioner via telemedicine. I understand that the telemedicine visit will involve communicating with the Practitioner through live audiovisual communication technology and the disclosure of certain medical information by electronic transmission. I acknowledge that I have been given the opportunity to request an in-person assessment or other available alternative prior to the telemedicine visit and am voluntarily participating in the telemedicine visit.  I understand that I have the right to withhold or withdraw my consent to the use of telemedicine in the course of my care at any time, without affecting my right to future care or treatment, and that the Practitioner or I may terminate the telemedicine visit at any time. I understand that I have the right to inspect all information obtained and/or recorded in the course of the telemedicine visit and may receive copies of available information for a reasonable fee.  I understand that some of the potential risks of receiving the Services via telemedicine include:   Delay or interruption in medical evaluation due to technological equipment failure or disruption;  Information transmitted may not be sufficient (e.g. poor resolution of images) to allow for appropriate medical decision making by the  Practitioner; and/or   In rare instances, security protocols could fail, causing a breach of personal health information.  Furthermore, I acknowledge that it is my responsibility to provide information about my medical history, conditions and care that is complete and accurate to the best of my ability. I acknowledge that Practitioner's advice, recommendations, and/or decision may be based on factors not within their control, such as incomplete or inaccurate data provided by me or distortions of diagnostic images or specimens that may result from electronic transmissions. I understand that the practice of medicine is not an exact science and that Practitioner makes no warranties or guarantees regarding treatment outcomes. I acknowledge that a copy of this consent can be made available to me via my patient portal [facility] MyChart), or I can request a printed copy by calling the office of [facility].    I understand that my insurance will be billed for this visit.   I have read or had this consent read to me.  I understand the contents of this consent, which adequately explains the benefits and risks of the Services being provided via telemedicine.   I have been provided ample opportunity to ask questions regarding this consent and the Services and have had my questions answered to my satisfaction.  I give my informed consent for the services to be provided through the use of telemedicine in my medical care

## 2019-12-07 NOTE — Telephone Encounter (Signed)
Pt is picking up Cpap machine tomorrow and said he will call back to schedule appt 12/07/19

## 2019-12-07 NOTE — Progress Notes (Signed)
Cardiology Office Note:    Date:  12/07/2019   ID:  Martin [name], DOB 10/03/1960, MRN [id number]  PCP:  Ronnald Nian, MD  Cardiologist:  Armanda Magic, MD    Referring MD: Ronnald Nian, MD   Chief Complaint  Patient presents with  . Sleep Apnea  . Hypertension  . Atrial Fibrillation    History of Present Illness:    Martin [name] is a 59 y.o. male with a hx of HTN, HLD, hypothyroidism and PAF in setting of acute illness(not on a/c due to [facility] score of 1 and GI bleed).  He was referred for sleep study due to excessive daytime sleepiness and was found to have mild OSA with an AHI of 8.1/hr but severe during REM sleep with AHI 43/hr.  He had O2 desats as low as 85%.  He underwent CPAP titration to 12cm H2O.    He is here today for followup and is doing well.  He denies any chest pain or pressure, SOB, DOE, PND, orthopnea, LE edema, dizziness, palpitations or syncope. He is compliant with his meds and is tolerating meds with no SE.   He has not gotten his PAP device yet and has an appt tomorrow with DME to pick it up.    Past Medical History:  Diagnosis Date  . Cardiomegaly    LVH  . Cataract   . Hemorrhoids   . Hyperlipidemia   . Hypertension   . Hypothyroidism   . Lower GI bleeding   . OSA (obstructive sleep apnea)    mild OSA with an AHI of 8.1/hr but severe during REM sleep with AHI 43/hr.  He had O2 desats as low as 85%.  [name] PAF (paroxysmal atrial fibrillation) (HCC)    in setting of acute cholecystitis.  No anticoagulation due to setting of acute illness and CHADS2VAS score of 1    Past Surgical History:  Procedure Laterality Date  . CHOLECYSTECTOMY N/A 06/01/2019   Procedure: LAPAROSCOPIC CHOLECYSTECTOMY;  Surgeon: Manus Rudd, MD;  Location: [facility] OR;  Service: General;  Laterality: N/A;  . DOPPLER ECHOCARDIOGRAPHY      Current Medications: Current Meds  Medication Sig  . aspirin 81 MG tablet Take 81 mg by mouth daily.    . carvedilol (COREG)  25 MG tablet Take 1 tablet (25 mg total) by mouth 2 (two) times daily.  . cetirizine (ZYRTEC ALLERGY) 10 MG tablet Take 1 tablet (10 mg total) by mouth daily.  [name] diltiazem (CARDIZEM CD) 240 MG 24 hr capsule Take 1 capsule (240 mg total) by mouth daily.  [name] losartan-hydrochlorothiazide (HYZAAR) 100-25 MG tablet Take 1 tablet by mouth daily.  . pravastatin (PRAVACHOL) 40 MG tablet Take 1 tablet (40 mg total) by mouth daily.     Allergies:   Garlic and Shrimp [shellfish allergy]   Social History   Socioeconomic History  . Marital status: Single    Spouse name: Not on file  . Number of children: 1  . Years of education: Not on file  . Highest education level: Not on file  Occupational History  . Occupation: Warden/ranger: [employer]  Tobacco Use  . Smoking status: Never Smoker  . Smokeless tobacco: Never Used  Substance and Sexual Activity  . Alcohol use: Yes    Comment: Rare  . Drug use: No  . Sexual activity: Yes  Other Topics Concern  . Not on file  Social History Narrative  . Not on file   Social Determinants  of Health   Financial Resource Strain:   . Difficulty of Paying Living Expenses:   Food Insecurity:   . Worried About Charity fundraiser in the Last Year:   . Arboriculturist in the Last Year:   Transportation Needs:   . Film/video editor (Medical):   [name] Lack of Transportation (Non-Medical):   Physical Activity:   . Days of Exercise per Week:   . Minutes of Exercise per Session:   Stress:   . Feeling of Stress :   Social Connections:   . Frequency of Communication with Friends and Family:   . Frequency of Social Gatherings with Friends and Family:   . Attends Religious Services:   . Active Member of Clubs or Organizations:   . Attends Archivist Meetings:   [name] Marital Status:      Family History: The patient's family history includes Asthma in his father; Hypertension in his father; Kidney disease in his father and mother.  ROS:     Please see the history of present illness.    ROS  All other systems reviewed and negative.   EKGs/Labs/Other Studies Reviewed:    The following studies were reviewed today: Sleep study, CPAP titration, PAP compliance download  EKG:  EKG is not ordered today.  Recent Labs: 05/30/2019: B Natriuretic Peptide 75.9 05/31/2019: TSH 2.498 06/01/2019: Magnesium 2.8 06/14/2019: ALT 34; Hemoglobin 11.6; Platelets 411 10/20/2019: BUN 16; Creatinine, Ser 1.33; Potassium 3.7; Sodium 142   Recent Lipid Panel    Component Value Date/Time   CHOL 138 10/06/2017 1142   TRIG 85 10/06/2017 1142   HDL 47 10/06/2017 1142   CHOLHDL 2.9 10/06/2017 1142   CHOLHDL 3.0 04/03/2016 0754   VLDL 17 04/03/2016 0754   LDLCALC 74 10/06/2017 1142    Physical Exam:    VS:  BP (!) 148/106   Pulse 73   Ht 6' 1.5" (1.867 m)   Wt 274 lb 9.6 oz (124.6 kg)   SpO2 96%   BMI 35.74 kg/m     Wt Readings from Last 3 Encounters:  12/07/19 274 lb 9.6 oz (124.6 kg)  11/14/19 245 lb (111.1 kg)  09/17/19 263 lb (119.3 kg)     GEN:  Well nourished, well developed in no acute distress HEENT: Normal NECK: No JVD; No carotid bruits LYMPHATICS: No lymphadenopathy CARDIAC: RRR, no murmurs, rubs, gallops RESPIRATORY:  Clear to auscultation without rales, wheezing or rhonchi  ABDOMEN: Soft, non-tender, non-distended MUSCULOSKELETAL:  No edema; No deformity  SKIN: Warm and dry NEUROLOGIC:  Alert and oriented x 3 PSYCHIATRIC:  Normal affect   ASSESSMENT:    1. OSA (obstructive sleep apnea)   2. Essential hypertension   3. PAF (paroxysmal atrial fibrillation) (HCC)    PLAN:    In order of problems listed above:  1.  OSA  -he is picking up his CPAP device tomorrow -he will see me back in 8 weeks to assess his CPAP therapy  2.  HTN -BP poorly controlled today -continue Carvedilol 25mg  BID, Hyzaar 100/12.5mg  daily  -increase Cardizem CD to 300mg  daily -I have asked him to check his BP and HR daily for a  week and call with results.   3.  PAF -occurred in setting of acute illness with acute cholecystitis and converted spontaneously to NSR with CCB and BB. -he has not had any palpitations and is maintaining NSR on exam -no longterm anticoagulation due to [facility] score of 1 and hx of GI  bleed  Medication Adjustments/Labs and Tests Ordered: Current medicines are reviewed at length with the patient today.  Concerns regarding medicines are outlined above.  No orders of the defined types were placed in this encounter.  No orders of the defined types were placed in this encounter.   Signed, Armanda Magic, MD  12/07/2019 8:18 AM    [facility]

## 2019-12-18 ENCOUNTER — Encounter: Payer: Self-pay | Admitting: Family Medicine

## 2019-12-18 ENCOUNTER — Ambulatory Visit (INDEPENDENT_AMBULATORY_CARE_PROVIDER_SITE_OTHER): Payer: BC Managed Care – PPO | Admitting: Family Medicine

## 2019-12-18 ENCOUNTER — Other Ambulatory Visit: Payer: Self-pay

## 2019-12-18 VITALS — BP 162/110 | HR 69 | Temp 97.1°F | Wt 273.0 lb

## 2019-12-18 DIAGNOSIS — I480 Paroxysmal atrial fibrillation: Secondary | ICD-10-CM

## 2019-12-18 DIAGNOSIS — I10 Essential (primary) hypertension: Secondary | ICD-10-CM | POA: Diagnosis not present

## 2019-12-18 DIAGNOSIS — G4733 Obstructive sleep apnea (adult) (pediatric): Secondary | ICD-10-CM | POA: Diagnosis not present

## 2019-12-18 DIAGNOSIS — Z9989 Dependence on other enabling machines and devices: Secondary | ICD-10-CM

## 2019-12-18 NOTE — Patient Instructions (Signed)
I will see you in 2 months and make sure you get a readout about a month before you come in

## 2019-12-18 NOTE — Progress Notes (Signed)
° °  Subjective:    [name], male    DOB: 1960-12-08, 59 y.o.   MRN: [id number]  HPI He is here for follow-up visit.  He is unhappy with the care that he got from cardiology.  He is not sure why he was put on CPAP.  He was placed on it starting June 18.  A readout for the last 10 days was obtained.  He did use it 6 out of 10 days and the last 2 days he had to readjust the straps and did note more energy and waking up refreshed especially the last couple of days.   Review of Systems     Objective:   Physical Exam Alert and in no distress.  Blood pressure is recorded.       Assessment & Plan:  OSA on CPAP  Essential hypertension  PAF (paroxysmal atrial fibrillation) (HCC) I explained the fact that with his underlying hypertension, he could have OSA and the cardiologist ordered the test to assess this.  He does indeed have OSA and presently is on 12 cm of water.  When used effectively he did note an improvement in his symptoms.  I explained that this can also help with his blood pressure however since he is only had 10 days worth of use, difficult to assess a full benefit of that.  I then discussed the atrial for been regard to his underlying hospitalization and treatment and at this time no further intervention done there especially with his history of GI bleed.  He would like me to follow-up on his blood pressure and OSA.  He will return here in 2 months and we will get a readout.  He was comfortable with that.  Over 30 minutes spent discussing all these issues with him.

## 2019-12-29 NOTE — Telephone Encounter (Signed)
Patient has a 10 week follow up appointment scheduled for 02/15/20. Patient understands he needs to keep this appointment for insurance compliance. Patient was grateful for the call and thanked me.

## 2020-01-23 ENCOUNTER — Other Ambulatory Visit: Payer: Self-pay | Admitting: Family Medicine

## 2020-01-23 DIAGNOSIS — E785 Hyperlipidemia, unspecified: Secondary | ICD-10-CM

## 2020-02-12 ENCOUNTER — Ambulatory Visit: Payer: BC Managed Care – PPO | Admitting: Family Medicine

## 2020-02-15 ENCOUNTER — Telehealth: Payer: BC Managed Care – PPO | Admitting: Cardiology

## 2020-02-15 ENCOUNTER — Other Ambulatory Visit: Payer: Self-pay

## 2020-02-18 NOTE — Progress Notes (Signed)
Virtual Visit via Telephone Note   This visit type was conducted due to national recommendations for restrictions regarding the COVID-19 Pandemic (e.g. social distancing) in an effort to limit this patient's exposure and mitigate transmission in our community.  Due to his co-morbid illnesses, this patient is at least at moderate risk for complications without adequate follow up.  This format is felt to be most appropriate for this patient at this time.  The patient did not have access to video technology/had technical difficulties with video requiring transitioning to audio format only (telephone).  All issues noted in this document were discussed and addressed.  No physical exam could be performed with this format.  Please refer to the patient's chart for his  consent to telehealth for [facility].  Evaluation Performed:  Follow-up visit  This visit type was conducted due to national recommendations for restrictions regarding the COVID-19 Pandemic (e.g. social distancing).  This format is felt to be most appropriate for this patient at this time.  All issues noted in this document were discussed and addressed.  No physical exam was performed (except for noted visual exam findings with Video Visits).  Please refer to the patient's chart (MyChart message for video visits and phone note for telephone visits) for the patient's consent to telehealth for [facility].  Date:  02/19/2020   ID:  Martin [name], DOB 1960-06-30, MRN [id number]  Patient Location:  Home  Provider location:   [place]  PCP:  Ronnald Nian, MD  Cardiologist:  Armanda Magic, MD  Electrophysiologist:  None   Chief Complaint:  OSA, HTN, PAF  History of Present Illness:    Martin [name] is a 59 y.o. male who presents via audio/video conferencing for a telehealth visit today.    Martin [name] is a 59 y.o. male with a hx of HTN, HLD, hypothyroidism and PAF in setting of acute illness(not on a/c due to  [facility] score of 1 and GI bleed).  He was referred for sleep study due to excessive daytime sleepiness and was found to have mild OSA with an AHI of 8.1/hr but severe during REM sleep with AHI 43/hr.  He had O2 desats as low as 85%.  He underwent CPAP titration to 12cm H2O.    He is here today for followup and is doing well.  He denies any chest pain or pressure, SOB, DOE, PND, orthopnea, dizziness, palpitations or syncope. He has had some LE edema but his diet is very poor and eats a lot of fast food and high Na foods.  He is compliant with his meds and is tolerating meds with no SE.  He is doing well with his CPAP device and thinks that He has gotten used to it.  He tolerates the mask and feels the pressure is adequate.  Since going on CPAP he feels rested in the am and has no significant daytime sleepiness.  He denies any significant mouth or nasal dryness or nasal congestion.  He does not think that he snores.  He has not used his PAP much recently due to having shoulder surgery and having a lot of pain.   The patient does not have symptoms concerning for COVID-19 infection (fever, chills, cough, or new shortness of breath).    Prior CV studies:   The following studies were reviewed today:  PAP compliance download  Past Medical History:  Diagnosis Date  . Cardiomegaly    LVH  . Cataract   . Hemorrhoids   .  Hyperlipidemia   . Hypertension   . Hypothyroidism   . Lower GI bleeding   . OSA (obstructive sleep apnea)    mild OSA with an AHI of 8.1/hr but severe during REM sleep with AHI 43/hr.  He had O2 desats as low as 85%.  [name] PAF (paroxysmal atrial fibrillation) (HCC)    in setting of acute cholecystitis.  No anticoagulation due to setting of acute illness and CHADS2VAS score of 1   Past Surgical History:  Procedure Laterality Date  . CHOLECYSTECTOMY N/A 06/01/2019   Procedure: LAPAROSCOPIC CHOLECYSTECTOMY;  Surgeon: Manus Rudd, MD;  Location: [facility] OR;  Service: General;   Laterality: N/A;  . DOPPLER ECHOCARDIOGRAPHY       Current Meds  Medication Sig  . aspirin 81 MG tablet Take 81 mg by mouth daily.    . carvedilol (COREG) 25 MG tablet Take 1 tablet (25 mg total) by mouth 2 (two) times daily.  . cetirizine (ZYRTEC ALLERGY) 10 MG tablet Take 1 tablet (10 mg total) by mouth daily.  [name] diltiazem (CARDIZEM CD) 300 MG 24 hr capsule Take 1 capsule (300 mg total) by mouth daily.  [name] losartan-hydrochlorothiazide (HYZAAR) 100-25 MG tablet Take 1 tablet by mouth daily.  . pravastatin (PRAVACHOL) 40 MG tablet Take 1 tablet by mouth once daily     Allergies:   Garlic and Shrimp [shellfish allergy]   Social History   Tobacco Use  . Smoking status: Never Smoker  . Smokeless tobacco: Never Used  Substance Use Topics  . Alcohol use: Yes    Comment: Rare  . Drug use: No     Family Hx: The patient's family history includes Asthma in his father; Hypertension in his father; Kidney disease in his father and mother.  ROS:   Please see the history of present illness.     All other systems reviewed and are negative.   Labs/Other Tests and Data Reviewed:    Recent Labs: 05/30/2019: B Natriuretic Peptide 75.9 05/31/2019: TSH 2.498 06/01/2019: Magnesium 2.8 06/14/2019: ALT 34; Hemoglobin 11.6; Platelets 411 10/20/2019: BUN 16; Creatinine, Ser 1.33; Potassium 3.7; Sodium 142   Recent Lipid Panel Lab Results  Component Value Date/Time   CHOL 138 10/06/2017 11:42 AM   TRIG 85 10/06/2017 11:42 AM   HDL 47 10/06/2017 11:42 AM   CHOLHDL 2.9 10/06/2017 11:42 AM   CHOLHDL 3.0 04/03/2016 07:54 AM   LDLCALC 74 10/06/2017 11:42 AM    Wt Readings from Last 3 Encounters:  02/19/20 255 lb (115.7 kg)  12/18/19 273 lb (123.8 kg)  12/07/19 274 lb 9.6 oz (124.6 kg)     Objective:    Vital Signs:  Ht 6\' 1"  (1.854 m)   Wt 255 lb (115.7 kg)   BMI 33.64 kg/m      ASSESSMENT & PLAN:    1. OSA -  The patient is tolerating PAP therapy well but has problems with  sweating at night which makes the mask hard to stay on.  He also recently had shoulder surgery and is in a lot of pain and has not been sleeping well and not using his device. The PAP download was reviewed today and showed an AHI of 0.2/hr on 12 cm H2O with 0% compliance in using more than 4 hours nightly.  The patient has been using and benefiting from PAP use and will continue to benefit from therapy.  -I have encouraged him to try to be more compliant with his device  2.  HTN -He tells  me that his BP at home runs 175/123mmHg -he eats a lot of frozen dinners and also eats a lot of fast food -encouraged him to try to limit Na to < 2gm daily -continue Carvedilol 25mg  BID, Cardizem CD 300mg  daily and stop Hyzaar 100-25mg  daily.  -start Losartan 100mg  daily and add chlorthalidone 25mg  daily and check BMET in 1 week -HTN clinic with PharmD and BMET in 1 week -will send him a brochure on low Na diet -refer to healthy weight and wellness program  3.  PAF -occurred in setting of acute illness with acute cholecystitis and converted spontaneously to NSR with CCB and BB. -he has not had any palpitations and is maintaining NSR on exam -no longterm anticoagulation due to CHADS2VASC score of 1 and hx of GI bleed  COVID-19 Education: The signs and symptoms of COVID-19 were discussed with the patient and how to seek care for testing (follow up with PCP or arrange E-visit).  The importance of social distancing was discussed today.  Patient Risk:   After full review of this patient's clinical status, I feel that they are at least moderate risk at this time.  Time:   Today, I have spent 20 minutes on telemedicine discussing medical problems including OSA, HTN, PAF and reviewing patient's chart including PAP compliance download.  Medication Adjustments/Labs and Tests Ordered: Current medicines are reviewed at length with the patient today.  Concerns regarding medicines are outlined above.  Tests  Ordered: No orders of the defined types were placed in this encounter.  Medication Changes: No orders of the defined types were placed in this encounter.   Disposition:  Follow up in HTN with PharmD with BMET and PA in 4 weeks  Signed, , MD  02/19/2020 9:51 AM    [facility]

## 2020-02-19 ENCOUNTER — Encounter: Payer: Self-pay | Admitting: Cardiology

## 2020-02-19 ENCOUNTER — Telehealth (INDEPENDENT_AMBULATORY_CARE_PROVIDER_SITE_OTHER): Payer: BC Managed Care – PPO | Admitting: Cardiology

## 2020-02-19 ENCOUNTER — Other Ambulatory Visit: Payer: Self-pay

## 2020-02-19 VITALS — Ht 73.0 in | Wt 255.0 lb

## 2020-02-19 DIAGNOSIS — I480 Paroxysmal atrial fibrillation: Secondary | ICD-10-CM | POA: Diagnosis not present

## 2020-02-19 DIAGNOSIS — I10 Essential (primary) hypertension: Secondary | ICD-10-CM | POA: Diagnosis not present

## 2020-02-19 DIAGNOSIS — G4733 Obstructive sleep apnea (adult) (pediatric): Secondary | ICD-10-CM | POA: Diagnosis not present

## 2020-02-19 MED ORDER — CHLORTHALIDONE 25 MG PO TABS: 25.0000 mg | ORAL_TABLET | Freq: Every day | ORAL | 6 refills | Status: DC

## 2020-02-19 MED ORDER — LOSARTAN POTASSIUM 100 MG PO TABS: 100.0000 mg | ORAL_TABLET | Freq: Every day | ORAL | 6 refills | Status: DC

## 2020-02-19 NOTE — Patient Instructions (Addendum)
Medication Instructions:  Your physician has recommended you make the following change in your medication:  Stop losartan/HCTZ (hyzaar) Start losartan 100 mg daily. Start chlorthalidone 25 mg daily.    *If you need a refill on your cardiac medications before your next appointment, please call your pharmacy*   Lab Work: Your physician recommends that you return for lab work on September 7,2021 at time of appointment with pharmacist  (BMP)  If you have labs (blood work) drawn today and your tests are completely normal, you will receive your results only by:  MyChart Message (if you have MyChart) OR  A paper copy in the mail If you have any lab test that is abnormal or we need to change your treatment, we will call you to review the results.   Testing/Procedures: You have been referred to [facility]    Follow-Up: At [facility], you and your health needs are our priority.  As part of our continuing mission to provide you with exceptional heart care, we have created designated Provider Care Teams.  These Care Teams include your primary Cardiologist (physician) and Advanced Practice Providers (APPs -  Physician Assistants and Nurse Practitioners) who all work together to provide you with the care you need, when you need it.  We recommend signing up for the patient portal called "MyChart".  Sign up information is provided on this After Visit Summary.  MyChart is used to connect with patients for Virtual Visits (Telemedicine).  Patients are able to view lab/test results, encounter notes, upcoming appointments, etc.  Non-urgent messages can be sent to your provider as well.   To learn more about what you can do with MyChart, go to [email or web address].        You are scheduled to see the pharmacist on September 7,2021 at 4:00.  Lab work will also be done.  You are scheduled to see Tereso Newcomer PA on September 28,2021 at 9:45.  Your physician wants you to follow-up  in:6 months with Dr Mayford Knife. You will receive a reminder letter in the mail two months in advance. If you don't receive a letter, please call our office to schedule the follow-up appointment.   Low-Sodium Eating Plan Sodium, which is an element that makes up salt, helps you maintain a healthy balance of fluids in your body. Too much sodium can increase your blood pressure and cause fluid and waste to be held in your body. Your health care provider or dietitian may recommend following this plan if you have high blood pressure (hypertension), kidney disease, liver disease, or heart failure. Eating less sodium can help lower your blood pressure, reduce swelling, and protect your heart, liver, and kidneys. What are tips for following this plan? General guidelines  Most people on this plan should limit their sodium intake to 1,500-2,000 mg (milligrams) of sodium each day. Reading food labels   The Nutrition Facts label lists the amount of sodium in one serving of the food. If you eat more than one serving, you must multiply the listed amount of sodium by the number of servings.  Choose foods with less than 140 mg of sodium per serving.  Avoid foods with 300 mg of sodium or more per serving. Shopping  Look for lower-sodium products, often labeled as "low-sodium" or "no salt added."  Always check the sodium content even if foods are labeled as "unsalted" or "no salt added".  Buy fresh foods. ? Avoid canned foods and premade or frozen meals. ? Avoid canned, cured,  or processed meats  Buy breads that have less than 80 mg of sodium per slice. Cooking  Eat more home-cooked food and less restaurant, buffet, and fast food.  Avoid adding salt when cooking. Use salt-free seasonings or herbs instead of table salt or sea salt. Check with your health care provider or pharmacist before using salt substitutes.  Cook with plant-based oils, such as canola, sunflower, or olive oil. Meal planning  When  eating at a restaurant, ask that your food be prepared with less salt or no salt, if possible.  Avoid foods that contain MSG (monosodium glutamate). MSG is sometimes added to [place] food, bouillon, and some canned foods. What foods are recommended? The items listed may not be a complete list. Talk with your dietitian about what dietary choices are best for you. Grains Low-sodium cereals, including oats, puffed wheat and rice, and shredded wheat. Low-sodium crackers. Unsalted rice. Unsalted pasta. Low-sodium bread. Whole-grain breads and whole-grain pasta. Vegetables Fresh or frozen vegetables. "No salt added" canned vegetables. "No salt added" tomato sauce and paste. Low-sodium or reduced-sodium tomato and vegetable juice. Fruits Fresh, frozen, or canned fruit. Fruit juice. Meats and other protein foods Fresh or frozen (no salt added) meat, poultry, seafood, and fish. Low-sodium canned tuna and salmon. Unsalted nuts. Dried peas, beans, and lentils without added salt. Unsalted canned beans. Eggs. Unsalted nut butters. Dairy Milk. Soy milk. Cheese that is naturally low in sodium, such as ricotta cheese, fresh mozzarella, or Swiss cheese Low-sodium or reduced-sodium cheese. Cream cheese. Yogurt. Fats and oils Unsalted butter. Unsalted margarine with no trans fat. Vegetable oils such as canola or olive oils. Seasonings and other foods Fresh and dried herbs and spices. Salt-free seasonings. Low-sodium mustard and ketchup. Sodium-free salad dressing. Sodium-free light mayonnaise. Fresh or refrigerated horseradish. Lemon juice. Vinegar. Homemade, reduced-sodium, or low-sodium soups. Unsalted popcorn and pretzels. Low-salt or salt-free chips. What foods are not recommended? The items listed may not be a complete list. Talk with your dietitian about what dietary choices are best for you. Grains Instant hot cereals. Bread stuffing, pancake, and biscuit mixes. Croutons. Seasoned rice or pasta mixes.  Noodle soup cups. Boxed or frozen macaroni and cheese. Regular salted crackers. Self-rising flour. Vegetables Sauerkraut, pickled vegetables, and relishes. Olives. [place] fries. Onion rings. Regular canned vegetables (not low-sodium or reduced-sodium). Regular canned tomato sauce and paste (not low-sodium or reduced-sodium). Regular tomato and vegetable juice (not low-sodium or reduced-sodium). Frozen vegetables in sauces. Meats and other protein foods Meat or fish that is salted, canned, smoked, spiced, or pickled. Bacon, ham, sausage, hotdogs, corned beef, chipped beef, packaged lunch meats, salt pork, jerky, pickled herring, anchovies, regular canned tuna, sardines, salted nuts. Dairy Processed cheese and cheese spreads. Cheese curds. Blue cheese. Feta cheese. String cheese. Regular cottage cheese. Buttermilk. Canned milk. Fats and oils Salted butter. Regular margarine. Ghee. Bacon fat. Seasonings and other foods Onion salt, garlic salt, seasoned salt, table salt, and sea salt. Canned and packaged gravies. Worcestershire sauce. Tartar sauce. Barbecue sauce. Teriyaki sauce. Soy sauce, including reduced-sodium. Steak sauce. Fish sauce. Oyster sauce. Cocktail sauce. Horseradish that you find on the shelf. Regular ketchup and mustard. Meat flavorings and tenderizers. Bouillon cubes. Hot sauce and Tabasco sauce. Premade or packaged marinades. Premade or packaged taco seasonings. Relishes. Regular salad dressings. Salsa. Potato and tortilla chips. Corn chips and puffs. Salted popcorn and pretzels. Canned or dried soups. Pizza. Frozen entrees and pot pies. Summary  Eating less sodium can help lower your blood pressure, reduce swelling, and  protect your heart, liver, and kidneys.  Most people on this plan should limit their sodium intake to 1,500-2,000 mg (milligrams) of sodium each day.  Canned, boxed, and frozen foods are high in sodium. Restaurant foods, fast foods, and pizza are also very high in  sodium. You also get sodium by adding salt to food.  Try to cook at home, eat more fresh fruits and vegetables, and eat less fast food, canned, processed, or prepared foods. This information is not intended to replace advice given to you by your health care provider. Make sure you discuss any questions you have with your health care provider. Document Revised: 05/21/2017 Document Reviewed: 06/01/2016 Elsevier Patient Education  2020 [employer].

## 2020-02-19 NOTE — Addendum Note (Signed)
Addended by: Dossie Arbour on: 02/19/2020 11:47 AM   Modules accepted: Orders

## 2020-02-24 ENCOUNTER — Encounter (INDEPENDENT_AMBULATORY_CARE_PROVIDER_SITE_OTHER): Payer: Self-pay

## 2020-02-27 ENCOUNTER — Other Ambulatory Visit: Payer: BC Managed Care – PPO | Admitting: *Deleted

## 2020-02-27 ENCOUNTER — Ambulatory Visit (INDEPENDENT_AMBULATORY_CARE_PROVIDER_SITE_OTHER): Payer: BC Managed Care – PPO | Admitting: Pharmacist

## 2020-02-27 ENCOUNTER — Ambulatory Visit: Payer: BC Managed Care – PPO | Admitting: Family Medicine

## 2020-02-27 ENCOUNTER — Other Ambulatory Visit: Payer: Self-pay

## 2020-02-27 VITALS — BP 160/110 | HR 70 | Wt 272.4 lb

## 2020-02-27 DIAGNOSIS — I10 Essential (primary) hypertension: Secondary | ICD-10-CM

## 2020-02-27 DIAGNOSIS — I480 Paroxysmal atrial fibrillation: Secondary | ICD-10-CM

## 2020-02-27 NOTE — Patient Instructions (Addendum)
It was good seeing you today.  Your blood pressure goal is <130/80  Continue your carvedilol 25mg  BID, losartan 100mg  daily, chlorthalidone 25 mg in the morning, and diltiazem 300 mg daily  I will contact you when your lab work results come back and we can decide the next steps  Remember to limit your salt intake and exercise as much as tolerated  Try to wear your CPAP nightly  Call with any questions!  , PharmD, BCACP, CDCES [facility] [address] [place], [address] [place] Phone: [phone]; Fax: [phone] 02/27/2020 4:34 PM

## 2020-02-27 NOTE — Progress Notes (Signed)
Patient IDZebulen [name]                 DOB: Jul 04, 1960                      MRN: [id number]     HPI: Martin [name] is a 59 y.o. male referred by Dr. Mayford Knife to HTN clinic. PMH is significant for PAF, LVH, HTN. HLD, and OSA.  Patient seen by Dr Mayford Knife on 8/30 for OSA appointment.  Has not been wearing his CPAP often since he had rotator cuff surgery and is unable to sleep on his left side.  Dr Mayford Knife started patient on chlorthalidone 25 and losartan 100mg  daily.  Has taken medications now for 3 days.  Reports his home readings are typically in 170s but did not bring log or machine.  Reports he has been following a low salt diet but also reports eating out frequently.  BMP results from today are pending.  Current HTN meds: carvedilol 25 mg BIG, chlorthalidone 25 mg daily, diltiazem 300 mg daily, losartan 100mg  daily Previously tried: amlodipine 10 mg, diltiazem 120, 180mg , HCTZ 25 mg, losartan HCTZ 100/12.5 and 100/25, metoprolol 50 mg BID, Toprol XL 100mg , olmesartan/amlodipine/hctz 40/10/25 daily,  BP goal: <130/80  Wt Readings from Last 3 Encounters:  02/19/20 255 lb (115.7 kg)  12/18/19 273 lb (123.8 kg)  12/07/19 274 lb 9.6 oz (124.6 kg)   BP Readings from Last 3 Encounters:  12/18/19 (!) 162/110  12/07/19 (!) 148/106  10/20/19 (!) 178/116   Pulse Readings from Last 3 Encounters:  12/18/19 69  12/07/19 73  10/20/19 70    Renal function: CrCl cannot be calculated (Patient's most recent lab result is older than the maximum 21 days allowed.).  Past Medical History:  Diagnosis Date  . Cardiomegaly    LVH  . Cataract   . Hemorrhoids   . Hyperlipidemia   . Hypertension   . Hypothyroidism   . Lower GI bleeding   . OSA (obstructive sleep apnea)    mild OSA with an AHI of 8.1/hr but severe during REM sleep with AHI 43/hr.  He had O2 desats as low as 85%.  10/22/19 PAF (paroxysmal atrial fibrillation) (HCC)    in setting of acute cholecystitis.  No anticoagulation due to  setting of acute illness and CHADS2VAS score of 1    Current Outpatient Medications on File Prior to Visit  Medication Sig Dispense Refill  . aspirin 81 MG tablet Take 81 mg by mouth daily.      . carvedilol (COREG) 25 MG tablet Take 1 tablet (25 mg total) by mouth 2 (two) times daily. 180 tablet 3  . cetirizine (ZYRTEC ALLERGY) 10 MG tablet Take 1 tablet (10 mg total) by mouth daily. 30 tablet 0  . chlorthalidone (HYGROTON) 25 MG tablet Take 1 tablet (25 mg total) by mouth daily. 30 tablet 6  . diltiazem (CARDIZEM CD) 300 MG 24 hr capsule Take 1 capsule (300 mg total) by mouth daily. 90 capsule 3  . losartan (COZAAR) 100 MG tablet Take 1 tablet (100 mg total) by mouth daily. 30 tablet 6  . pravastatin (PRAVACHOL) 40 MG tablet Take 1 tablet by mouth once daily 90 tablet 0   No current facility-administered medications on file prior to visit.    Allergies  Allergen Reactions  . Garlic Swelling  . Shrimp [Shellfish Allergy] Swelling     Assessment/Plan: Patient BP in room today 160/110 which is above goal of <  130/80 although patient believes this is improved from his home readings.  Has only been on new medications for 3 days however.  Recommended patient attempt to use his CPAP mask at night and printed patient information regarding the link between HTN and sleep apnea.  While patient reports a low sodium diet, it is likely he is eating more sodium than he thinks due to frequent meals out.    BMP pending.  Recommended patient continue on current medication regimen and monitoring BP readings at home.  Will contact patient when BMP results come back and consider increasing chlorthalidone, or starting hydralazine or spironolactone.  Patient voiced understanding.  Recheck in 1 month.  Laural Golden, PharmD, BCACP, CDCES [facility] [address], [place], [place] [address] Phone: [phone]; Fax: [phone] 02/27/2020 5:04 PM

## 2020-02-28 ENCOUNTER — Telehealth: Payer: Self-pay | Admitting: Pharmacist

## 2020-02-28 LAB — BASIC METABOLIC PANEL
BUN/Creatinine Ratio: 16 (ref 10–24)
BUN: 21 mg/dL (ref 8–27)
CO2: 29 mmol/L (ref 20–29)
Calcium: 10.1 mg/dL (ref 8.6–10.2)
Chloride: 102 mmol/L (ref 96–106)
Creatinine, Ser: 1.34 mg/dL — ABNORMAL HIGH (ref 0.76–1.27)
GFR calc Af Amer: 66 mL/min/{1.73_m2} (ref >59)
GFR calc non Af Amer: 57 mL/min/{1.73_m2} — ABNORMAL LOW (ref >59)
Glucose: 129 mg/dL — ABNORMAL HIGH (ref 65–99)
Potassium: 3.8 mmol/L (ref 3.5–5.2)
Sodium: 142 mmol/L (ref 134–144)

## 2020-02-28 NOTE — Telephone Encounter (Signed)
Spoke with patient over the phone.  BMP stable from 4 months ago.  Recommended patient continue current medications and to call tomorrow with BP results since he has not taken it yet today

## 2020-03-12 ENCOUNTER — Ambulatory Visit: Payer: BC Managed Care – PPO | Admitting: Family Medicine

## 2020-03-18 NOTE — Progress Notes (Signed)
Cardiology Office Note:    Date:  03/19/2020   ID:  Martin [name], DOB [birth year], MRN [id number]  PCP:  [name], MD  [facility] Cardiologist:  Fransico Him, MD   [facility] Electrophysiologist:  None   Referring MD: [name], MD   Chief Complaint:  Follow-up (HTN)    Patient Profile:    Martin [name] is a 59 y.o. male with:   Hypertension   Hyperlipidemia   Hypothyroidism   Paroxysmal atrial fibrillation   In setting of acute cholecystitis; no anticoagulation - caused by acute illness  CHA2DS2-VASc=1 (HTN)    OSA, on CPAP  Hx of LGI bleeding   Prior CV studies: Echocardiogram 05/31/2019 EF 55-60, mild LVH, no RWMA, normal RVSF, mild MAC, trivial MR, trivial TR   History of Present Illness:    Martin [name] was last seen by [name] via telemedicine 02/19/2020.  His blood pressure is uncontrolled.  Medications were adjusted.  He was seen in the hypertension clinic 02/27/2020.  Pressure was still uncontrolled.  However, he had only been on his new medical regimen for 3 days.  No changes were made.  He returns for follow-up.    He is here alone.  His CPAP was taken back by the [facility] agency due to non-compliance.  He notes he had a hard time wearing it.  It would fall off and he felt like it did not fit correctly.  He has not had chest pain, shortness of breath, headaches, orthopnea, syncope.  He has come occasional ankle swelling.        Past Medical History:  Diagnosis Date  . Cardiomegaly    LVH  . Cataract   . Hemorrhoids   . Hyperlipidemia   . Hypertension   . Hypothyroidism   . Lower GI bleeding   . OSA (obstructive sleep apnea)    mild OSA with an AHI of 8.1/hr but severe during REM sleep with AHI 43/hr.  He had O2 desats as low as 85%.  [name] PAF (paroxysmal atrial fibrillation) ([place])    in setting of acute cholecystitis.  No anticoagulation due to setting of acute illness and CHADS2VAS score of 1    Current Medications: Current  Meds  Medication Sig  . aspirin 81 MG tablet Take 81 mg by mouth daily.    . carvedilol (COREG) 25 MG tablet Take 1 tablet (25 mg total) by mouth 2 (two) times daily.  . chlorthalidone (HYGROTON) 25 MG tablet Take 1 tablet (25 mg total) by mouth daily.  [name] diltiazem (CARDIZEM CD) 360 MG 24 hr capsule Take 1 capsule (360 mg total) by mouth daily.  [name] HYDROcodone-acetaminophen (NORCO/VICODIN) 5-325 MG tablet Take 1 tablet by mouth 2 (two) times daily as needed.  [name] losartan (COZAAR) 100 MG tablet Take 1 tablet (100 mg total) by mouth daily.  . pravastatin (PRAVACHOL) 40 MG tablet Take 1 tablet by mouth once daily  . [DISCONTINUED] diltiazem (CARDIZEM CD) 300 MG 24 hr capsule Take 1 capsule (300 mg total) by mouth daily.     Allergies:   Garlic and Shrimp [shellfish allergy]   Social History   Tobacco Use  . Smoking status: Never Smoker  . Smokeless tobacco: Never Used  Substance Use Topics  . Alcohol use: Yes    Comment: Rare  . Drug use: No     Family Hx: The patient's family history includes Asthma in his father; Hypertension in his father; Kidney disease in his father and mother.  ROS   EKGs/Labs/Other Test Reviewed:    EKG:  EKG is   ordered today.  The ekg ordered today demonstrates normal sinus rhythm HR 75, normal axis, TW inversions 3, aVF, V3-6, QTC 455  Recent Labs: 05/30/2019: B Natriuretic Peptide 75.9 05/31/2019: TSH 2.498 06/01/2019: Magnesium 2.8 06/14/2019: ALT 34; Hemoglobin 11.6; Platelets 411 02/27/2020: BUN 21; Creatinine, Ser 1.34; Potassium 3.8; Sodium 142   Recent Lipid Panel Lab Results  Component Value Date/Time   CHOL 138 10/06/2017 11:42 AM   TRIG 85 10/06/2017 11:42 AM   HDL 47 10/06/2017 11:42 AM   CHOLHDL 2.9 10/06/2017 11:42 AM   CHOLHDL 3.0 04/03/2016 07:54 AM   LDLCALC 74 10/06/2017 11:42 AM    Physical Exam:    VS:  BP (!) 144/90   Pulse 75   Ht _0  (1.854 m)   Wt 274 lb (124.3 kg)   SpO2 93%   BMI 36.15 kg/m     Wt Readings  from Last 3 Encounters:  03/19/20 274 lb (124.3 kg)  02/27/20 272 lb 6.4 oz (123.6 kg)  02/19/20 255 lb (115.7 kg)     Constitutional:      Appearance: Healthy appearance. Not in distress.  Neck:     Vascular: JVD normal.  Pulmonary:     Effort: Pulmonary effort is normal.     Breath sounds: No wheezing. No rales.  Cardiovascular:     Normal rate. Regular rhythm. Normal S1. Normal S2.     Murmurs: There is no murmur.  Edema:    Peripheral edema absent.  Abdominal:     Palpations: Abdomen is soft.  Skin:    General: Skin is warm and dry.  Neurological:     General: No focal deficit present.     Mental Status: Alert and oriented to person, place and time.     Cranial Nerves: Cranial nerves are intact.       ASSESSMENT & PLAN:    1. Essential hypertension His BP is closer to goal.  Options for management are getting to be somewhat limited.  He is on max dose of Carvedilol and Losartan.  I would not increase the Chlorthalidone any more.  Creatinine is mildly elevated.  He feels he can do better with his diet and limiting his salt.  He tries to eat out once a week.  I recommended that he eat out less than that.  He takes a pain med from the [place] for his shoulder. I asked him to check to see if he is taking an NSAID.  If so, he should get an alternative med from the [place].  I will follow up with [name] to see if we can do anything else to help him get back on his CPAP.  We discussed the importance of this to help manage his hypertension.  I will increase his Diltiazem to 360 mg once daily.  He will follow up in 4 weeks.  If his BP is not to goal, we can try him on low dose Spironolactone with very close f/u on his renal function vs adding clonidine.  He does not think he can take a med 3 x a day, so I would avoid Hydralazine for now.  He also is considering PDE-5 inhibitors for ED.  Therefore, I would avoid nitrates.  Of note, his ECG does demonstrate TW inversions.  This looks to be likely  LVH with repol abnormality.  He has not had anginal symptoms.  Obtain repeat BMET today.  2. PAF (paroxysmal atrial fibrillation) ([place]) This occurred in the context of an acute illness.  He is maintaining normal sinus rhythm.  He is not on anticoagulation.    3. OSA (obstructive sleep apnea) I will follow up with [name] to see if there is anything that can be done to help with his CPAP compliance.      Dispo:  Return in about 4 weeks (around 04/16/2020) for Routine Follow Up w/ Dr Radford Pax, or Richardson Dopp, PA-C, in person.   Medication Adjustments/Labs and Tests Ordered: Current medicines are reviewed at length with the patient today.  Concerns regarding medicines are outlined above.  Tests Ordered: Orders Placed This Encounter  Procedures  . Basic metabolic panel  . EKG 12-Lead   Medication Changes: Meds ordered this encounter  Medications  . diltiazem (CARDIZEM CD) 360 MG 24 hr capsule    Sig: Take 1 capsule (360 mg total) by mouth daily.    Dispense:  90 capsule    Refill:  1    Signed, Richardson Dopp, PA-C  03/19/2020 10:40 AM    [facility] [address], [place], [place]  [address] Phone: [phone]; Fax: [phone]

## 2020-03-19 ENCOUNTER — Ambulatory Visit (INDEPENDENT_AMBULATORY_CARE_PROVIDER_SITE_OTHER): Payer: BC Managed Care – PPO | Admitting: Physician Assistant

## 2020-03-19 ENCOUNTER — Encounter: Payer: Self-pay | Admitting: Physician Assistant

## 2020-03-19 ENCOUNTER — Other Ambulatory Visit: Payer: Self-pay

## 2020-03-19 VITALS — BP 144/90 | HR 75 | Ht 73.0 in | Wt 274.0 lb

## 2020-03-19 DIAGNOSIS — I480 Paroxysmal atrial fibrillation: Secondary | ICD-10-CM | POA: Diagnosis not present

## 2020-03-19 DIAGNOSIS — G4733 Obstructive sleep apnea (adult) (pediatric): Secondary | ICD-10-CM

## 2020-03-19 DIAGNOSIS — I10 Essential (primary) hypertension: Secondary | ICD-10-CM | POA: Diagnosis not present

## 2020-03-19 LAB — BASIC METABOLIC PANEL
BUN/Creatinine Ratio: 15 (ref 10–24)
BUN: 20 mg/dL (ref 8–27)
CO2: 27 mmol/L (ref 20–29)
Calcium: 9.8 mg/dL (ref 8.6–10.2)
Chloride: 100 mmol/L (ref 96–106)
Creatinine, Ser: 1.36 mg/dL — ABNORMAL HIGH (ref 0.76–1.27)
GFR calc Af Amer: 64 mL/min/{1.73_m2} (ref >59)
GFR calc non Af Amer: 56 mL/min/{1.73_m2} — ABNORMAL LOW (ref >59)
Glucose: 101 mg/dL — ABNORMAL HIGH (ref 65–99)
Potassium: 3.7 mmol/L (ref 3.5–5.2)
Sodium: 141 mmol/L (ref 134–144)

## 2020-03-19 MED ORDER — DILTIAZEM HCL ER COATED BEADS 360 MG PO CP24: 360.0000 mg | ORAL_CAPSULE | Freq: Every day | ORAL | 1 refills | Status: DC

## 2020-03-19 NOTE — Patient Instructions (Signed)
Medication Instructions:  Your physician has recommended you make the following change in your medication:   1) Increase Diltiazem to 360 mg, 1 capsule by mouth once a day  *If you need a refill on your cardiac medications before your next appointment, please call your pharmacy*  Lab Work: You will have labs drawn today: BMET  Testing/Procedures: None ordered today  Follow-Up: On 04/19/20 at 11:45AM with Tereso Newcomer, PA-C  Other Instructions Avoid NSAID's (Advil, Aleve, Ibuprofen, Motrin)   Two Gram Sodium Diet 2000 mg  What is Sodium? Sodium is a mineral found naturally in many foods. The most significant source of sodium in the diet is table salt, which is about 40% sodium.  Processed, convenience, and preserved foods also contain a large amount of sodium.  The body needs only 500 mg of sodium daily to function,  A normal diet provides more than enough sodium even if you do not use salt.  Why Limit Sodium? A build up of sodium in the body can cause thirst, increased blood pressure, shortness of breath, and water retention.  Decreasing sodium in the diet can reduce edema and risk of heart attack or stroke associated with high blood pressure.  Keep in mind that there are many other factors involved in these health problems.  Heredity, obesity, lack of exercise, cigarette smoking, stress and what you eat all play a role.  General Guidelines:  Do not add salt at the table or in cooking.  One teaspoon of salt contains over 2 grams of sodium.  Read food labels  Avoid processed and convenience foods  Ask your dietitian before eating any foods not dicussed in the menu planning guidelines  Consult your physician if you wish to use a salt substitute or a sodium containing medication such as antacids.  Limit milk and milk products to 16 oz (2 cups) per day.  Shopping Hints:  READ LABELS!! "Dietetic" does not necessarily mean low sodium.  Salt and other sodium ingredients are often  added to foods during processing.   Menu Planning Guidelines Food Group Choose More Often Avoid  Beverages (see also the milk group All fruit juices, low-sodium, salt-free vegetables juices, low-sodium carbonated beverages Regular vegetable or tomato juices, commercially softened water used for drinking or cooking  Breads and Cereals Enriched white, wheat, rye and pumpernickel bread, hard rolls and dinner rolls; muffins, cornbread and waffles; most dry cereals, cooked cereal without added salt; unsalted crackers and breadsticks; low sodium or homemade bread crumbs Bread, rolls and crackers with salted tops; quick breads; instant hot cereals; pancakes; commercial bread stuffing; self-rising flower and biscuit mixes; regular bread crumbs or cracker crumbs  Desserts and Sweets Desserts and sweets mad with mild should be within allowance Instant pudding mixes and cake mixes  Fats Butter or margarine; vegetable oils; unsalted salad dressings, regular salad dressings limited to 1 Tbs; light, sour and heavy cream Regular salad dressings containing bacon fat, bacon bits, and salt pork; snack dips made with instant soup mixes or processed cheese; salted nuts  Fruits Most fresh, frozen and canned fruits Fruits processed with salt or sodium-containing ingredient (some dried fruits are processed with sodium sulfites        Vegetables Fresh, frozen vegetables and low- sodium canned vegetables Regular canned vegetables, sauerkraut, pickled vegetables, and others prepared in brine; frozen vegetables in sauces; vegetables seasoned with ham, bacon or salt pork  Condiments, Sauces, Miscellaneous  Salt substitute with physician's approval; pepper, herbs, spices; vinegar, lemon or lime juice; hot  pepper sauce; garlic powder, onion powder, low sodium soy sauce (1 Tbs.); low sodium condiments (ketchup, chili sauce, mustard) in limited amounts (1 tsp.) fresh ground horseradish; unsalted tortilla chips, pretzels, potato  chips, popcorn, salsa (1/4 cup) Any seasoning made with salt including garlic salt, celery salt, onion salt, and seasoned salt; sea salt, rock salt, kosher salt; meat tenderizers; monosodium glutamate; mustard, regular soy sauce, barbecue, sauce, chili sauce, teriyaki sauce, steak sauce, Worcestershire sauce, and most flavored vinegars; canned gravy and mixes; regular condiments; salted snack foods, olives, picles, relish, horseradish sauce, catsup   Food preparation: Try these seasonings Meats:    Pork Sage, onion Serve with applesauce  Chicken Poultry seasoning, thyme, parsley Serve with cranberry sauce  Lamb Curry powder, rosemary, garlic, thyme Serve with mint sauce or jelly  Veal Marjoram, basil Serve with current jelly, cranberry sauce  Beef Pepper, bay leaf Serve with dry mustard, unsalted chive butter  Fish [place] leaf, dill Serve with unsalted lemon butter, unsalted parsley butter  Vegetables:    Asparagus Lemon juice   Broccoli Lemon juice   Carrots Mustard dressing parsley, mint, nutmeg, glazed with unsalted butter and sugar   Green beans Marjoram, lemon juice, nutmeg,dill seed   Tomatoes Basil, marjoram, onion   Spice /blend for [employer]" 4 tsp ground thyme 1 tsp ground sage 3 tsp ground rosemary 4 tsp ground marjoram   Test your knowledge 1. A product that says "Salt Free" may still contain sodium. True or False 2. Garlic Powder and Hot Pepper Sauce an be used as alternative seasonings.True or False 3. Processed foods have more sodium than fresh foods.  True or False 4. Canned Vegetables have less sodium than froze True or False  WAYS TO DECREASE YOUR SODIUM INTAKE 1. Avoid the use of added salt in cooking and at the table.  Table salt (and other prepared seasonings which contain salt) is probably one of the greatest sources of sodium in the diet.  Unsalted foods can gain flavor from the sweet, sour, and butter taste sensations of herbs and spices.  Instead of using salt for  seasoning, try the following seasonings with the foods listed.  Remember: how you use them to enhance natural food flavors is limited only by your creativity... Allspice-Meat, fish, eggs, fruit, peas, red and yellow vegetables Almond Extract-Fruit baked goods Anise Seed-Sweet breads, fruit, carrots, beets, cottage cheese, cookies (tastes like licorice) Basil-Meat, fish, eggs, vegetables, rice, vegetables salads, soups, sauces Bay Leaf-Meat, fish, stews, poultry Burnet-Salad, vegetables (cucumber-like flavor) Caraway Seed-Bread, cookies, cottage cheese, meat, vegetables, cheese, rice Cardamon-Baked goods, fruit, soups Celery Powder or seed-Salads, salad dressings, sauces, meatloaf, soup, bread.Do not use  celery salt Chervil-Meats, salads, fish, eggs, vegetables, cottage cheese (parsley-like flavor) Chili Power-Meatloaf, chicken cheese, corn, eggplant, egg dishes Chives-Salads cottage cheese, egg dishes, soups, vegetables, sauces Cilantro-Salsa, casseroles Cinnamon-Baked goods, fruit, pork, lamb, chicken, carrots Cloves-Fruit, baked goods, fish, pot roast, green beans, beets, carrots Coriander-Pastry, cookies, meat, salads, cheese (lemon-orange flavor) Cumin-Meatloaf, fish,cheese, eggs, cabbage,fruit pie (caraway flavor) [employer], fruit, eggs, fish, poultry, cottage cheese, vegetables Dill Seed-Meat, cottage cheese, poultry, vegetables, fish, salads, bread Fennel Seed-Bread, cookies, apples, pork, eggs, fish, beets, cabbage, cheese, Licorice-like flavor Garlic-(buds or powder) Salads, meat, poultry, fish, bread, butter, vegetables, potatoes.Do not  use garlic salt Ginger-Fruit, vegetables, baked goods, meat, fish, poultry Horseradish Root-Meet, vegetables, butter Lemon Juice or Extract-Vegetables, fruit, tea, baked goods, fish salads Mace-Baked goods fruit, vegetables, fish, poultry (taste like nutmeg) Maple Extract-Syrups Marjoram-Meat, chicken, fish, vegetables, breads, green  salads (taste like Sage) Mint-Tea, lamb, sherbet, vegetables, desserts, carrots, cabbage Mustard, Dry or Seed-Cheese, eggs, meats, vegetables, poultry Nutmeg-Baked goods, fruit, chicken, eggs, vegetables, desserts Onion Powder-Meat, fish, poultry, vegetables, cheese, eggs, bread, rice salads (Do not use   Onion salt) Orange Extract-Desserts, baked goods Oregano-Pasta, eggs, cheese, onions, pork, lamb, fish, chicken, vegetables, green salads Paprika-Meat, fish, poultry, eggs, cheese, vegetables Parsley Flakes-Butter, vegetables, meat fish, poultry, eggs, bread, salads (certain forms may   Contain sodium Pepper-Meat fish, poultry, vegetables, eggs Peppermint Extract-Desserts, baked goods Poppy Seed-Eggs, bread, cheese, fruit dressings, baked goods, noodles, vegetables, cottage  [employer], poultry, meat, fish, cauliflower, turnips,eggs bread Saffron-Rice, bread, veal, chicken, fish, eggs Sage-Meat, fish, poultry, onions, eggplant, tomateos, pork, stews Savory-Eggs, salads, poultry, meat, rice, vegetables, soups, pork Tarragon-Meat, poultry, fish, eggs, butter, vegetables (licorice-like flavor)  Thyme-Meat, poultry, fish, eggs, vegetables, (clover-like flavor), sauces, soups Tumeric-Salads, butter, eggs, fish, rice, vegetables (saffron-like flavor) Vanilla Extract-Baked goods, candy Vinegar-Salads, vegetables, meat marinades Walnut Extract-baked goods, candy  2. Choose your Foods Wisely   The following is a list of foods to avoid which are high in sodium:  Meats-Avoid all smoked, canned, salt cured, dried and kosher meat and fish as well as Anchovies   [employer] meats:Bologna, Liverwurst, Pastrami Canned meat or fish  Marinated herring Caviar    Pepperoni Corned Beef   Pizza Dried chipped beef  Salami Frozen breaded fish or meat Salt pork Frankfurters or hot dogs  Sardines Gefilte fish   Sausage Ham (boiled ham,  Proscuitto Smoked butt    spiced ham)   Spam      TV Dinners Vegetables Canned vegetables (Regular) Relish Canned mushrooms  Sauerkraut Olives    Tomato juice Pickles  Bakery and Dessert Products Canned puddings  Cream pies Cheesecake   Decorated cakes Cookies  Beverages/Juices Tomato juice, regular  Gatorade   V-8 vegetable juice, regular  Breads and Cereals Biscuit mixes   Salted potato chips, corn chips, pretzels Bread stuffing mixes  Salted crackers and rolls Pancake and waffle mixes Self-rising flour  Seasonings Accent    Meat sauces Barbecue sauce  Meat tenderizer Catsup    Monosodium glutamate (MSG) Celery salt   Onion salt Chili sauce   Prepared mustard Garlic salt   Salt, seasoned salt, sea salt Gravy mixes   Soy sauce Horseradish   Steak sauce Ketchup   Tartar sauce Lite salt    Teriyaki sauce Marinade mixes   Worcestershire sauce  Others Baking powder   Cocoa and cocoa mixes Baking soda   Commercial casserole mixes Candy-caramels, chocolate  Dehydrated soups    Bars, fudge,nougats  Instant rice and pasta mixes Canned broth or soup  Maraschino cherries Cheese, aged and processed cheese and cheese spreads  Learning Assessment Quiz  Indicated T (for True) or F (for False) for each of the following statements:  1. _____ Fresh fruits and vegetables and unprocessed grains are generally low in sodium 2. _____ Water may contain a considerable amount of sodium, depending on the source 3. _____ You can always tell if a food is high in sodium by tasting it 4. _____ Certain laxatives my be high in sodium and should be avoided unless prescribed   by a physician or pharmacist 5. _____ Salt substitutes may be used freely by anyone on a sodium restricted diet 6. _____ Sodium is present in table salt, food additives and as a natural component of   most foods 7. _____ Table salt is  approximately 90% sodium 8. _____ Limiting sodium intake may help prevent excess fluid  accumulation in the body 9. _____ On a sodium-restricted diet, seasonings such as bouillon soy sauce, and    cooking wine should be used in place of table salt 10. _____ On an ingredient list, a product which lists monosodium glutamate as the first   ingredient is an appropriate food to include on a low sodium diet  Circle the best answer(s) to the following statements (Hint: there may be more than one correct answer)  11. On a low-sodium diet, some acceptable snack items are:    A. Olives  F. Bean dip   K. Grapefruit juice    B. Salted Pretzels G. Commercial Popcorn   L. Canned peaches    C. Carrot Sticks  H. Bouillon   M. Unsalted nuts   D. [place] fries  I. Peanut butter crackers N. Salami   E. Sweet pickles J. Tomato Juice   O. Pizza  12.  Seasonings that may be used freely on a reduced - sodium diet include   A. Lemon wedges F.Monosodium glutamate K. Celery seed    B.Soysauce   G. Pepper   L. Mustard powder   C. Sea salt  H. Cooking wine  M. Onion flakes   D. Vinegar  E. Prepared horseradish N. Salsa   E. Sage   J. Worcestershire sauce  O. Chutney

## 2020-03-21 NOTE — Telephone Encounter (Signed)
CPAP TURNED IN: Reached out to the patient to ask about being non-compliant with his cpap. Patient states he was doing very good on his cpap but the headgear was not comfortable for him to wear. It irritated him really bad. The velcro on the strap would not hold and the part above his nose would fall down in his mouth. He called his dme and was told he could not get a new headgear to fit his machine he would need another machine with altogether. The mask he got from the sleep lab worked well for the sleep study but would not work for his cpap so he stopped wearing the unit and his dme told him he should turn the machine in for non-compliance and he did.

## 2020-03-22 ENCOUNTER — Telehealth: Payer: Self-pay | Admitting: *Deleted

## 2020-03-22 DIAGNOSIS — G4733 Obstructive sleep apnea (adult) (pediatric): Secondary | ICD-10-CM

## 2020-03-22 NOTE — Telephone Encounter (Signed)
-----   Message from Quintella Reichert, MD sent at 03/19/2020  7:11 PM EDT ----- Regarding: RE: CPAP Nina  Please set him up with DME to get a mask fitting.  Also find out if they took his CPAP machine back.  If they took it back please find out if we need to start over with split night sleep study and if yes then order the sleep study  [name] ----- Message ----- From: Beatrice Lecher, PA-C Sent: 03/19/2020   4:45 PM EDT To: Quintella Reichert, MD, Reesa Chew, CMA Subject: CPAP                                           [name] He could not wear the face mask he had and the [facility] agency took it back due to noncompliance.   Can you follow up with him to see if there is anything else he can wear? I will copy [facility] as well. Thanks [name]

## 2020-03-22 NOTE — Telephone Encounter (Signed)
Order placed for a split night study.

## 2020-03-25 ENCOUNTER — Telehealth: Payer: Self-pay | Admitting: *Deleted

## 2020-03-25 ENCOUNTER — Encounter: Payer: Self-pay | Admitting: Family Medicine

## 2020-03-25 ENCOUNTER — Other Ambulatory Visit: Payer: Self-pay

## 2020-03-25 ENCOUNTER — Ambulatory Visit (INDEPENDENT_AMBULATORY_CARE_PROVIDER_SITE_OTHER): Payer: BC Managed Care – PPO | Admitting: Family Medicine

## 2020-03-25 VITALS — BP 136/90 | HR 73 | Temp 96.5°F | Wt 276.0 lb

## 2020-03-25 DIAGNOSIS — I10 Essential (primary) hypertension: Secondary | ICD-10-CM | POA: Diagnosis not present

## 2020-03-25 DIAGNOSIS — Z8679 Personal history of other diseases of the circulatory system: Secondary | ICD-10-CM | POA: Diagnosis not present

## 2020-03-25 DIAGNOSIS — E785 Hyperlipidemia, unspecified: Secondary | ICD-10-CM

## 2020-03-25 DIAGNOSIS — Z9989 Dependence on other enabling machines and devices: Secondary | ICD-10-CM

## 2020-03-25 DIAGNOSIS — Z9049 Acquired absence of other specified parts of digestive tract: Secondary | ICD-10-CM

## 2020-03-25 DIAGNOSIS — G4733 Obstructive sleep apnea (adult) (pediatric): Secondary | ICD-10-CM

## 2020-03-25 LAB — LIPID PANEL
Chol/HDL Ratio: 3.2 ratio (ref 0.0–5.0)
Cholesterol, Total: 136 mg/dL (ref 100–199)
HDL: 43 mg/dL (ref >39)
LDL Chol Calc (NIH): 75 mg/dL (ref 0–99)
Triglycerides: 94 mg/dL (ref 0–149)
VLDL Cholesterol Cal: 18 mg/dL (ref 5–40)

## 2020-03-25 NOTE — Patient Instructions (Signed)
Stay on your present blood pressure medications.  Make sure you get a mask for your CPAP machine that works well.. Want to do them do that for a month and have them help you get a readout on the machine and send me the results .  Then we can readjust your CPAP as needed.  Lets look at your blood pressure and your CPAP readout at the same time probably in 6 weeks

## 2020-03-25 NOTE — Telephone Encounter (Signed)
Per [employer] web portal no PA is required for sleep studies. Staff message sent to [facility] ok to schedule.

## 2020-03-25 NOTE — Progress Notes (Signed)
   Subjective:    [name], male    DOB: [birth year], 59 y.o.   MRN: [id number]  HPI He is here for follow-up visit to have his lipids checked.  It has been over a year since they were last checked.  He does take Pravachol.  He was recently seen by cardiology and had his blood pressure medications readjusted due to his hypertension.  He also has OSA and is planned to get his CPAP mask readjusted.  He has concerns over the number of visits to cardiology recently.  He did have an episode of A. fib while in the hospital.  Presently he is having no symptoms from that.  [facility]   Review of Systems     Objective:   Physical Exam Alert and in no distress otherwise not examined       Assessment & Plan:  Hyperlipidemia with target LDL less than 100  Essential hypertension  OSA on CPAP  History of atrial fibrillation - Occurred postoperatively from lap chole  S/P laparoscopic cholecystectomy I discussed the meds he is on for his blood pressure and explained that this is to try and get his blood pressure under better control.  I then discussed the fact that his OSA can be contributing to his elevated blood pressure.  Recommend he get a mask to fit appropriately, then get a CPAP readout and at that time get his blood pressure and CPAP readout discussed at the same time.  Explained that I can do that here or he can go to cardiology.

## 2020-03-27 NOTE — Telephone Encounter (Signed)
Patient is scheduled for lab study on 05/02/20. Patient understands his sleep study will be done at [facility] sleep lab. Patient understands he will receive a sleep packet in a week or so. Patient understands to call if he does not receive the sleep packet in a timely manner.  Left detailed message on voicemail with date and time of  and informed patient to call back to confirm or reschedule.

## 2020-03-28 ENCOUNTER — Other Ambulatory Visit: Payer: Self-pay

## 2020-03-28 ENCOUNTER — Ambulatory Visit (INDEPENDENT_AMBULATORY_CARE_PROVIDER_SITE_OTHER): Payer: BC Managed Care – PPO | Admitting: Pharmacist

## 2020-03-28 VITALS — BP 150/98 | HR 66

## 2020-03-28 DIAGNOSIS — I10 Essential (primary) hypertension: Secondary | ICD-10-CM | POA: Diagnosis not present

## 2020-03-28 NOTE — Patient Instructions (Signed)
It was good seeing again!  We would like your blood pressure to be less than 130/80  I hope everything works out with your sleep study and your get a CPAP machine that fits  I will call you tomorrow to check on your blood pressure  Continue your carvedilol 25 mg twice a day Continue your chlorthalidone 25 mg a day Continue your diltiazem 360 mg daily Continue your losartan 100 mg daily  Laural Golden, PharmD, BCACP, CDCES [facility] [address] [place], [place] [address] Phone: [phone]; Fax: [phone] 03/28/2020 4:25 PM

## 2020-03-28 NOTE — Progress Notes (Signed)
Patient IDShaden [name]                 DOB: 1961-03-04                      MRN: [id number]     HPI: Martin [name] is a 59 y.o. male referred by Dr. Mayford Knife to HTN clinic. PMH is significant for HTN, A Fib in the setting of acute illness, and OSA.  Patient recently had shoulder surgery at the [facility] in [place] and has been going to physical therapy Tuesdays and Thursdays.    Seen by Tereso Newcomer on 9/28 and diltiazem was increased to 360mg  daily.  Patient reports it has been helping and blood pressure has been decreasing.  [facility], wife says blood pressure was 123/63.  Patient had CPAP mask taken away due to noncompliance.  He reports he was unable to sleep with it on and needs a different style.  Has follow up sleep study scheduled in November.     Current HTN meds: carvedilol 25mg  BID, chlorthalidone 25mg  daily, diltiazem 360 daily, losartan 100mg  daily  Previously tried: amlodipine 10mg , diltiazem 120,180,240,300mg , HCTZ 25mg , losartan/hctz 100/25, metoprolol 100mg ,  olmesartan/amlodipine/hctz   BP goal: <130/80  Diet: Eats out frequently at restaurants, does not watch portion sizes  Wt Readings from Last 3 Encounters:  03/25/20 276 lb (125.2 kg)  03/19/20 274 lb (124.3 kg)  02/27/20 272 lb 6.4 oz (123.6 kg)   BP Readings from Last 3 Encounters:  03/25/20 136/90  03/19/20 (!) 144/90  02/27/20 (!) 160/110   Pulse Readings from Last 3 Encounters:  03/25/20 73  03/19/20 75  02/27/20 70    Renal function: Estimated Creatinine Clearance: 79.1 mL/min (A) (by C-G formula based on SCr of 1.36 mg/dL (H)).  Past Medical History:  Diagnosis Date  . Cardiomegaly    LVH  . Cataract   . Hemorrhoids   . Hyperlipidemia   . Hypertension   . Hypothyroidism   . Lower GI bleeding   . OSA (obstructive sleep apnea)    mild OSA with an AHI of 8.1/hr but severe during REM sleep with AHI 43/hr.  He had O2 desats as low as 85%.  04/28/20 PAF (paroxysmal atrial fibrillation)  (HCC)    in setting of acute cholecystitis.  No anticoagulation due to setting of acute illness and CHADS2VAS score of 1    Current Outpatient Medications on File Prior to Visit  Medication Sig Dispense Refill  . aspirin 81 MG tablet Take 81 mg by mouth daily.      . carvedilol (COREG) 25 MG tablet Take 1 tablet (25 mg total) by mouth 2 (two) times daily. 180 tablet 3  . chlorthalidone (HYGROTON) 25 MG tablet Take 1 tablet (25 mg total) by mouth daily. 30 tablet 6  . diltiazem (CARDIZEM CD) 360 MG 24 hr capsule Take 1 capsule (360 mg total) by mouth daily. 90 capsule 1  . HYDROcodone-acetaminophen (NORCO/VICODIN) 5-325 MG tablet Take 1 tablet by mouth 2 (two) times daily as needed. (Patient not taking: Reported on 03/25/2020)    . losartan (COZAAR) 100 MG tablet Take 1 tablet (100 mg total) by mouth daily. 30 tablet 6  . pravastatin (PRAVACHOL) 40 MG tablet Take 1 tablet by mouth once daily 90 tablet 0   No current facility-administered medications on file prior to visit.    Allergies  Allergen Reactions  . Garlic Swelling  . Shrimp [Shellfish Allergy] Swelling  Assessment/Plan:  1. Hypertension - Patient BP in room today 150/98 which is above goal of <130/80.  Patient relates this to just having come from physical therapy and eating a large lunch.  Reports his BP this morning was 135/80.  Likely the majority of his HTN issues stem from OSA.  Encouraged patient to make it clear what type of mask he needs for CPAP to fit his sleeping style. Reiterated again the link between sleep apnea and hypertension.    Patient is hesitant to start more medication therapy.  However, since he is near BP goal at home, possible he will reach goal once his OSA is controlled.  Will contact patient tomorrow to see what his home BP reading is since he has been busy and stressed today from appointments.  Laural Golden, PharmD, BCACP, CDCES [facility] [address]  [place], [place] [address] Phone: [phone]; Fax: [phone] 03/29/2020 12:04 PM

## 2020-04-03 ENCOUNTER — Telehealth: Payer: Self-pay | Admitting: Pharmacist

## 2020-04-03 NOTE — Telephone Encounter (Signed)
Left message on machine checking on BP readings

## 2020-04-09 ENCOUNTER — Other Ambulatory Visit: Payer: Self-pay | Admitting: Family Medicine

## 2020-04-09 DIAGNOSIS — E785 Hyperlipidemia, unspecified: Secondary | ICD-10-CM

## 2020-04-18 NOTE — Progress Notes (Signed)
Cardiology Office Note:    Date:  04/19/2020   ID:  [name], DOB 08-Aug-1960, MRN [id number]  PCP:  [name], MD  [facility] Cardiologist:  Fransico Him, MD   [facility] Electrophysiologist:  None   Referring MD: [name], MD   Chief Complaint:  Follow-up (HTN)    Patient Profile:    Martin [name] is a 59 y.o. male with:   Hypertension   Hyperlipidemia   Hypothyroidism   Paroxysmal atrial fibrillation  ? In setting of acute cholecystitis; no anticoagulation - caused by acute illness ? CHA2DS2-VASc=1 (HTN)    OSA, on CPAP  Hx of LGI bleeding   Prior CV studies: Echocardiogram 05/31/2019 EF 55-60, mild LVH, no RWMA, normal RVSF, mild MAC, trivial MR, trivial TR  History of Present Illness:    Martin [name] was last seen for hypertension in 9/21.  I adjusted his Diltiazem.  Since then, he has had follow up with the HTN clinic.  No further changes were made as his home BP readings were close to optimal.  Of note, he could not tolerate his CPAP and his machine was reclaimed by the DME agency.  I did reach out to [name] and the sleep med clinic will be arranging a repeat study to see if alternative equipment can be used to improve adherence.  He returns for follow up.  He is here alone.  He is doing well without chest pain, shortness of breath, syncope, leg swelling.  He has been somewhat lightheaded.  His BPs at home have been normal.    Past Medical History:  Diagnosis Date  . Cardiomegaly    LVH  . Cataract   . Hemorrhoids   . Hyperlipidemia   . Hypertension   . Hypothyroidism   . Lower GI bleeding   . OSA (obstructive sleep apnea)    mild OSA with an AHI of 8.1/hr but severe during REM sleep with AHI 43/hr.  He had O2 desats as low as 85%.  [name] PAF (paroxysmal atrial fibrillation) ([place])    in setting of acute cholecystitis.  No anticoagulation due to setting of acute illness and CHADS2VAS score of 1    Current  Medications: Current Meds  Medication Sig  . aspirin 81 MG tablet Take 81 mg by mouth daily.    . carvedilol (COREG) 25 MG tablet Take 1 tablet (25 mg total) by mouth 2 (two) times daily.  . chlorthalidone (HYGROTON) 25 MG tablet Take 1 tablet (25 mg total) by mouth daily.  [name] diltiazem (CARDIZEM CD) 360 MG 24 hr capsule Take 1 capsule (360 mg total) by mouth daily.  [name] HYDROcodone-acetaminophen (NORCO/VICODIN) 5-325 MG tablet Take 1 tablet by mouth every 6 (six) hours as needed for moderate pain (From [facility] hospital).  [name] losartan (COZAAR) 100 MG tablet Take 1 tablet (100 mg total) by mouth daily.  . pravastatin (PRAVACHOL) 40 MG tablet Take 1 tablet by mouth once daily     Allergies:   Garlic and Shrimp [shellfish allergy]   Social History   Tobacco Use  . Smoking status: Never Smoker  . Smokeless tobacco: Never Used  Substance Use Topics  . Alcohol use: Yes    Comment: Rare  . Drug use: No     Family Hx: The patient's family history includes Asthma in his father; Hypertension in his father; Kidney disease in his father and mother.  ROS   EKGs/Labs/Other Test Reviewed:    EKG:  EKG is not  ordered today.  The ekg ordered today demonstrates n/a  Recent Labs: 05/30/2019: B Natriuretic Peptide 75.9 05/31/2019: TSH 2.498 06/01/2019: Magnesium 2.8 06/14/2019: ALT 34; Hemoglobin 11.6; Platelets 411 03/19/2020: BUN 20; Creatinine, Ser 1.36; Potassium 3.7; Sodium 141   Recent Lipid Panel Lab Results  Component Value Date/Time   CHOL 136 03/25/2020 09:03 AM   TRIG 94 03/25/2020 09:03 AM   HDL 43 03/25/2020 09:03 AM   CHOLHDL 3.2 03/25/2020 09:03 AM   CHOLHDL 3.0 04/03/2016 07:54 AM   LDLCALC 75 03/25/2020 09:03 AM      Risk Assessment/Calculations:     CHA2DS2-VASc Score = 1  This indicates a 0.6% annual risk of stroke. The patient's score is based upon: CHF History: 0 HTN History: 1 Diabetes History: 0 Stroke History: 0 Vascular Disease History: 0 Age Score: 0 Gender  Score: 0      Physical Exam:    VS:  BP 103/62   Pulse 75   Ht _0  (1.854 m)   Wt 275 lb (124.7 kg)   SpO2 96%   BMI 36.28 kg/m     Wt Readings from Last 3 Encounters:  04/19/20 275 lb (124.7 kg)  03/25/20 276 lb (125.2 kg)  03/19/20 274 lb (124.3 kg)     Constitutional:      Appearance: Healthy appearance. Not in distress.  Pulmonary:     Effort: Pulmonary effort is normal.     Breath sounds: No wheezing. No rales.  Cardiovascular:     Normal rate. Regular rhythm. Normal S1. Normal S2.     Murmurs: There is no murmur.  Edema:    Peripheral edema absent.  Abdominal:     Palpations: Abdomen is soft.  Skin:    General: Skin is warm and dry.  Neurological:     General: No focal deficit present.     Mental Status: Alert and oriented to person, place and time.     Cranial Nerves: Cranial nerves are intact.       ASSESSMENT & PLAN:    1. Essential hypertension BP is optimal.  Repeat by me is 110/80.  I think it is going to take some time to get used to a normal BP for him.  We had a long discussion about this.  He is comfortable with continuing the same medications.  Therefore, continue to take the current dose of Carvedilol, Chlorthalidone, Diltiazem, Losartan.  If his BP runs lower and he remains symptomatic, we could reduce his Losartan to 50 mg once daily.  FU with [name] in 6 mos.   2. OSA (obstructive sleep apnea) He is getting set up with a new CPAP machine. FU with [name] in 6 mos.     Dispo:  Return in about 6 months (around 10/18/2020) for Routine Follow Up w/ [name].   Medication Adjustments/Labs and Tests Ordered: Current medicines are reviewed at length with the patient today.  Concerns regarding medicines are outlined above.  Tests Ordered: No orders of the defined types were placed in this encounter.  Medication Changes: No orders of the defined types were placed in this encounter.   Signed, Richardson Dopp, PA-C  04/19/2020 12:34 PM     [facility] [address], [place], [place]  [address] Phone: [phone]; Fax: [phone]

## 2020-04-19 ENCOUNTER — Ambulatory Visit (INDEPENDENT_AMBULATORY_CARE_PROVIDER_SITE_OTHER): Payer: BC Managed Care – PPO | Admitting: Physician Assistant

## 2020-04-19 ENCOUNTER — Encounter: Payer: Self-pay | Admitting: Physician Assistant

## 2020-04-19 ENCOUNTER — Other Ambulatory Visit: Payer: Self-pay

## 2020-04-19 VITALS — BP 103/62 | HR 75 | Ht 73.0 in | Wt 275.0 lb

## 2020-04-19 DIAGNOSIS — I10 Essential (primary) hypertension: Secondary | ICD-10-CM | POA: Diagnosis not present

## 2020-04-19 DIAGNOSIS — G4733 Obstructive sleep apnea (adult) (pediatric): Secondary | ICD-10-CM | POA: Diagnosis not present

## 2020-04-19 NOTE — Patient Instructions (Signed)
Medication Instructions:  Your physician recommends that you continue on your current medications as directed. Please refer to the Current Medication list given to you today.  *If you need a refill on your cardiac medications before your next appointment, please call your pharmacy*  Lab Work: None ordered today  If you have labs (blood work) drawn today and your tests are completely normal, you will receive your results only by: [name] MyChart Message (if you have MyChart) OR . A paper copy in the mail If you have any lab test that is abnormal or we need to change your treatment, we will call you to review the results.  Testing/Procedures: None ordered today  Follow-Up: At [facility], you and your health needs are our priority.  As part of our continuing mission to provide you with exceptional heart care, we have created designated Provider Care Teams.  These Care Teams include your primary Cardiologist (physician) and Advanced Practice Providers (APPs -  Physician Assistants and Nurse Practitioners) who all work together to provide you with the care you need, when you need it.  Your next appointment:   6 month(s)  The format for your next appointment:   In Person  Provider:   Armanda Magic, MD  Other Instructions: Monitor blood pressure and if blood pressure continues to go down and dizziness continues call the office

## 2020-05-02 ENCOUNTER — Encounter (HOSPITAL_BASED_OUTPATIENT_CLINIC_OR_DEPARTMENT_OTHER): Payer: BC Managed Care – PPO | Admitting: Cardiology

## 2020-05-02 NOTE — Telephone Encounter (Signed)
Saturday 06/01/20 rescheduled.

## 2020-05-26 ENCOUNTER — Encounter (HOSPITAL_BASED_OUTPATIENT_CLINIC_OR_DEPARTMENT_OTHER): Payer: Self-pay

## 2020-05-26 ENCOUNTER — Other Ambulatory Visit: Payer: Self-pay

## 2020-05-26 ENCOUNTER — Observation Stay (HOSPITAL_COMMUNITY)
Admission: EM | Admit: 2020-05-26 | Discharge: 2020-05-28 | Disposition: A | Discharge status: Home / Self Care | Payer: BC Managed Care – PPO | Attending: Internal Medicine | Admitting: Internal Medicine

## 2020-05-26 ENCOUNTER — Emergency Department (HOSPITAL_COMMUNITY): Payer: BC Managed Care – PPO

## 2020-05-26 ENCOUNTER — Encounter (HOSPITAL_COMMUNITY): Payer: Self-pay | Admitting: Emergency Medicine

## 2020-05-26 DIAGNOSIS — R0602 Shortness of breath: Secondary | ICD-10-CM | POA: Diagnosis present

## 2020-05-26 DIAGNOSIS — Z7982 Long term (current) use of aspirin: Secondary | ICD-10-CM | POA: Insufficient documentation

## 2020-05-26 DIAGNOSIS — Z86711 Personal history of pulmonary embolism: Secondary | ICD-10-CM | POA: Diagnosis present

## 2020-05-26 DIAGNOSIS — E876 Hypokalemia: Secondary | ICD-10-CM | POA: Diagnosis present

## 2020-05-26 DIAGNOSIS — U071 COVID-19: Principal | ICD-10-CM | POA: Diagnosis present

## 2020-05-26 DIAGNOSIS — I480 Paroxysmal atrial fibrillation: Secondary | ICD-10-CM | POA: Diagnosis present

## 2020-05-26 DIAGNOSIS — I10 Essential (primary) hypertension: Secondary | ICD-10-CM | POA: Diagnosis not present

## 2020-05-26 DIAGNOSIS — I2699 Other pulmonary embolism without acute cor pulmonale: Secondary | ICD-10-CM

## 2020-05-26 DIAGNOSIS — R0789 Other chest pain: Secondary | ICD-10-CM | POA: Diagnosis present

## 2020-05-26 DIAGNOSIS — Z79899 Other long term (current) drug therapy: Secondary | ICD-10-CM | POA: Diagnosis not present

## 2020-05-26 DIAGNOSIS — E039 Hypothyroidism, unspecified: Secondary | ICD-10-CM | POA: Diagnosis not present

## 2020-05-26 NOTE — ED Triage Notes (Signed)
Pt c/o increased shortness of breath with exertion x 1 day.Intermittent chest pain, denies pain at this time.

## 2020-05-27 ENCOUNTER — Observation Stay (HOSPITAL_BASED_OUTPATIENT_CLINIC_OR_DEPARTMENT_OTHER): Payer: BC Managed Care – PPO

## 2020-05-27 ENCOUNTER — Encounter (HOSPITAL_COMMUNITY): Payer: Self-pay | Admitting: Internal Medicine

## 2020-05-27 ENCOUNTER — Emergency Department (HOSPITAL_COMMUNITY): Payer: BC Managed Care – PPO

## 2020-05-27 DIAGNOSIS — U071 COVID-19: Secondary | ICD-10-CM

## 2020-05-27 DIAGNOSIS — R0602 Shortness of breath: Secondary | ICD-10-CM | POA: Diagnosis not present

## 2020-05-27 DIAGNOSIS — Z86711 Personal history of pulmonary embolism: Secondary | ICD-10-CM | POA: Diagnosis present

## 2020-05-27 DIAGNOSIS — R079 Chest pain, unspecified: Secondary | ICD-10-CM | POA: Diagnosis not present

## 2020-05-27 DIAGNOSIS — I2699 Other pulmonary embolism without acute cor pulmonale: Secondary | ICD-10-CM

## 2020-05-27 DIAGNOSIS — I480 Paroxysmal atrial fibrillation: Secondary | ICD-10-CM

## 2020-05-27 DIAGNOSIS — R0789 Other chest pain: Secondary | ICD-10-CM

## 2020-05-27 DIAGNOSIS — E876 Hypokalemia: Secondary | ICD-10-CM | POA: Diagnosis present

## 2020-05-27 DIAGNOSIS — I10 Essential (primary) hypertension: Secondary | ICD-10-CM | POA: Diagnosis not present

## 2020-05-27 LAB — BASIC METABOLIC PANEL
Anion gap: 13 (ref 5–15)
Anion gap: 11 (ref 5–15)
BUN: 21 mg/dL (ref 8–23)
BUN: 21 mg/dL (ref 8–23)
CO2: 25 mmol/L (ref 22–32)
CO2: 27 mmol/L (ref 22–32)
Calcium: 9 mg/dL (ref 8.9–10.3)
Calcium: 8.6 mg/dL — ABNORMAL LOW (ref 8.9–10.3)
Chloride: 104 mmol/L (ref 98–111)
Chloride: 103 mmol/L (ref 98–111)
Creatinine, Ser: 1.52 mg/dL — ABNORMAL HIGH (ref 0.61–1.24)
Creatinine, Ser: 1.53 mg/dL — ABNORMAL HIGH (ref 0.61–1.24)
GFR, Estimated: 52 mL/min — ABNORMAL LOW (ref >60)
GFR, Estimated: 51 mL/min — ABNORMAL LOW (ref >60)
Glucose, Bld: 144 mg/dL — ABNORMAL HIGH (ref 70–99)
Glucose, Bld: 155 mg/dL — ABNORMAL HIGH (ref 70–99)
Potassium: 3.4 mmol/L — ABNORMAL LOW (ref 3.5–5.1)
Potassium: 3.7 mmol/L (ref 3.5–5.1)
Sodium: 142 mmol/L (ref 135–145)
Sodium: 141 mmol/L (ref 135–145)

## 2020-05-27 LAB — CBC
HCT: 38.4 % — ABNORMAL LOW (ref 39.0–52.0)
HCT: 40.7 % (ref 39.0–52.0)
Hemoglobin: 12.1 g/dL — ABNORMAL LOW (ref 13.0–17.0)
Hemoglobin: 13 g/dL (ref 13.0–17.0)
MCH: 30.7 pg (ref 26.0–34.0)
MCH: 31.1 pg (ref 26.0–34.0)
MCHC: 31.5 g/dL (ref 30.0–36.0)
MCHC: 31.9 g/dL (ref 30.0–36.0)
MCV: 97.4 fL (ref 80.0–100.0)
MCV: 97.5 fL (ref 80.0–100.0)
Platelets: 105 10*3/uL — ABNORMAL LOW (ref 150–400)
Platelets: 128 10*3/uL — ABNORMAL LOW (ref 150–400)
RBC: 3.94 MIL/uL — ABNORMAL LOW (ref 4.22–5.81)
RBC: 4.18 MIL/uL — ABNORMAL LOW (ref 4.22–5.81)
RDW: 13.8 % (ref 11.5–15.5)
RDW: 13.8 % (ref 11.5–15.5)
WBC: 4.4 10*3/uL (ref 4.0–10.5)
WBC: 4.8 10*3/uL (ref 4.0–10.5)
nRBC: 0 % (ref 0.0–0.2)
nRBC: 0 % (ref 0.0–0.2)

## 2020-05-27 LAB — MAGNESIUM: Magnesium: 1.7 mg/dL (ref 1.7–2.4)

## 2020-05-27 LAB — RESP PANEL BY RT-PCR (FLU A&B, COVID) ARPGX2
Influenza A by PCR: NEGATIVE
Influenza B by PCR: NEGATIVE
SARS Coronavirus 2 by RT PCR: POSITIVE — AB

## 2020-05-27 LAB — ECHOCARDIOGRAM COMPLETE
Area-P 1/2: 1.93 cm2
Height: 72 in
S' Lateral: 3.4 cm
Weight: 4240 oz

## 2020-05-27 LAB — LACTATE DEHYDROGENASE: LDH: 237 U/L — ABNORMAL HIGH (ref 98–192)

## 2020-05-27 LAB — HEPARIN LEVEL (UNFRACTIONATED)
Heparin Unfractionated: 0.65 IU/mL (ref 0.30–0.70)
Heparin Unfractionated: 0.33 IU/mL (ref 0.30–0.70)
Heparin Unfractionated: 0.54 IU/mL (ref 0.30–0.70)

## 2020-05-27 LAB — PROCALCITONIN: Procalcitonin: <0.1 ng/mL

## 2020-05-27 LAB — D-DIMER, QUANTITATIVE: D-Dimer, Quant: 1.55 ug/mL-FEU — ABNORMAL HIGH (ref 0.00–0.50)

## 2020-05-27 LAB — TROPONIN I (HIGH SENSITIVITY)
Troponin I (High Sensitivity): 28 ng/L — ABNORMAL HIGH (ref <18)
Troponin I (High Sensitivity): 24 ng/L — ABNORMAL HIGH (ref <18)
Troponin I (High Sensitivity): 25 ng/L — ABNORMAL HIGH (ref <18)

## 2020-05-27 LAB — BRAIN NATRIURETIC PEPTIDE: B Natriuretic Peptide: 13.4 pg/mL (ref 0.0–100.0)

## 2020-05-27 LAB — PHOSPHORUS: Phosphorus: 3 mg/dL (ref 2.5–4.6)

## 2020-05-27 MED ORDER — ALBUTEROL SULFATE HFA 108 (90 BASE) MCG/ACT IN AERS: 1.0000 | INHALATION_SPRAY | RESPIRATORY_TRACT | Status: DC | PRN

## 2020-05-27 MED ORDER — ALBUTEROL SULFATE HFA 108 (90 BASE) MCG/ACT IN AERS: 2.0000 | INHALATION_SPRAY | Freq: Once | RESPIRATORY_TRACT | Status: DC | PRN

## 2020-05-27 MED ORDER — DIPHENHYDRAMINE HCL 50 MG/ML IJ SOLN: 50.0000 mg | Freq: Once | INTRAMUSCULAR | Status: DC | PRN

## 2020-05-27 MED ORDER — FAMOTIDINE IN NACL 20-0.9 MG/50ML-% IV SOLN
20.0000 mg | Freq: Once | INTRAVENOUS | Status: DC | PRN
  Filled 2020-05-27: qty 50

## 2020-05-27 MED ORDER — METHYLPREDNISOLONE SODIUM SUCC 125 MG IJ SOLR: 125.0000 mg | Freq: Once | INTRAMUSCULAR | Status: DC | PRN

## 2020-05-27 MED ORDER — SODIUM CHLORIDE 0.9 % IV SOLN: INTRAVENOUS | Status: DC

## 2020-05-27 MED ORDER — HYDROCODONE-ACETAMINOPHEN 5-325 MG PO TABS: 1.0000 | ORAL_TABLET | Freq: Four times a day (QID) | ORAL | Status: DC | PRN

## 2020-05-27 MED ORDER — HEPARIN (PORCINE) 25000 UT/250ML-% IV SOLN
1750.0000 [IU]/h | INTRAVENOUS | Status: DC
  Administered 2020-05-27: 1700 [IU]/h via INTRAVENOUS
  Filled 2020-05-27 (×2): qty 250

## 2020-05-27 MED ORDER — SODIUM CHLORIDE 0.9 % IV SOLN
Freq: Once | INTRAVENOUS | Status: AC
  Filled 2020-05-27: qty 20

## 2020-05-27 MED ORDER — LOSARTAN POTASSIUM 50 MG PO TABS
100.0000 mg | ORAL_TABLET | Freq: Every day | ORAL | Status: DC
  Administered 2020-05-27 – 2020-05-28 (×2): 100 mg via ORAL
  Filled 2020-05-27 (×2): qty 2

## 2020-05-27 MED ORDER — EPINEPHRINE 0.3 MG/0.3ML IJ SOAJ: 0.3000 mg | Freq: Once | INTRAMUSCULAR | Status: DC | PRN

## 2020-05-27 MED ORDER — POTASSIUM CHLORIDE CRYS ER 20 MEQ PO TBCR
40.0000 meq | EXTENDED_RELEASE_TABLET | Freq: Once | ORAL | Status: AC
  Administered 2020-05-27: 40 meq via ORAL
  Filled 2020-05-27: qty 2

## 2020-05-27 MED ORDER — CARVEDILOL 12.5 MG PO TABS
25.0000 mg | ORAL_TABLET | Freq: Two times a day (BID) | ORAL | Status: DC
  Administered 2020-05-27 – 2020-05-28 (×3): 25 mg via ORAL
  Filled 2020-05-27 (×3): qty 2

## 2020-05-27 MED ORDER — IOHEXOL 350 MG/ML SOLN
75.0000 mL | Freq: Once | INTRAVENOUS | Status: AC | PRN
  Administered 2020-05-27: 75 mL via INTRAVENOUS

## 2020-05-27 MED ORDER — HEPARIN BOLUS VIA INFUSION
5000.0000 [IU] | Freq: Once | INTRAVENOUS | Status: AC
  Administered 2020-05-27: 5000 [IU] via INTRAVENOUS
  Filled 2020-05-27: qty 5000

## 2020-05-27 MED ORDER — ACETAMINOPHEN 325 MG PO TABS: 650.0000 mg | ORAL_TABLET | Freq: Four times a day (QID) | ORAL | Status: DC | PRN

## 2020-05-27 MED ORDER — ACETAMINOPHEN 650 MG RE SUPP: 650.0000 mg | Freq: Four times a day (QID) | RECTAL | Status: DC | PRN

## 2020-05-27 MED ORDER — SODIUM CHLORIDE 0.9 % IV SOLN: INTRAVENOUS | Status: DC | PRN

## 2020-05-27 MED ORDER — PRAVASTATIN SODIUM 40 MG PO TABS
40.0000 mg | ORAL_TABLET | Freq: Every day | ORAL | Status: DC
  Administered 2020-05-27 – 2020-05-28 (×2): 40 mg via ORAL
  Filled 2020-05-27 (×2): qty 1

## 2020-05-27 MED ORDER — DILTIAZEM HCL ER COATED BEADS 180 MG PO CP24
180.0000 mg | ORAL_CAPSULE | Freq: Every day | ORAL | Status: DC
  Administered 2020-05-27 – 2020-05-28 (×2): 180 mg via ORAL
  Filled 2020-05-27 (×2): qty 1

## 2020-05-27 NOTE — ED Provider Notes (Signed)
[facility] EMERGENCY DEPARTMENT Provider Note   CSN: [id number] Arrival date & time: 05/26/20  2321     History Chief Complaint  Patient presents with  . Shortness of Breath    Martin [name] is a 59 y.o. male.  Patient presents to the emergency department for evaluation of chest pain and shortness of breath.  Patient reports that symptoms began yesterday morning.  He has been having mostly exertional central chest pain associated with shortness of breath.  No leg swelling.  No palpitations.  No cough or infectious symptoms.        Past Medical History:  Diagnosis Date  . Cardiomegaly    LVH  . Cataract   . Hemorrhoids   . Hyperlipidemia   . Hypertension   . Hypothyroidism   . Lower GI bleeding   . OSA (obstructive sleep apnea)    mild OSA with an AHI of 8.1/hr but severe during REM sleep with AHI 43/hr.  He had O2 desats as low as 85%.  [name] PAF (paroxysmal atrial fibrillation) (HCC)    in setting of acute cholecystitis.  No anticoagulation due to setting of acute illness and CHADS2VAS score of 1    Patient Active Problem List   Diagnosis Date Noted  . Acute pulmonary embolism (HCC) 05/27/2020  . SOB (shortness of breath) 05/27/2020  . Atypical chest pain 05/27/2020  . Hypokalemia 05/27/2020  . COVID-19 virus infection 05/27/2020  . OSA (obstructive sleep apnea)   . PAF (paroxysmal atrial fibrillation) (HCC)   . History of cholecystectomy   . Acute lower GI bleeding 06/05/2019  . Preoperative clearance   . Abdominal pain 05/30/2019  . Acute cholecystitis 05/30/2019  . ARF (acute renal failure) (HCC) 05/30/2019  . Atrial fibrillation with RVR (HCC) 05/30/2019  . History of BPH 04/03/2016  . Allergic rhinitis due to pollen 02/18/2015  . Other male erectile dysfunction 02/18/2015  . Cataract 02/15/2014  . Hemorrhoids 02/10/2013  . Hypertension 03/17/2011  . Hyperlipidemia with target LDL less than 100 03/17/2011  . LVH (left ventricular  hypertrophy) due to hypertensive disease 03/17/2011  . Hypothyroid 03/17/2011    Past Surgical History:  Procedure Laterality Date  . CHOLECYSTECTOMY N/A 06/01/2019   Procedure: LAPAROSCOPIC CHOLECYSTECTOMY;  Surgeon: Manus Rudd, MD;  Location: [facility] OR;  Service: General;  Laterality: N/A;  . DOPPLER ECHOCARDIOGRAPHY         Family History  Problem Relation Age of Onset  . Kidney disease Mother   . Hypertension Father   . Kidney disease Father   . Asthma Father     Social History   Tobacco Use  . Smoking status: Never Smoker  . Smokeless tobacco: Never Used  Substance Use Topics  . Alcohol use: Yes    Comment: Rare  . Drug use: No    Home Medications Prior to Admission medications   Medication Sig Start Date End Date Taking? Authorizing Provider  aspirin 81 MG tablet Take 81 mg by mouth daily.      [provider]  carvedilol (COREG) 25 MG tablet Take 1 tablet (25 mg total) by mouth 2 (two) times daily. 08/29/19   Quintella Reichert, MD  chlorthalidone (HYGROTON) 25 MG tablet Take 1 tablet (25 mg total) by mouth daily. 02/19/20   Quintella Reichert, MD  diltiazem (CARDIZEM CD) 360 MG 24 hr capsule Take 1 capsule (360 mg total) by mouth daily. 03/19/20   Tereso Newcomer T, PA-C  HYDROcodone-acetaminophen (NORCO/VICODIN) 5-325 MG tablet Take  1 tablet by mouth every 6 (six) hours as needed for moderate pain (From [facility] hospital).    [provider]  losartan (COZAAR) 100 MG tablet Take 1 tablet (100 mg total) by mouth daily. 02/19/20   Quintella Reichert, MD  pravastatin (PRAVACHOL) 40 MG tablet Take 1 tablet by mouth once daily 04/10/20   Ronnald Nian, MD    Allergies    Garlic and Shrimp [shellfish allergy]  Review of Systems   Review of Systems  Respiratory: Positive for shortness of breath.   Cardiovascular: Positive for chest pain.  All other systems reviewed and are negative.   Physical Exam Updated Vital Signs BP (!) 170/159   Pulse (!) 104   Temp 99  F (37.2 C) (Oral)   Resp 16   Ht 6' (1.829 m)   Wt 120.2 kg   SpO2 93%   BMI 35.94 kg/m   Physical Exam Vitals and nursing note reviewed.  Constitutional:      General: He is not in acute distress.    Appearance: Normal appearance. He is well-developed.  HENT:     Head: Normocephalic and atraumatic.     Right Ear: Hearing normal.     Left Ear: Hearing normal.     Nose: Nose normal.  Eyes:     Conjunctiva/sclera: Conjunctivae normal.     Pupils: Pupils are equal, round, and reactive to light.  Cardiovascular:     Rate and Rhythm: Regular rhythm.     Heart sounds: S1 normal and S2 normal. No murmur heard.  No friction rub. No gallop.   Pulmonary:     Effort: Pulmonary effort is normal. No respiratory distress.     Breath sounds: Normal breath sounds.  Chest:     Chest wall: No tenderness.  Abdominal:     General: Bowel sounds are normal.     Palpations: Abdomen is soft.     Tenderness: There is no abdominal tenderness. There is no guarding or rebound. Negative signs include Murphy's sign and McBurney's sign.     Hernia: No hernia is present.  Musculoskeletal:        General: Normal range of motion.     Cervical back: Normal range of motion and neck supple.  Skin:    General: Skin is warm and dry.     Findings: No rash.  Neurological:     Mental Status: He is alert and oriented to person, place, and time.     GCS: GCS eye subscore is 4. GCS verbal subscore is 5. GCS motor subscore is 6.     Cranial Nerves: No cranial nerve deficit.     Sensory: No sensory deficit.     Coordination: Coordination normal.  Psychiatric:        Speech: Speech normal.        Behavior: Behavior normal.        Thought Content: Thought content normal.     ED Results / Procedures / Treatments   Labs (all labs ordered are listed, but only abnormal results are displayed) Labs Reviewed  RESP PANEL BY RT-PCR (FLU A&B, COVID) ARPGX2 - Abnormal; Notable for the following components:       Result Value   SARS Coronavirus 2 by RT PCR POSITIVE (*)    All other components within normal limits  BASIC METABOLIC PANEL - Abnormal; Notable for the following components:   Potassium 3.4 (*)    Glucose, Bld 144 (*)    Creatinine, Ser 1.52 (*)  GFR, Estimated 52 (*)    All other components within normal limits  CBC - Abnormal; Notable for the following components:   RBC 4.18 (*)    Platelets 128 (*)    All other components within normal limits  D-DIMER, QUANTITATIVE (NOT AT [facility]) - Abnormal; Notable for the following components:   D-Dimer, Quant 1.55 (*)    All other components within normal limits  TROPONIN I (HIGH SENSITIVITY) - Abnormal; Notable for the following components:   Troponin I (High Sensitivity) 28 (*)    All other components within normal limits  TROPONIN I (HIGH SENSITIVITY) - Abnormal; Notable for the following components:   Troponin I (High Sensitivity) 24 (*)    All other components within normal limits  BRAIN NATRIURETIC PEPTIDE  HEPARIN LEVEL (UNFRACTIONATED)  MAGNESIUM  PHOSPHORUS  BASIC METABOLIC PANEL  CBC  C-REACTIVE PROTEIN  FERRITIN  FIBRINOGEN  PROCALCITONIN  LACTATE DEHYDROGENASE  TROPONIN I (HIGH SENSITIVITY)    EKG EKG Interpretation  Date/Time:  Sunday May 26 2020 23:24:21 EST Ventricular Rate:  119 PR Interval:  138 QRS Duration: 148 QT Interval:  356 QTC Calculation: 500 R Axis:   -43 Text Interpretation: Sinus tachycardia with Premature atrial complexes Left axis deviation Right bundle branch block Minimal voltage criteria for LVH, may be normal variant ( R in aVL ) Abnormal ECG Confirmed by Gilda Crease [phone]) on 05/27/2020 12:37:27 AM   Radiology DG Chest 2 View  Result Date: 05/26/2020 CLINICAL DATA:  Shortness of breath EXAM: CHEST - 2 VIEW COMPARISON:  None. FINDINGS: The heart size and mediastinal contours are within normal limits. Both lungs are clear. The visualized skeletal structures are unremarkable.  IMPRESSION: No active cardiopulmonary disease. Electronically Signed   By: Jonna Clark M.D.   On: 05/26/2020 23:48   CT ANGIO CHEST PE W OR WO CONTRAST  Result Date: 05/27/2020 CLINICAL DATA:  PE suspected increasing shortness of breath. Intermittent chest pain. EXAM: CT ANGIOGRAPHY CHEST WITH CONTRAST TECHNIQUE: Multidetector CT imaging of the chest was performed using the standard protocol during bolus administration of intravenous contrast. Multiplanar CT image reconstructions and MIPs were obtained to evaluate the vascular anatomy. CONTRAST:  61mL OMNIPAQUE IOHEXOL 350 MG/ML SOLN COMPARISON:  None. FINDINGS: Cardiovascular: Evaluation is limited by suboptimal contrast bolus timing in addition to streak artifact from the patient's left arm. There are questionable filling defects within the segmental and subsegmental branches of the right upper lobe (axial series 7, image 109). The size of the main pulmonary artery is normal. Heart size is mildly enlarged. There are coronary artery calcifications. Minimal atherosclerotic changes are noted of the thoracic aorta without evidence for an aneurysm or dissection. There is no significant pericardial effusion. Mediastinum/Nodes: -- No mediastinal lymphadenopathy. -- No hilar lymphadenopathy. -- No axillary lymphadenopathy. -- No supraclavicular lymphadenopathy. -- Normal thyroid gland where visualized. -  Unremarkable esophagus. Lungs/Pleura: There are few streaky airspace opacities in the right lower lobe that are favored to represent atelectasis. The lung volumes are low. There is no pneumothorax. No significant pleural effusion. Upper Abdomen: Contrast bolus timing is not optimized for evaluation of the abdominal organs. The visualized portions of the organs of the upper abdomen are normal. Musculoskeletal: No chest wall abnormality. No bony spinal canal stenosis. Review of the MIP images confirms the above findings. IMPRESSION: 1. Evaluation is limited by  suboptimal contrast bolus timing in addition to streak artifact from the patient's left arm. Given this limitation, there are questionable filling defects within the segmental  and subsegmental branches of the right upper lobe. Pulmonary embolism is not excluded. Correlation with D-dimer is recommended. 2. Low lung volumes with streaky airspace opacities in the right lower lobe that are favored to represent atelectasis. Aortic Atherosclerosis (ICD10-I70.0). Electronically Signed   By: Katherine Mantlehristopher  Green M.D.   On: 05/27/2020 03:27    Procedures Procedures (including critical care time)  Medications Ordered in ED Medications  heparin ADULT infusion 100 units/mL (25000 units/23450mL sodium chloride 0.45%) (1,700 Units/hr Intravenous New Bag/Given 05/27/20 0428)  HYDROcodone-acetaminophen (NORCO/VICODIN) 5-325 MG per tablet 1 tablet (has no administration in time range)  carvedilol (COREG) tablet 25 mg (has no administration in time range)  losartan (COZAAR) tablet 100 mg (has no administration in time range)  pravastatin (PRAVACHOL) tablet 40 mg (has no administration in time range)  0.9 %  sodium chloride infusion ( Intravenous New Bag/Given 05/27/20 0625)  acetaminophen (TYLENOL) tablet 650 mg (has no administration in time range)    Or  acetaminophen (TYLENOL) suppository 650 mg (has no administration in time range)  albuterol (VENTOLIN HFA) 108 (90 Base) MCG/ACT inhaler 1-2 puff (has no administration in time range)  iohexol (OMNIPAQUE) 350 MG/ML injection 75 mL (75 mLs Intravenous Contrast Given 05/27/20 0319)  heparin bolus via infusion 5,000 Units (5,000 Units Intravenous Bolus from Bag 05/27/20 0428)  potassium chloride SA (KLOR-CON) CR tablet 40 mEq (40 mEq Oral Given 05/27/20 [id number])    ED Course  I have reviewed the triage vital signs and the nursing notes.  Pertinent labs & imaging results that were available during my care of the patient were reviewed by me and considered in my medical  decision making (see chart for details).    MDM Rules/Calculators/A&P                          Patient presents to the emergency department for evaluation of shortness of breath and chest pain.  Patient has been noticing symptoms since yesterday.  His symptoms are primarily exertional, improved with rest.  This raises concern for possible cardiac etiology.  EKG with new right bundle branch block compared to most recent EKG from 2020.  No ischemic changes.  Patient did have a elevated first troponin.  Second troponin, however, was slightly lower.  He was noted to be tachycardic here in the department, therefore D-dimer was added on.  This was elevated.  CT angiography performed.  CT contrast timing was suboptimal but there is suggestion of PE present.  Will initiate heparin.  Likely will require repeat CT or VQ scan to confirm. Final Clinical Impression(s) / ED Diagnoses Final diagnoses:  Acute pulmonary embolism, unspecified pulmonary embolism type, unspecified whether acute cor pulmonale present [facility](HCC)    Rx / DC Orders ED Discharge Orders    None       Gilda CreasePollina, Tabitha Tupper J, MD 05/27/20 [phone]

## 2020-05-27 NOTE — ED Notes (Signed)
Dinner Tray Ordered @ 1727. °

## 2020-05-27 NOTE — Progress Notes (Signed)
  Echocardiogram 2D Echocardiogram has been performed.  Eliah Ozawa G Naftali Carchi 05/27/2020, 2:07 PM

## 2020-05-27 NOTE — Progress Notes (Signed)
PROGRESS NOTE                                                                             PROGRESS NOTE                                                                                                                                                                                                             Patient Demographics:    Martin [name], is a 59 y.o. male, DOB - 01/14/1961, WUJ:[id number]  Outpatient Primary MD for the patient is Martin [name], Martin C, MD    LOS - 0  Admit date - 05/26/2020    Chief Complaint  Patient presents with  . Shortness of Breath       Brief Narrative    This is a no charge note as patient was seen and admitted earlier today, chart, imaging and labs were reviewed, patient was seen and examined.  HPI: Martin [name] is a 59 y.o. male with medical history significant for hypertension, hyperlipidemia, paroxysmal atrial fibrillation not chronically anticoagulated, who is admitted to [facility] on 05/26/2020 with suspected acute pulmonary embolism after presenting from home [facility] complaining of shortness of breath.   The patient reports 1 to 2 days of shortness of breath in the absence of any associated orthopnea, PND, or peripheral edema.  He notes associated mild nonproductive cough over that time, in the absence of any hemoptysis.  He also reports 1 to 2 days of intermittent anterior chest discomfort over the sternum, which he reports is limited to when he is coughing or taking a deep breath in.  He reports that this chest discomfort is sharp and nonradiating.  He reports that the chest discomfort is nonexertional, and denies any associated palpitations, diaphoresis, nausea, vomiting, presyncope, or syncope.  Denies any personal or family history of pulmonary embolism.  He reports left rotator cuff surgery in August 2021, and reports subsequent diminished ambulatory activity since  that time.  Denies any additional recent surgeries.  Also denies  any recent trauma or travel.  No known underlying history of malignancy.  Denies any recent lower extremity erythema or calf tenderness.   Denies any associated subjective fever, chills, rigors, or generalized myalgias.  Denies any recent headache, neck stiffness, rhinorrhea, rhinitis, sore throat, wheezing, abdominal pain, diarrhea, or rash.  No recent known COVID-19 exposures..  Past medical history is notable for history of paroxysmal atrial fibrillation, which appears to be limited to a single episode of atrial fibrillation in the setting of acute cholecystitis in December 2020.  Given suspected provoked single episode of paroxysmal atrial fibrillation in the context of acute cholecystitis as well as CHA2DS2-VASc score of 1, patient reports that he has not been subsequently formally anticoagulated.  Of note, echocardiogram on 05/31/2019 showed mild LVH, EF 55 to 60%, undetermined diastolic function, no evidence of focal wall motion normalities, and no evidence of significant valvular pathology.  In the setting of a single prior episode of atrial fibrillation, the patient reports that he follows with Dr. Mayford [name] as his outpatient cardiologist.     ED Course:  Vital signs in the ED were notable for the following: Temperature max 99; heart rate 10 9-1 16; blood pressure 146/95; respiratory rate 90-94; oxygen saturation 92 to 94% on room air.  Labs were notable for the following: BMP was notable for the following: Sodium 142, potassium 3.4, BUN 21, creatinine 1.52 relative to most recent prior value of 1.36 on 03/19/2020.  BNP 13.4.  High-sensitivity troponin high initially noted to be 28, with repeat value trending down to 24.  This is relative to most recent prior high-sensitivity troponin I value of 10 in December 2020.  CBC was notable for the following: White blood cell count of 4800, hemoglobin 13.  D-dimer 1.55.  Nasopharyngeal  COVID-19/influenza PCR was checked in the ED this evening, with result currently pending.  Chest x-ray showed no evidence of acute cardiopulmonary process, including no evidence of infiltrate, edema, effusion, or pneumothorax.  CTA of the chest was associated with a suboptimal study, although it showed questionable filling defects within the segmental and subsegmental branches of the right upper lobe.  Otherwise, CTA chest showed no evidence of acute cardiopulmonary process, including no evidence of infiltrate or edema.   Presenting EKG showed sinus tachycardia with PAC and heart rate 119, right bundle branch block with QRS 148 ms and QTc 500.  EKG also showed nonspecific T wave inversion in V1 as well as nonspecific less than 1 mm ST depression in V2 and V3, with ST depression in V3 similar to that noted in most recent prior EKG from 03/19/2020.  Today's EKG showed no evidence of ST elevation.   While in the ED, the following were administered: Heparin bolus followed by initiation of heparin drip.    Subjective:    [facility] today denies any chest pain, fever, chills or shortness of breath.   Assessment  & Plan :    Principal Problem:   Acute pulmonary embolism (HCC) Active Problems:   Hypertension   PAF (paroxysmal atrial fibrillation) (HCC)   SOB (shortness of breath)   Atypical chest pain   Hypokalemia   COVID-19 virus infection  Pulmonary embolism -Is most likely in the setting of COVID-19 infection, and more sedentary lifestyle recently, continue with heparin drip overnight, likely will be able to transition to DOAC in a.m.Martin [name] -Follow on venous Dopplers.     SpO2: 93 %  Recent Labs  Lab 05/26/20 2336 05/27/20 0200 05/27/20 0425 05/27/20 6301  WBC 4.8  --   --  4.4  PLT 128*  --   --  105*  BNP  --  13.4  --   --   DDIMER  --  1.55*  --   --   SARSCOV2NAA  --   --  POSITIVE*  --        ABG  No results found for: PHART, PCO2ART, PO2ART, HCO3, TCO2,  ACIDBASEDEF, O2SAT  [place] -Pleated  Hypertension -Continue with home medications  Hyperlipidemia -Continue with home medications   Accessible A. fib -At rate controlled, he is currently on heparin GTT.  COVID-19 infection -He is unvaccinated, he is asymptomatic, symptoms within 10 days, does not require any COVID-19 treatment, he is admitted for pulmonary embolism, and not COVID-19, he does qualify for monoclonal antibody, I have discussed at length with the patient, he is agreeable, will proceed with monoclonal antibody .    Condition - Extremely Guarded  Family Communication  :  Wife at bedside  Code Status :  Full  Consults  :  none  Procedures  :  none  Disposition Plan  :    Status is: Observation   IV treatments appropriate due to intensity of illness or inability to take PO  Dispo: The patient is from: Home              Anticipated d/c is to: Home              Anticipated d/c date is: 1 day              Patient currently is not medically stable to d/c.      DVT Prophylaxis  :  Heparin GTT  Lab Results  Component Value Date   PLT 105 (L) 05/27/2020    Diet :  Diet Order            Diet regular Room service appropriate? Yes; Fluid consistency: Thin  Diet effective now                  Inpatient Medications  Scheduled Meds: . carvedilol  25 mg Oral BID WC  . losartan  100 mg Oral Daily  . pravastatin  40 mg Oral Daily   Continuous Infusions: . sodium chloride 50 mL/hr at 05/27/20 0625  . sodium chloride    . bamlanivimab / etesevimab IVPB    . famotidine (PEPCID) IV    . heparin 1,700 Units/hr (05/27/20 0428)   PRN Meds:.sodium chloride, acetaminophen **OR** acetaminophen, albuterol, albuterol, diphenhydrAMINE, EPINEPHrine, famotidine (PEPCID) IV, HYDROcodone-acetaminophen, methylPREDNISolone (SOLU-MEDROL) injection  Antibiotics  :    Anti-infectives (From admission, onward)   None      Huey Bienenstock M.D on 05/27/2020 at  12:11 PM  To page go to [email or web address]  [facility] -  Office  [phone]      Objective:   Vitals:   05/27/20 0800 05/27/20 0830 05/27/20 0900 05/27/20 0930  BP: (!) 163/108 (!) 151/107 (!) 140/117 (!) 155/111  Pulse: 97 97 95 95  Resp: (!) 26 20 19 19   Temp:      TempSrc:      SpO2: 90% 94% 93% 93%  Weight:      Height:        Wt Readings from Last 3 Encounters:  05/26/20 120.2 kg  04/19/20 124.7 kg  03/25/20 125.2 kg    No intake or output data in the 24 hours ending 05/27/20 1211   Physical Exam  Awake Alert, No  new F.N deficits, Normal affect Symmetrical Chest wall movement, Good air movement bilaterally, CTAB RRR,No Gallops,Rubs or new Murmurs, No Parasternal Heave +ve B.Sounds, Abd Soft, No tenderness,  No rebound - guarding or rigidity. No Cyanosis, Clubbing or edema, No new Rash or bruise      Data Review:    CBC Recent Labs  Lab 05/26/20 2336 05/27/20 0714  WBC 4.8 4.4  HGB 13.0 12.1*  HCT 40.7 38.4*  PLT 128* 105*  MCV 97.4 97.5  MCH 31.1 30.7  MCHC 31.9 31.5  RDW 13.8 13.8    Recent Labs  Lab 05/26/20 2336 05/27/20 0200 05/27/20 0714  NA 142  --  141  K 3.4*  --  3.7  CL 104  --  103  CO2 25  --  27  GLUCOSE 144*  --  155*  BUN 21  --  21  CREATININE 1.52*  --  1.53*  CALCIUM 9.0  --  8.6*  MG  --   --  1.7  DDIMER  --  1.55*  --   BNP  --  13.4  --     ------------------------------------------------------------------------------------------------------------------ No results for input(s): CHOL, HDL, LDLCALC, TRIG, CHOLHDL, LDLDIRECT in the last 72 hours.  Lab Results  Component Value Date   HGBA1C 5.0 04/03/2016   ------------------------------------------------------------------------------------------------------------------ No results for input(s): TSH, T4TOTAL, T3FREE, THYROIDAB in the last 72 hours.  Invalid input(s): FREET3  Cardiac Enzymes No results for input(s): CKMB, TROPONINI, MYOGLOBIN in the  last 168 hours.  Invalid input(s): CK ------------------------------------------------------------------------------------------------------------------    Component Value Date/Time   BNP 13.4 05/27/2020 0200    Micro Results Recent Results (from the past 240 hour(s))  Resp Panel by RT-PCR (Flu A&B, Covid) Nasopharyngeal Swab     Status: Abnormal   Collection Time: 05/27/20  4:25 AM   Specimen: Nasopharyngeal Swab; Nasopharyngeal(NP) swabs in vial transport medium  Result Value Ref Range Status   SARS Coronavirus 2 by RT PCR POSITIVE (A) NEGATIVE Final    Comment: RESULT CALLED TO, READ BACK BY AND VERIFIED WITH: J. DODD,RN [id number] 05/27/2020 T. TYSOR (NOTE) SARS-CoV-2 target nucleic acids are DETECTED.  The SARS-CoV-2 RNA is generally detectable in upper respiratory specimens during the acute phase of infection. Positive results are indicative of the presence of the identified virus, but do not rule out bacterial infection or co-infection with other pathogens not detected by the test. Clinical correlation with patient history and other diagnostic information is necessary to determine patient infection status. The expected result is Negative.  Fact Sheet for Patients: BloggerCourse.com  Fact Sheet for Healthcare Providers: SeriousBroker.it  This test is not yet approved or cleared by the [place] FDA and  has been authorized for detection and/or diagnosis of SARS-CoV-2 by FDA under an Emergency Use Authorization (EUA).  This EUA will remain in effect (meaning this test can  be used) for the duration of  the COVID-19 declaration under Section 564(b)(1) of the Act, 21 U.S.C. section 360bbb-3(b)(1), unless the authorization is terminated or revoked sooner.     Influenza A by PCR NEGATIVE NEGATIVE Final   Influenza B by PCR NEGATIVE NEGATIVE Final    Comment: (NOTE) The Xpert Xpress SARS-CoV-2/FLU/RSV plus assay is intended  as an aid in the diagnosis of influenza from Nasopharyngeal swab specimens and should not be used as a sole basis for treatment. Nasal washings and aspirates are unacceptable for Xpert Xpress SARS-CoV-2/FLU/RSV testing.  Fact Sheet for Patients: BloggerCourse.com  Fact Sheet for Healthcare Providers: SeriousBroker.it  This test is not yet approved or cleared by the [place] and has been authorized for detection and/or diagnosis of SARS-CoV-2 by FDA under an Emergency Use Authorization (EUA). This EUA will remain in effect (meaning this test can be used) for the duration of the COVID-19 declaration under Section 564(b)(1) of the Act, 21 U.S.C. section 360bbb-3(b)(1), unless the authorization is terminated or revoked.  Performed at [facility], [address], [place], [place] [address]     Radiology Reports DG Chest 2 View  Result Date: 05/26/2020 CLINICAL DATA:  Shortness of breath EXAM: CHEST - 2 VIEW COMPARISON:  None. FINDINGS: The heart size and mediastinal contours are within normal limits. Both lungs are clear. The visualized skeletal structures are unremarkable. IMPRESSION: No active cardiopulmonary disease. Electronically Signed   By: Jonna Clark M.D.   On: 05/26/2020 23:48   CT ANGIO CHEST PE W OR WO CONTRAST  Result Date: 05/27/2020 CLINICAL DATA:  PE suspected increasing shortness of breath. Intermittent chest pain. EXAM: CT ANGIOGRAPHY CHEST WITH CONTRAST TECHNIQUE: Multidetector CT imaging of the chest was performed using the standard protocol during bolus administration of intravenous contrast. Multiplanar CT image reconstructions and MIPs were obtained to evaluate the vascular anatomy. CONTRAST:  75mL OMNIPAQUE IOHEXOL 350 MG/ML SOLN COMPARISON:  None. FINDINGS: Cardiovascular: Evaluation is limited by suboptimal contrast bolus timing in addition to streak artifact from the patient's left arm. There are  questionable filling defects within the segmental and subsegmental branches of the right upper lobe (axial series 7, image 109). The size of the main pulmonary artery is normal. Heart size is mildly enlarged. There are coronary artery calcifications. Minimal atherosclerotic changes are noted of the thoracic aorta without evidence for an aneurysm or dissection. There is no significant pericardial effusion. Mediastinum/Nodes: -- No mediastinal lymphadenopathy. -- No hilar lymphadenopathy. -- No axillary lymphadenopathy. -- No supraclavicular lymphadenopathy. -- Normal thyroid gland where visualized. -  Unremarkable esophagus. Lungs/Pleura: There are few streaky airspace opacities in the right lower lobe that are favored to represent atelectasis. The lung volumes are low. There is no pneumothorax. No significant pleural effusion. Upper Abdomen: Contrast bolus timing is not optimized for evaluation of the abdominal organs. The visualized portions of the organs of the upper abdomen are normal. Musculoskeletal: No chest wall abnormality. No bony spinal canal stenosis. Review of the MIP images confirms the above findings. IMPRESSION: 1. Evaluation is limited by suboptimal contrast bolus timing in addition to streak artifact from the patient's left arm. Given this limitation, there are questionable filling defects within the segmental and subsegmental branches of the right upper lobe. Pulmonary embolism is not excluded. Correlation with D-dimer is recommended. 2. Low lung volumes with streaky airspace opacities in the right lower lobe that are favored to represent atelectasis. Aortic Atherosclerosis (ICD10-I70.0). Electronically Signed   By: Katherine Mantle M.D.   On: 05/27/2020 03:27

## 2020-05-27 NOTE — ED Notes (Signed)
Pt states pain is tolerable at this time,rates chest pain a 5/10, denies need for pain medications  Provider meals tray and assisted to sitting position to eat.  While eating pt accidenlty pulled out R AC PIV

## 2020-05-27 NOTE — Progress Notes (Signed)
ANTICOAGULATION CONSULT NOTE Pharmacy Consult for Heparin Indication: pulmonary embolus  Allergies  Allergen Reactions  . Garlic Swelling  . Shrimp [Shellfish Allergy] Swelling    Patient Measurements: Height: 6' (182.9 cm) Weight: 120.2 kg (265 lb) IBW/kg (Calculated) : 77.6 Heparin Dosing Weight: 105 kg  Vital Signs: Temp: 98.7 F (37.1 C) (12/06 1400) Temp Source: Oral (12/06 1400) BP: 160/111 (12/06 2300) Pulse Rate: 86 (12/06 2300)  Labs: Recent Labs    05/26/20 2336 05/27/20 0200 05/27/20 0714 05/27/20 1001 05/27/20 1610 05/27/20 2215  HGB 13.0  --  12.1*  --   --   --   HCT 40.7  --  38.4*  --   --   --   PLT 128*  --  105*  --   --   --   HEPARINUNFRC  --   --   --  0.65 0.33 0.54  CREATININE 1.52*  --  1.53*  --   --   --   TROPONINIHS 28* 24* 25*  --   --   --     Estimated Creatinine Clearance: 67.8 mL/min (A) (by C-G formula based on SCr of 1.53 mg/dL (H)).   Medical History: Past Medical History:  Diagnosis Date  . Cardiomegaly    LVH  . Cataract   . Hemorrhoids   . Hyperlipidemia   . Hypertension   . Hypothyroidism   . Lower GI bleeding   . OSA (obstructive sleep apnea)    mild OSA with an AHI of 8.1/hr but severe during REM sleep with AHI 43/hr.  He had O2 desats as low as 85%.  [name] PAF (paroxysmal atrial fibrillation) (HCC)    in setting of acute cholecystitis.  No anticoagulation due to setting of acute illness and CHADS2VAS score of 1    Assessment: 59 y.o. male with history of afib not on anticoagulation PTA presented COVID-19+. D-dimer 1.55, CTA could not exclude PE. LE dopplers negative for DVT. Pharmacy was consulted to dose heparin for PE.   12/6 PM update:  Heparin level in therapeutic range x 3   Goal of Therapy:  Heparin level 0.3-0.7 units/ml Monitor platelets by anticoagulation protocol: Yes   Plan:  Cont heparin infusion to 1750 units/hr Monitor daily heparin level, CBC Monitor for signs/symptoms of bleeding F/U  transition to oral anticoagulant when able  Abran Duke, PharmD, BCPS Clinical Pharmacist Phone: [phone]

## 2020-05-27 NOTE — ED Notes (Signed)
Lunch Tray Ordered @ 1039. 

## 2020-05-27 NOTE — H&P (Signed)
History and Physical    PLEASE NOTE THAT DRAGON DICTATION SOFTWARE WAS USED IN THE CONSTRUCTION OF THIS NOTE.   Lakeview [name] LDJ:[id number] DOB: Dec 15, 1960 DOA: 05/26/2020  PCP: Ronnald Nian, MD Patient coming from: home   I have personally briefly reviewed patient's old medical records in [facility]  Chief Complaint: Shortness of breath  HPI: Martin [name] is a 59 y.o. male with medical history significant for hypertension, hyperlipidemia, paroxysmal atrial fibrillation not chronically anticoagulated, who is admitted to [facility] on 05/26/2020 with suspected acute pulmonary embolism after presenting from home to [facility] Emergency Department complaining of shortness of breath.   The patient reports 1 to 2 days of shortness of breath in the absence of any associated orthopnea, PND, or peripheral edema.  He notes associated mild nonproductive cough over that time, in the absence of any hemoptysis.  He also reports 1 to 2 days of intermittent anterior chest discomfort over the sternum, which he reports is limited to when he is coughing or taking a deep breath in.  He reports that this chest discomfort is sharp and nonradiating.  He reports that the chest discomfort is nonexertional, and denies any associated palpitations, diaphoresis, nausea, vomiting, presyncope, or syncope.  Denies any personal or family history of pulmonary embolism.  He reports left rotator cuff surgery in August 2021, and reports subsequent diminished ambulatory activity since that time.  Denies any additional recent surgeries.  Also denies any recent trauma or travel.  No known underlying history of malignancy.  Denies any recent lower extremity erythema or calf tenderness.   Denies any associated subjective fever, chills, rigors, or generalized myalgias.  Denies any recent headache, neck stiffness, rhinorrhea, rhinitis, sore throat, wheezing, abdominal pain, diarrhea, or rash.  No recent  known COVID-19 exposures..  Past medical history is notable for history of paroxysmal atrial fibrillation, which appears to be limited to a single episode of atrial fibrillation in the setting of acute cholecystitis in December 2020.  Given suspected provoked single episode of paroxysmal atrial fibrillation in the context of acute cholecystitis as well as CHA2DS2-VASc score of 1, patient reports that he has not been subsequently formally anticoagulated.  Of note, echocardiogram on 05/31/2019 showed mild LVH, EF 55 to 60%, undetermined diastolic function, no evidence of focal wall motion normalities, and no evidence of significant valvular pathology.  In the setting of a single prior episode of atrial fibrillation, the patient reports that he follows with Dr. Mayford Knife as his outpatient cardiologist.     ED Course:  Vital signs in the ED were notable for the following: Temperature max 99; heart rate 10 9-1 16; blood pressure 146/95; respiratory rate 90-94; oxygen saturation 92 to 94% on room air.  Labs were notable for the following: BMP was notable for the following: Sodium 142, potassium 3.4, BUN 21, creatinine 1.52 relative to most recent prior value of 1.36 on 03/19/2020.  BNP 13.4.  High-sensitivity troponin high initially noted to be 28, with repeat value trending down to 24.  This is relative to most recent prior high-sensitivity troponin I value of 10 in December 2020.  CBC was notable for the following: White blood cell count of 4800, hemoglobin 13.  D-dimer 1.55.  Nasopharyngeal COVID-19/influenza PCR was checked in the ED this evening, with result currently pending.  Chest x-ray showed no evidence of acute cardiopulmonary process, including no evidence of infiltrate, edema, effusion, or pneumothorax.  CTA of the chest was associated with a suboptimal study,  although it showed questionable filling defects within the segmental and subsegmental branches of the right upper lobe.  Otherwise, CTA chest  showed no evidence of acute cardiopulmonary process, including no evidence of infiltrate or edema.   Presenting EKG showed sinus tachycardia with PAC and heart rate 119, right bundle branch block with QRS 148 ms and QTc 500.  EKG also showed nonspecific T wave inversion in V1 as well as nonspecific less than 1 mm ST depression in V2 and V3, with ST depression in V3 similar to that noted in most recent prior EKG from 03/19/2020.  Today's EKG showed no evidence of ST elevation.   While in the ED, the following were administered: Heparin bolus followed by initiation of heparin drip.    Review of Systems: As per HPI otherwise 10 point review of systems negative.   Past Medical History:  Diagnosis Date  . Cardiomegaly    LVH  . Cataract   . Hemorrhoids   . Hyperlipidemia   . Hypertension   . Hypothyroidism   . Lower GI bleeding   . OSA (obstructive sleep apnea)    mild OSA with an AHI of 8.1/hr but severe during REM sleep with AHI 43/hr.  He had O2 desats as low as 85%.  [name] PAF (paroxysmal atrial fibrillation) (HCC)    in setting of acute cholecystitis.  No anticoagulation due to setting of acute illness and CHADS2VAS score of 1    Past Surgical History:  Procedure Laterality Date  . CHOLECYSTECTOMY N/A 06/01/2019   Procedure: LAPAROSCOPIC CHOLECYSTECTOMY;  Surgeon: Manus Rudd, MD;  Location: [facility] OR;  Service: General;  Laterality: N/A;  . DOPPLER ECHOCARDIOGRAPHY      Social History:  reports that he has never smoked. He has never used smokeless tobacco. He reports current alcohol use. He reports that he does not use drugs.   Allergies  Allergen Reactions  . Garlic Swelling  . Shrimp [Shellfish Allergy] Swelling    Family History  Problem Relation Age of Onset  . Kidney disease Mother   . Hypertension Father   . Kidney disease Father   . Asthma Father     Prior to Admission medications   Medication Sig Start Date End Date Taking? Authorizing Provider  aspirin 81 MG  tablet Take 81 mg by mouth daily.      [provider]  carvedilol (COREG) 25 MG tablet Take 1 tablet (25 mg total) by mouth 2 (two) times daily. 08/29/19   Quintella Reichert, MD  chlorthalidone (HYGROTON) 25 MG tablet Take 1 tablet (25 mg total) by mouth daily. 02/19/20   Quintella Reichert, MD  diltiazem (CARDIZEM CD) 360 MG 24 hr capsule Take 1 capsule (360 mg total) by mouth daily. 03/19/20   Tereso Newcomer T, PA-C  HYDROcodone-acetaminophen (NORCO/VICODIN) 5-325 MG tablet Take 1 tablet by mouth every 6 (six) hours as needed for moderate pain (From [facility] hospital).    [provider]  losartan (COZAAR) 100 MG tablet Take 1 tablet (100 mg total) by mouth daily. 02/19/20   Quintella Reichert, MD  pravastatin (PRAVACHOL) 40 MG tablet Take 1 tablet by mouth once daily 04/10/20   Ronnald Nian, MD     Objective    Physical Exam: Vitals:   05/26/20 2331 05/27/20 0252  BP: (!) 146/95 (!) 147/96  Pulse: (!) 116 (!) 109  Resp: (!) 24 20  Temp: 99 F (37.2 C)   TempSrc: Oral   SpO2: 94% 92%  Weight: 120.2  kg   Height: 6' (1.829 m)     General: appears to be stated age; alert, oriented Skin: warm, dry, no rash Head:  AT/[place] Mouth:  Oral mucosa membranes appear moist, normal dentition Neck: supple; trachea midline Heart: Mildly tachycardic, but regular; did not appreciate any M/R/G Lungs: CTAB, did not appreciate any wheezes, rales, or rhonchi Abdomen: + BS; soft, ND, NT Vascular: 2+ pedal pulses b/l; 2+ radial pulses b/l Extremities: no peripheral edema, no muscle wasting Neuro: strength and sensation intact in upper and lower extremities b/l   Labs on Admission: I have personally reviewed following labs and imaging studies  CBC: Recent Labs  Lab 05/26/20 2336  WBC 4.8  HGB 13.0  HCT 40.7  MCV 97.4  PLT 128*   Basic Metabolic Panel: Recent Labs  Lab 05/26/20 2336  NA 142  K 3.4*  CL 104  CO2 25  GLUCOSE 144*  BUN 21  CREATININE 1.52*  CALCIUM 9.0    GFR: Estimated Creatinine Clearance: 68.3 mL/min (A) (by C-G formula based on SCr of 1.52 mg/dL (H)). Liver Function Tests: No results for input(s): AST, ALT, ALKPHOS, BILITOT, PROT, ALBUMIN in the last 168 hours. No results for input(s): LIPASE, AMYLASE in the last 168 hours. No results for input(s): AMMONIA in the last 168 hours. Coagulation Profile: No results for input(s): INR, PROTIME in the last 168 hours. Cardiac Enzymes: No results for input(s): CKTOTAL, CKMB, CKMBINDEX, TROPONINI in the last 168 hours. BNP (last 3 results) No results for input(s): PROBNP in the last 8760 hours. HbA1C: No results for input(s): HGBA1C in the last 72 hours. CBG: No results for input(s): GLUCAP in the last 168 hours. Lipid Profile: No results for input(s): CHOL, HDL, LDLCALC, TRIG, CHOLHDL, LDLDIRECT in the last 72 hours. Thyroid Function Tests: No results for input(s): TSH, T4TOTAL, FREET4, T3FREE, THYROIDAB in the last 72 hours. Anemia Panel: No results for input(s): VITAMINB12, FOLATE, FERRITIN, TIBC, IRON, RETICCTPCT in the last 72 hours. Urine analysis:    Component Value Date/Time   COLORURINE AMBER (A) 05/30/2019 1239   APPEARANCEUR HAZY (A) 05/30/2019 1239   LABSPEC 1.021 05/30/2019 1239   PHURINE 5.0 05/30/2019 1239   GLUCOSEU NEGATIVE 05/30/2019 1239   HGBUR NEGATIVE 05/30/2019 1239   BILIRUBINUR NEGATIVE 05/30/2019 1239   BILIRUBINUR n 04/03/2016 0856   KETONESUR NEGATIVE 05/30/2019 1239   PROTEINUR 30 (A) 05/30/2019 1239   UROBILINOGEN negative 04/03/2016 0856   NITRITE NEGATIVE 05/30/2019 1239   LEUKOCYTESUR NEGATIVE 05/30/2019 1239    Radiological Exams on Admission: DG Chest 2 View  Result Date: 05/26/2020 CLINICAL DATA:  Shortness of breath EXAM: CHEST - 2 VIEW COMPARISON:  None. FINDINGS: The heart size and mediastinal contours are within normal limits. Both lungs are clear. The visualized skeletal structures are unremarkable. IMPRESSION: No active cardiopulmonary  disease. Electronically Signed   By: Jonna Clark M.D.   On: 05/26/2020 23:48   CT ANGIO CHEST PE W OR WO CONTRAST  Result Date: 05/27/2020 CLINICAL DATA:  PE suspected increasing shortness of breath. Intermittent chest pain. EXAM: CT ANGIOGRAPHY CHEST WITH CONTRAST TECHNIQUE: Multidetector CT imaging of the chest was performed using the standard protocol during bolus administration of intravenous contrast. Multiplanar CT image reconstructions and MIPs were obtained to evaluate the vascular anatomy. CONTRAST:  75mL OMNIPAQUE IOHEXOL 350 MG/ML SOLN COMPARISON:  None. FINDINGS: Cardiovascular: Evaluation is limited by suboptimal contrast bolus timing in addition to streak artifact from the patient's left arm. There are questionable filling defects within the  segmental and subsegmental branches of the right upper lobe (axial series 7, image 109). The size of the main pulmonary artery is normal. Heart size is mildly enlarged. There are coronary artery calcifications. Minimal atherosclerotic changes are noted of the thoracic aorta without evidence for an aneurysm or dissection. There is no significant pericardial effusion. Mediastinum/Nodes: -- No mediastinal lymphadenopathy. -- No hilar lymphadenopathy. -- No axillary lymphadenopathy. -- No supraclavicular lymphadenopathy. -- Normal thyroid gland where visualized. -  Unremarkable esophagus. Lungs/Pleura: There are few streaky airspace opacities in the right lower lobe that are favored to represent atelectasis. The lung volumes are low. There is no pneumothorax. No significant pleural effusion. Upper Abdomen: Contrast bolus timing is not optimized for evaluation of the abdominal organs. The visualized portions of the organs of the upper abdomen are normal. Musculoskeletal: No chest wall abnormality. No bony spinal canal stenosis. Review of the MIP images confirms the above findings. IMPRESSION: 1. Evaluation is limited by suboptimal contrast bolus timing in addition  to streak artifact from the patient's left arm. Given this limitation, there are questionable filling defects within the segmental and subsegmental branches of the right upper lobe. Pulmonary embolism is not excluded. Correlation with D-dimer is recommended. 2. Low lung volumes with streaky airspace opacities in the right lower lobe that are favored to represent atelectasis. Aortic Atherosclerosis (ICD10-I70.0). Electronically Signed   By: Katherine Mantle M.D.   On: 05/27/2020 03:27     EKG: Independently reviewed, with result as described above.    Assessment/Plan   Martin [name] is a 59 y.o. male with medical history significant for hypertension, hyperlipidemia, paroxysmal atrial fibrillation not chronically anticoagulated, who is admitted to [facility] on 05/26/2020 with suspected acute pulmonary embolism after presenting from home to [facility] Emergency Department complaining of shortness of breath.    Principal Problem:   Acute pulmonary embolism (HCC) Active Problems:   Hypertension   PAF (paroxysmal atrial fibrillation) (HCC)   SOB (shortness of breath)   Atypical chest pain   Hypokalemia    #) Acute pulmonary embolism: Suspected diagnosis on the basis of 1 to 2 days of shortness of breath associated with pleuritic chest discomfort, elevated D-dimer, and CTA chest demonstrating potential filling defects within the segmental and subsegmental branches of the right upper lobe, although within the confines of being associated with a suboptimal study, as further described above.  This would appear to represent a provoked acute pulmonary embolism in the context of recent surgery on his left rotator cuff as well as recent diminished ambulatory activity, as further described above.  No evidence of associated cor pulmonale or hypoxia at this time.  No evidence of associated hypotension warrant consideration for TPA administration.  Appears to be the patient's first PE.  Was  started on heparin drip in the ED, which will be continued for now, with anticipation of transitioning to DOAC in the morning.  Of note, aside from daily baby aspirin, the patient is on no blood thinning agents at home.  Given that there was an element of suboptimal quality associated with this evening CTA chest, will check venous Doppler of the bilateral lower extremities.  Presentation is also associated with mildly elevated troponin, which appears consistent with the suspected underlying acute pulmonary embolism, as further described below.   Plan: Heparin drip overnight, as above.  Anticipate transition to DOAC in the morning.  Monitor on telemetry.  Monitor for development of hypotension.  As needed sublingual oxygen in order to maintain oxygen  saturations greater than or equal to 92%.  Continue home as needed Norco.  Monitor continuous pulse oximetry.  Incentive spirometry.  Bilateral venous Doppler, as above.  We will also check echocardiogram in the morning as means of further evaluation for interval focal wall motion abnormalities given mildly elevated troponin and chest discomfort, while also serving as means of evaluation for right-sided heart strain given suspected presenting acute PE.  Repeat CBC in the morning.     #) Atypical chest pain: The patient reports 1 to 2 days of intermittent anterior midline chest discomfort that appears to be pleuritic in nature, prompted by him worsened with cough as well as deep inspiration.  He denies any exertional component to his chest discomfort.  The patient's atypical chest pain is felt to be on the basis is suspected presenting acute pulmonary embolism, as above.  Presentation associated with mildly elevated troponin that is trending down, which is also consistent with acute pulmonary embolism.  EKG shows nonspecific T wave inversion in V1 as well as nonspecific less than 1 mm ST depression in V2, with no evidence of ST elevation.  Overall, ACS is felt to  be less likely relative to mild heart strain in the setting of suspected acute PE.  However, will closely monitor overnight on telemetry, with as needed EKG for additional chest discomfort, as well as further trending of troponin level.  We will also check echocardiogram for evaluation of interval focal wall motion abnormalities relative to most recent prior echocardiogram which was performed in December 2020.  Of note, presenting chest x-ray shows no evidence of acute cardiopulmonary process, including no evidence of pneumothorax, while CTA shows no evidence of acute cardiopulmonary process aside from questionable filling defects in the segmental and subsegmental branches of the right upper lobe, as further described above.  Plan: Continue heparin drip, although the indication for such at this time is on the basis of suspected acute pulmonary embolism, as above.  Continue home statin.  Continue home beta-blocker as well as home ARB.  Repeat troponin in the morning.  Monitor on telemetry.  Check echocardiogram, as above.  Add on serum magnesium level.  Work-up and management of mild presenting hypokalemia, as below.  Follow for results of nasopharyngeal COVID-19/influenza PCR were checked in the ED this evening.     #) Hypokalemia: Presenting labs reflect mild hypokalemia, with serum potassium level noted to be 3.4.  In the context of presenting mildly elevated troponin, will provide potassium supplementation, with goal to maintain serum potassium level of greater than equal to 4.0.   Plan: Potassium chloride for milliequivalents p.o. x1.  Repeat BMP in the morning.  Add on serum magnesium level.  Monitor telemetry.      #) Essential hypertension: Outpatient antihypertensive regimen includes Coreg, chlorthalidone, and diltiazem.  In the setting of suspected acute pulmonary, with potential preload dependent physiology, will hold home chlorthalidone for now.  No evidence of hypotension thus far and will  continue to closely monitor for development of such in the context of suspected presenting acute pulmonary embolism.  Plan: Hold home chlorthalidone and diltiazem for now.  Continue home beta-blocker.  Close monitoring of ensuing blood pressure via routine vital signs.      #) Hyperlipidemia: On pravastatin as an outpatient.  Plan: Continue home statin.      #) Paroxysmal atrial fibrillation: Documented history of single episode of paroxysmal atrial fibrillation in the setting of acute cholecystitis and December 2020, as further described above.  In the  setting of this single provoked episode with a CHA2DS2-VASc score of 1, the patient acknowledges that he has not been formally anticoagulated as an outpatient in the interval.  Purely from the standpoint of thromboembolic prophylaxis in the context of paroxysmal atrial fibrillation, there does not appear to be an indication for chronic anticoagulation at this time.  However, there appears to be a separate indication for formal anticoagulation in the context of suspected presenting acute pulmonary embolism, as further described above.  Presentation appears to be associated with sinus tachycardia at this time.  Most recent echo was performed in December 2020, with results as further described above.  Plan: Continue home beta-blocker.  Monitor on telemetry.  Add on serum magnesium level.  Monitor strict I's and O's and daily weights.  Repeat BMP in the morning.    DVT prophylaxis: Heparin drip, as above Code Status: Full code Family Communication: The patient's case was discussed with his wife, who was present at bedside. Disposition Plan: Per Rounding Team Consults called: none  Admission status: Observation; PCU    Of note, this patient was added by me to the following Admit List/Treatment Team: [email or web address]    PLEASE NOTE THAT DRAGON DICTATION SOFTWARE WAS USED IN THE CONSTRUCTION OF THIS NOTE.   Angie Fava DO [facility] Pager [phone] From 7PM- 7AM   05/27/2020, 3:58 AM

## 2020-05-27 NOTE — Progress Notes (Signed)
ANTICOAGULATION CONSULT NOTE - Initial Consult  Pharmacy Consult for Heparin Indication: pulmonary embolus  Allergies  Allergen Reactions  . Garlic Swelling  . Shrimp [Shellfish Allergy] Swelling    Patient Measurements: Height: 6' (182.9 cm) Weight: 120.2 kg (265 lb) IBW/kg (Calculated) : 77.6 Heparin Dosing Weight: 105 kg  Vital Signs: Temp: 99 F (37.2 C) (12/05 2331) Temp Source: Oral (12/05 2331) BP: 147/96 (12/06 0252) Pulse Rate: 109 (12/06 0252)  Labs: Recent Labs    05/26/20 2336 05/27/20 0200  HGB 13.0  --   HCT 40.7  --   PLT 128*  --   CREATININE 1.52*  --   TROPONINIHS 28* 24*    Estimated Creatinine Clearance: 68.3 mL/min (A) (by C-G formula based on SCr of 1.52 mg/dL (H)).   Medical History: Past Medical History:  Diagnosis Date  . Cardiomegaly    LVH  . Cataract   . Hemorrhoids   . Hyperlipidemia   . Hypertension   . Hypothyroidism   . Lower GI bleeding   . OSA (obstructive sleep apnea)    mild OSA with an AHI of 8.1/hr but severe during REM sleep with AHI 43/hr.  He had O2 desats as low as 85%.  [name] PAF (paroxysmal atrial fibrillation) (HCC)    in setting of acute cholecystitis.  No anticoagulation due to setting of acute illness and CHADS2VAS score of 1    Medications:  No current facility-administered medications on file prior to encounter.   Current Outpatient Medications on File Prior to Encounter  Medication Sig Dispense Refill  . aspirin 81 MG tablet Take 81 mg by mouth daily.      . carvedilol (COREG) 25 MG tablet Take 1 tablet (25 mg total) by mouth 2 (two) times daily. 180 tablet 3  . chlorthalidone (HYGROTON) 25 MG tablet Take 1 tablet (25 mg total) by mouth daily. 30 tablet 6  . diltiazem (CARDIZEM CD) 360 MG 24 hr capsule Take 1 capsule (360 mg total) by mouth daily. 90 capsule 1  . HYDROcodone-acetaminophen (NORCO/VICODIN) 5-325 MG tablet Take 1 tablet by mouth every 6 (six) hours as needed for moderate pain (From [facility]  hospital).    [name] losartan (COZAAR) 100 MG tablet Take 1 tablet (100 mg total) by mouth daily. 30 tablet 6  . pravastatin (PRAVACHOL) 40 MG tablet Take 1 tablet by mouth once daily 90 tablet 0     Assessment: 59 y.o. male with PE for heparin Goal of Therapy:  Heparin level 0.3-0.7 units/ml Monitor platelets by anticoagulation protocol: Yes   Plan:  Heparin 5000 units IV bolus, then start heparin 1700 units/hr Check heparin level in 6 hours.    Jozy Mcphearson, Gary Fleet 05/27/2020,3:57 AM

## 2020-05-27 NOTE — Progress Notes (Signed)
Presenting screening nasopharyngeal COVID-19 PCR result has now returned positive.  Will now update plan to include the following:    #) COVID-19 infection: Diagnosis on the basis of 1 to 2 days of progressive shortness of breath as well as new onset nonproductive cough, with presenting COVID-19 PCR performed in the ED this evening found to be positive.  Of note, presentation does not appear to be associated with acute hypoxic respiratory distress, as the patient is maintaining oxygen saturations in the mid 90s on room air.  Overall, does not appear that criteria met at the present time for patient's COVID-19 infection to be considered severe in nature.  Consequently, there does not appear to be an indication at the present time for initiation of dexamethasone per treatment guidance recommendations from [facility] health's Covid treatment committee.  As the patient's primary admitting diagnosis is suspected to be acute pulmonary embolism, as further described in the admitting H&P, will refrain from initiation of remdesivir for now, with plan to closely monitor ensuing in order to further evaluate and consider the possibility of initiation of this antiviral medication.  The patient is at increased risk for complicated clinical course relating to his COVID-19 infection on the basis of his comorbidity of hypertension.  No known chronic underlying pulmonary pathology.  Of note, he denies any underlying history of diabetes, and most recent echocardiogram shows no evidence of heart failure, as further described in the admission H&P.  Chest x-ray showed no evidence of acute cardiopulmonary process.  We will add on general inflammatory markers at this time.  Of note, in the absence of leukocytosis or fever, SIRS criteria are not met at this time for sepsis.    Plan: Airborne and contact precautions. Monitor continuous pulse oximetry. prn supplemental O2 to maintain O2 sats greater than or equal to 92%. Proning protocol  initiated. monitor on telemetry. PRN albuterol inhaler. PRN acetaminophen for fever. Refraining from initiation of dexamethasone and remdesivir for now, as above. Check inflammatory markers (fibrinogen, d dimer or fibrin derivatives, crp, ferritin, LDH).  Add on procalcitonin level.  We will follow for result of serum phosphorus level.  Monitor for results of morning CMP and CBC.  Flutter valve and incentive spirometer.  We will further assess the patient's COVID-19 vaccination status.     Babs Bertin, DO Hospitalist

## 2020-05-27 NOTE — Progress Notes (Signed)
ANTICOAGULATION CONSULT NOTE Pharmacy Consult for Heparin Indication: pulmonary embolus  Allergies  Allergen Reactions  . Garlic Swelling  . Shrimp [Shellfish Allergy] Swelling    Patient Measurements: Height: 6' (182.9 cm) Weight: 120.2 kg (265 lb) IBW/kg (Calculated) : 77.6 Heparin Dosing Weight: 105 kg  Vital Signs: Temp: 98.7 F (37.1 C) (12/06 1400) Temp Source: Oral (12/06 1400) BP: 130/99 (12/06 1615) Pulse Rate: 87 (12/06 1615)  Labs: Recent Labs    05/26/20 2336 05/27/20 0200 05/27/20 0714 05/27/20 1001 05/27/20 1610  HGB 13.0  --  12.1*  --   --   HCT 40.7  --  38.4*  --   --   PLT 128*  --  105*  --   --   HEPARINUNFRC  --   --   --  0.65 0.33  CREATININE 1.52*  --  1.53*  --   --   TROPONINIHS 28* 24* 25*  --   --     Estimated Creatinine Clearance: 67.8 mL/min (A) (by C-G formula based on SCr of 1.53 mg/dL (H)).   Medical History: Past Medical History:  Diagnosis Date  . Cardiomegaly    LVH  . Cataract   . Hemorrhoids   . Hyperlipidemia   . Hypertension   . Hypothyroidism   . Lower GI bleeding   . OSA (obstructive sleep apnea)    mild OSA with an AHI of 8.1/hr but severe during REM sleep with AHI 43/hr.  He had O2 desats as low as 85%.  [name] PAF (paroxysmal atrial fibrillation) (HCC)    in setting of acute cholecystitis.  No anticoagulation due to setting of acute illness and CHADS2VAS score of 1    Assessment: 59 y.o. male with history of afib not on anticoagulation PTA presented COVID-19+. D-dimer 1.55, CTA could not exclude PE. LE dopplers negative for DVT. Pharmacy was consulted to dose heparin for PE.   Confirmatory heparin level on heparin infusion at 1700 units/hr drawn ~6 hrs after initial therapeutic heparin level earlier today has drifted down to 0.33 units/ml, which is at the low end of the goal range for this pt. Hgb 12.1, plt down to 105. No current active bleed issues or issues with IV reported.  Goal of Therapy:  Heparin level  0.3-0.7 units/ml Monitor platelets by anticoagulation protocol: Yes   Plan:  Increase heparin infusion to 1750 units/hr Check heparin level in 6 hours Monitor daily heparin level, CBC Monitor for signs/symptoms of bleeding F/U transition to oral anticoagulant when able  Vicki Mallet, PharmD, BCPS, [facility] [place] Clinical Pharmacist 05/27/2020 5:01 PM

## 2020-05-27 NOTE — Progress Notes (Signed)
VASCULAR LAB    Bilateral lower extremity venous duplex has been performed.  See CV proc for preliminary results.   Brylan Seubert, RVT 05/27/2020, 1:14 PM

## 2020-05-27 NOTE — Progress Notes (Addendum)
ANTICOAGULATION CONSULT NOTE Pharmacy Consult for Heparin Indication: pulmonary embolus  Allergies  Allergen Reactions  . Garlic Swelling  . Shrimp [Shellfish Allergy] Swelling    Patient Measurements: Height: 6' (182.9 cm) Weight: 120.2 kg (265 lb) IBW/kg (Calculated) : 77.6 Heparin Dosing Weight: 105 kg  Vital Signs: BP: 155/111 (12/06 0930) Pulse Rate: 95 (12/06 0930)  Labs: Recent Labs    05/26/20 2336 05/27/20 0200 05/27/20 0714 05/27/20 1001  HGB 13.0  --  12.1*  --   HCT 40.7  --  38.4*  --   PLT 128*  --  105*  --   HEPARINUNFRC  --   --   --  0.65  CREATININE 1.52*  --  1.53*  --   TROPONINIHS 28* 24* 25*  --     Estimated Creatinine Clearance: 67.8 mL/min (A) (by C-G formula based on SCr of 1.53 mg/dL (H)).   Medical History: Past Medical History:  Diagnosis Date  . Cardiomegaly    LVH  . Cataract   . Hemorrhoids   . Hyperlipidemia   . Hypertension   . Hypothyroidism   . Lower GI bleeding   . OSA (obstructive sleep apnea)    mild OSA with an AHI of 8.1/hr but severe during REM sleep with AHI 43/hr.  He had O2 desats as low as 85%.  [name] PAF (paroxysmal atrial fibrillation) (HCC)    in setting of acute cholecystitis.  No anticoagulation due to setting of acute illness and CHADS2VAS score of 1    Medications:  No current facility-administered medications on file prior to encounter.   Current Outpatient Medications on File Prior to Encounter  Medication Sig Dispense Refill  . acetaminophen (TYLENOL) 500 MG tablet Take 500 mg by mouth every 6 (six) hours as needed for mild pain or headache.    [name] aspirin 81 MG tablet Take 81 mg by mouth daily.      . Carboxymethylcellul-Glycerin (CLEAR EYES FOR DRY EYES OP) Apply 1 drop to eye daily as needed (for dry eyes).    . carvedilol (COREG) 25 MG tablet Take 1 tablet (25 mg total) by mouth 2 (two) times daily. 180 tablet 3  . chlorthalidone (HYGROTON) 25 MG tablet Take 1 tablet (25 mg total) by mouth daily. 30  tablet 6  . diltiazem (CARDIZEM CD) 360 MG 24 hr capsule Take 1 capsule (360 mg total) by mouth daily. 90 capsule 1  . losartan (COZAAR) 100 MG tablet Take 1 tablet (100 mg total) by mouth daily. 30 tablet 6  . Multiple Vitamin (MULTIVITAMIN) tablet Take 1 tablet by mouth daily.    . pravastatin (PRAVACHOL) 40 MG tablet Take 1 tablet by mouth once daily (Patient taking differently: Take 40 mg by mouth daily. ) 90 tablet 0  . [DISCONTINUED] HYDROcodone-acetaminophen (NORCO/VICODIN) 5-325 MG tablet Take 1 tablet by mouth every 6 (six) hours as needed for moderate pain (From [facility]).       Assessment: 59 y.o. male with history of afib not on anticoagulation PTA presenting COVID-19+. D-dimer 1.95, CTA could not exclude PE. LE dopplers negative for DVT. Pharmacy consulted to dose heparin for PE. Initial heparin level therapeutic at 0.65. Hg 12.1, plt down to 105. No current active bleed issues reported.  Goal of Therapy:  Heparin level 0.3-0.7 units/ml Monitor platelets by anticoagulation protocol: Yes   Plan:  Continue heparin at 1700 units/hr Check confirmatory heparin level in 6 hours Monitor daily CBC, s/sx bleeding   Leia Alf, PharmD, BCPS  Please check AMION for all [facility] contact numbers Clinical Pharmacist 05/27/2020 1:57 PM

## 2020-05-28 ENCOUNTER — Other Ambulatory Visit (HOSPITAL_COMMUNITY): Payer: Self-pay | Admitting: Internal Medicine

## 2020-05-28 DIAGNOSIS — I10 Essential (primary) hypertension: Secondary | ICD-10-CM | POA: Diagnosis not present

## 2020-05-28 DIAGNOSIS — U071 COVID-19: Secondary | ICD-10-CM | POA: Diagnosis not present

## 2020-05-28 DIAGNOSIS — I2699 Other pulmonary embolism without acute cor pulmonale: Secondary | ICD-10-CM | POA: Diagnosis not present

## 2020-05-28 LAB — CBC
HCT: 35.9 % — ABNORMAL LOW (ref 39.0–52.0)
Hemoglobin: 11.7 g/dL — ABNORMAL LOW (ref 13.0–17.0)
MCH: 31.7 pg (ref 26.0–34.0)
MCHC: 32.6 g/dL (ref 30.0–36.0)
MCV: 97.3 fL (ref 80.0–100.0)
Platelets: 93 10*3/uL — ABNORMAL LOW (ref 150–400)
RBC: 3.69 MIL/uL — ABNORMAL LOW (ref 4.22–5.81)
RDW: 14 % (ref 11.5–15.5)
WBC: 3.6 10*3/uL — ABNORMAL LOW (ref 4.0–10.5)
nRBC: 0 % (ref 0.0–0.2)

## 2020-05-28 LAB — HEPARIN LEVEL (UNFRACTIONATED): Heparin Unfractionated: 0.35 IU/mL (ref 0.30–0.70)

## 2020-05-28 MED ORDER — APIXABAN 5 MG PO TABS: 5.0000 mg | ORAL_TABLET | Freq: Two times a day (BID) | ORAL | Status: DC

## 2020-05-28 MED ORDER — APIXABAN 5 MG PO TABS
10.0000 mg | ORAL_TABLET | Freq: Two times a day (BID) | ORAL | Status: DC
  Administered 2020-05-28: 10 mg via ORAL
  Filled 2020-05-28: qty 2

## 2020-05-28 MED ORDER — PANTOPRAZOLE SODIUM 40 MG PO TBEC: 40.0000 mg | DELAYED_RELEASE_TABLET | Freq: Every day | ORAL | 0 refills | Status: DC

## 2020-05-28 MED ORDER — APIXABAN (ELIQUIS) VTE STARTER PACK (10MG AND 5MG): ORAL_TABLET | ORAL | 0 refills | Status: DC

## 2020-05-28 MED ORDER — MAGNESIUM SULFATE IN D5W 1-5 GM/100ML-% IV SOLN
1.0000 g | Freq: Once | INTRAVENOUS | Status: AC
  Administered 2020-05-28: 1 g via INTRAVENOUS
  Filled 2020-05-28: qty 100

## 2020-05-28 MED FILL — ELIQUIS STARTER PACK 5 MG T: 5 | 28 days supply | Qty: 74 | Fill #0

## 2020-05-28 MED FILL — PANTOPRAZOLE SOD DR 40 MG T: 40 | 30 days supply | Qty: 30 | Fill #0

## 2020-05-28 NOTE — Discharge Planning (Signed)
RNCM consulted in regards to medication assistance.  Pt has insurance coverage and is not eligible for Medication Assistance Through [place] (MATCH) program. 

## 2020-05-28 NOTE — ED Notes (Signed)
Patient verbalizes understanding of discharge instructions. Opportunity for questioning and answers were provided. Pt discharged from ED. 

## 2020-05-28 NOTE — ED Notes (Signed)
Lunch Tray Ordered @ 1036. 

## 2020-05-28 NOTE — Discharge Summary (Signed)
[facility], is a 59 y.o. male  DOB Sep 15, 1960  MRN [id number].  Admission date:  05/26/2020  Admitting Physician  Angie Fava, DO  Discharge Date:  05/28/2020   Primary MD  Ronnald Nian, MD  Recommendations for primary care physician for things to follow:  -Patient will need full anticoagulation for a for total of 6 months, will defer further anticoagulation regarding A. fib to primary cardiologist.  Admission Diagnosis  Acute pulmonary embolism (HCC) [I26.99]   Discharge Diagnosis  Acute pulmonary embolism (HCC) [I26.99]    Principal Problem:   Acute pulmonary embolism (HCC) Active Problems:   Hypertension   PAF (paroxysmal atrial fibrillation) (HCC)   SOB (shortness of breath)   Atypical chest pain   Hypokalemia   COVID-19 virus infection      Past Medical History:  Diagnosis Date  . Cardiomegaly    LVH  . Cataract   . Hemorrhoids   . Hyperlipidemia   . Hypertension   . Hypothyroidism   . Lower GI bleeding   . OSA (obstructive sleep apnea)    mild OSA with an AHI of 8.1/hr but severe during REM sleep with AHI 43/hr.  He had O2 desats as low as 85%.  [name] PAF (paroxysmal atrial fibrillation) (HCC)    in setting of acute cholecystitis.  No anticoagulation due to setting of acute illness and CHADS2VAS score of 1    Past Surgical History:  Procedure Laterality Date  . CHOLECYSTECTOMY N/A 06/01/2019   Procedure: LAPAROSCOPIC CHOLECYSTECTOMY;  Surgeon: Manus Rudd, MD;  Location: [facility] OR;  Service: General;  Laterality: N/A;  . DOPPLER ECHOCARDIOGRAPHY         History of present illness and  Hospital Course:     Kindly see H&P for history of present illness and admission details, please review complete Labs, Consult reports and Test reports for all details in brief  HPI  from the history and physical done on the day of admission 05/27/2020   HPI: Martin [name]  is a 59 y.o. male with medical history significant for hypertension, hyperlipidemia, paroxysmal atrial fibrillation not chronically anticoagulated, who is admitted to [facility] on 05/26/2020 with suspected acute pulmonary embolism after presenting from home to [facility] Emergency Department complaining of shortness of breath.   The patient reports 1 to 2 days of shortness of breath in the absence of any associated orthopnea, PND, or peripheral edema.  He notes associated mild nonproductive cough over that time, in the absence of any hemoptysis.  He also reports 1 to 2 days of intermittent anterior chest discomfort over the sternum, which he reports is limited to when he is coughing or taking a deep breath in.  He reports that this chest discomfort is sharp and nonradiating.  He reports that the chest discomfort is nonexertional, and denies any associated palpitations, diaphoresis, nausea, vomiting, presyncope, or syncope.  Denies any personal or family history of pulmonary embolism.  He reports left rotator cuff surgery in August 2021, and reports subsequent  diminished ambulatory activity since that time.  Denies any additional recent surgeries.  Also denies any recent trauma or travel.  No known underlying history of malignancy.  Denies any recent lower extremity erythema or calf tenderness.   Denies any associated subjective fever, chills, rigors, or generalized myalgias.  Denies any recent headache, neck stiffness, rhinorrhea, rhinitis, sore throat, wheezing, abdominal pain, diarrhea, or rash.  No recent known COVID-19 exposures..  Past medical history is notable for history of paroxysmal atrial fibrillation, which appears to be limited to a single episode of atrial fibrillation in the setting of acute cholecystitis in December 2020.  Given suspected provoked single episode of paroxysmal atrial fibrillation in the context of acute cholecystitis as well as CHA2DS2-VASc score of 1,  patient reports that he has not been subsequently formally anticoagulated.  Of note, echocardiogram on 05/31/2019 showed mild LVH, EF 55 to 60%, undetermined diastolic function, no evidence of focal wall motion normalities, and no evidence of significant valvular pathology.  In the setting of a single prior episode of atrial fibrillation, the patient reports that he follows with Dr. Mayford Knife as his outpatient cardiologist.     ED Course:  Vital signs in the ED were notable for the following: Temperature max 99; heart rate 10 9-1 16; blood pressure 146/95; respiratory rate 90-94; oxygen saturation 92 to 94% on room air.  Labs were notable for the following: BMP was notable for the following: Sodium 142, potassium 3.4, BUN 21, creatinine 1.52 relative to most recent prior value of 1.36 on 03/19/2020.  BNP 13.4.  High-sensitivity troponin high initially noted to be 28, with repeat value trending down to 24.  This is relative to most recent prior high-sensitivity troponin I value of 10 in December 2020.  CBC was notable for the following: White blood cell count of 4800, hemoglobin 13.  D-dimer 1.55.  Nasopharyngeal COVID-19/influenza PCR was checked in the ED this evening, with result currently pending.  Chest x-ray showed no evidence of acute cardiopulmonary process, including no evidence of infiltrate, edema, effusion, or pneumothorax.  CTA of the chest was associated with a suboptimal study, although it showed questionable filling defects within the segmental and subsegmental branches of the right upper lobe.  Otherwise, CTA chest showed no evidence of acute cardiopulmonary process, including no evidence of infiltrate or edema.   Presenting EKG showed sinus tachycardia with PAC and heart rate 119, right bundle branch block with QRS 148 ms and QTc 500.  EKG also showed nonspecific T wave inversion in V1 as well as nonspecific less than 1 mm ST depression in V2 and V3, with ST depression in V3 similar to  that noted in most recent prior EKG from 03/19/2020.  Today's EKG showed no evidence of ST elevation.   While in the ED, the following were administered: Heparin bolus followed by initiation of heparin drip.   Hospital Course   Pulmonary embolism -Is most likely in the setting of COVID-19 infection, and sedentary lifestyle recently, CTA chest with evidence of segmental and subsegmental filling defect in the right upper lobe, D-dimers elevated at 1.55, patient with COVID-19 infection, certainly at high risk of hypercoagulable state, decision was made to proceed with anticoagulation, he was treated with heparin drip during hospital stay, he will be transitioned to Eliquis on discharge. -Venous Dopplers with no evidence of DVT.  Hypokalemia -Repleted   Hypertension -Continue with home medications  Hyperlipidemia -Continue with home medications   Paroxysmal A. fib - rate controlled, he is started on  Eliquis during hospital stay for acute PE  COVID-19 infection -He is unvaccinated, he is asymptomatic, symptoms within 10 days, does not require any COVID-19 treatment, he is admitted for pulmonary embolism, and not COVID-19, he does qualify for monoclonal antibody, received monoclonal antibody treatment on 12/6, I have discussed with the patient at length, he is early in his COVID-19 disease, and he be early in his Covid 70 infection to have any symptoms, he was instructed to come back to ED for any dyspnea or shortness of breath,.     Discharge Condition:  stable   Follow UP   Follow-up Information    Ronnald Nian, MD Follow up in 10 day(s).   Specialty: Family Medicine Contact information: [address] [name] [address] [phone]        Quintella Reichert, MD Follow up.   Specialty: Cardiology Contact information: [address] [name] [address] [phone]                 Discharge Instructions  and  Discharge  Medications     Discharge Instructions    Diet - low sodium heart healthy   Complete by: As directed    Discharge instructions   Complete by: As directed    Follow with Primary MD Ronnald Nian, MD in 10 days   Get CBC, CMP, 2 view Chest X ray checked  by Primary MD next visit.    Activity: As tolerated with Full fall precautions use walker/cane & assistance as needed   Disposition Home    Diet: Heart Healthy , with feeding assistance and aspiration precautions.  On your next visit with your primary care physician please Get Medicines reviewed and adjusted.   Please request your Prim.MD to go over all Hospital Tests and Procedure/Radiological results at the follow up, please get all Hospital records sent to your Prim MD by signing hospital release before you go home.   If you experience worsening of your admission symptoms, develop shortness of breath, life threatening emergency, suicidal or homicidal thoughts you must seek medical attention immediately by calling 911 or calling your MD immediately  if symptoms less severe.  You Must read complete instructions/literature along with all the possible adverse reactions/side effects for all the Medicines you take and that have been prescribed to you. Take any new Medicines after you have completely understood and accpet all the possible adverse reactions/side effects.   Do not drive, operating heavy machinery, perform activities at heights, swimming or participation in water activities or provide baby sitting services if your were admitted for syncope or siezures until you have seen by Primary MD or a Neurologist and advised to do so again.  Do not drive when taking Pain medications.    Do not take more than prescribed Pain, Sleep and Anxiety Medications  Special Instructions: If you have smoked or chewed Tobacco  in the last 2 yrs please stop smoking, stop any regular Alcohol  and or any Recreational drug use.  Wear Seat belts  while driving.   Please note  You were cared for by a hospitalist during your hospital stay. If you have any questions about your discharge medications or the care you received while you were in the hospital after you are discharged, you can call the unit and asked to speak with the hospitalist on call if the hospitalist that took care of you is not available. Once you are discharged, your primary care physician will handle any  further medical issues. Please note that NO REFILLS for any discharge medications will be authorized once you are discharged, as it is imperative that you return to your primary care physician (or establish a relationship with a primary care physician if you do not have one) for your aftercare needs so that they can reassess your need for medications and monitor your lab values.   Increase activity slowly   Complete by: As directed    MyChart COVID-19 home monitoring program   Complete by: May 28, 2020    Is the patient willing to use the MyChart Mobile App for home monitoring?: Yes   Temperature monitoring   Complete by: May 28, 2020    After how many days would you like to receive a notification of this patient's flowsheet entries?: 1     Allergies as of 05/28/2020      Reactions   Garlic Swelling   Shrimp [shellfish Allergy] Swelling      Medication List    STOP taking these medications   aspirin 81 MG tablet     TAKE these medications   acetaminophen 500 MG tablet Commonly known as: TYLENOL Take 500 mg by mouth every 6 (six) hours as needed for mild pain or headache.   Apixaban Starter Pack (10mg  and 5mg ) Commonly known as: ELIQUIS STARTER PACK Take as directed on package: start with two-5mg  tablets twice daily for 7 days. On day 8, switch to one-5mg  tablet twice daily.   carvedilol 25 MG tablet Commonly known as: COREG Take 1 tablet (25 mg total) by mouth 2 (two) times daily.   chlorthalidone 25 MG tablet Commonly known as: HYGROTON Take 1 tablet  (25 mg total) by mouth daily.   CLEAR EYES FOR DRY EYES OP Apply 1 drop to eye daily as needed (for dry eyes).   diltiazem 360 MG 24 hr capsule Commonly known as: Cardizem CD Take 1 capsule (360 mg total) by mouth daily.   losartan 100 MG tablet Commonly known as: COZAAR Take 1 tablet (100 mg total) by mouth daily.   multivitamin tablet Take 1 tablet by mouth daily.   pantoprazole 40 MG tablet Commonly known as: Protonix Take 1 tablet (40 mg total) by mouth daily.   pravastatin 40 MG tablet Commonly known as: PRAVACHOL Take 1 tablet by mouth once daily         Diet and Activity recommendation: See Discharge Instructions above   Consults obtained - none   Major procedures and Radiology Reports - PLEASE review detailed and final reports for all details, in brief -      DG Chest 2 View  Result Date: 05/26/2020 CLINICAL DATA:  Shortness of breath EXAM: CHEST - 2 VIEW COMPARISON:  None. FINDINGS: The heart size and mediastinal contours are within normal limits. Both lungs are clear. The visualized skeletal structures are unremarkable. IMPRESSION: No active cardiopulmonary disease. Electronically Signed   By: Jonna Clark M.D.   On: 05/26/2020 23:48   CT ANGIO CHEST PE W OR WO CONTRAST  Result Date: 05/27/2020 CLINICAL DATA:  PE suspected increasing shortness of breath. Intermittent chest pain. EXAM: CT ANGIOGRAPHY CHEST WITH CONTRAST TECHNIQUE: Multidetector CT imaging of the chest was performed using the standard protocol during bolus administration of intravenous contrast. Multiplanar CT image reconstructions and MIPs were obtained to evaluate the vascular anatomy. CONTRAST:  75mL OMNIPAQUE IOHEXOL 350 MG/ML SOLN COMPARISON:  None. FINDINGS: Cardiovascular: Evaluation is limited by suboptimal contrast bolus timing in addition to streak artifact from  the patient's left arm. There are questionable filling defects within the segmental and subsegmental branches of the right  upper lobe (axial series 7, image 109). The size of the main pulmonary artery is normal. Heart size is mildly enlarged. There are coronary artery calcifications. Minimal atherosclerotic changes are noted of the thoracic aorta without evidence for an aneurysm or dissection. There is no significant pericardial effusion. Mediastinum/Nodes: -- No mediastinal lymphadenopathy. -- No hilar lymphadenopathy. -- No axillary lymphadenopathy. -- No supraclavicular lymphadenopathy. -- Normal thyroid gland where visualized. -  Unremarkable esophagus. Lungs/Pleura: There are few streaky airspace opacities in the right lower lobe that are favored to represent atelectasis. The lung volumes are low. There is no pneumothorax. No significant pleural effusion. Upper Abdomen: Contrast bolus timing is not optimized for evaluation of the abdominal organs. The visualized portions of the organs of the upper abdomen are normal. Musculoskeletal: No chest wall abnormality. No bony spinal canal stenosis. Review of the MIP images confirms the above findings. IMPRESSION: 1. Evaluation is limited by suboptimal contrast bolus timing in addition to streak artifact from the patient's left arm. Given this limitation, there are questionable filling defects within the segmental and subsegmental branches of the right upper lobe. Pulmonary embolism is not excluded. Correlation with D-dimer is recommended. 2. Low lung volumes with streaky airspace opacities in the right lower lobe that are favored to represent atelectasis. Aortic Atherosclerosis (ICD10-I70.0). Electronically Signed   By: Katherine Mantle M.D.   On: 05/27/2020 03:27   ECHOCARDIOGRAM COMPLETE  Result Date: 05/27/2020    ECHOCARDIOGRAM REPORT   Patient Name:   JAVONNI MACKE Date of Exam: 05/27/2020 Medical Rec #:  [id number]         Height:       72.0 in Accession #:    [id number]        Weight:       265.0 lb Date of Birth:  [birth year]          BSA:          2.401 m Patient Age:    59  years          BP:           132/89 mmHg Patient Gender: M                 HR:           87 bpm. Exam Location:  Inpatient Procedure: 2D Echo, Cardiac Doppler and Color Doppler Indications:    R07.9* Chest pain, unspecified  History:        Patient has prior history of Echocardiogram examinations, most                 recent 05/31/2019. Cardiomegaly, Arrythmias:Atrial Fibrillation;                 Risk Factors:Hypertension, Dyslipidemia and Sleep Apnea.                 COVID-19 Positive. Hypothyroidism.  Sonographer:    Tiffany Dance Referring Phys: [id number] Angie Fava IMPRESSIONS  1. Left ventricular ejection fraction, by estimation, is 55 to 60%. The left ventricle has normal function. The left ventricle has no regional wall motion abnormalities. Left ventricular diastolic parameters are consistent with Grade I diastolic dysfunction (impaired relaxation).  2. Right ventricular systolic function is normal. The right ventricular size is normal. Tricuspid regurgitation signal is inadequate for assessing PA pressure.  3. The mitral valve is grossly normal. No evidence of mitral  valve regurgitation. No evidence of mitral stenosis.  4. Focal calcification noted on the RCC or the AoV. The aortic valve is tricuspid. There is mild calcification of the aortic valve. Aortic valve regurgitation is not visualized. Mild aortic valve sclerosis is present, with no evidence of aortic valve stenosis.  5. The inferior vena cava is normal in size with greater than 50% respiratory variability, suggesting right atrial pressure of 3 mmHg. Comparison(s): No significant change from prior study. FINDINGS  Left Ventricle: Left ventricular ejection fraction, by estimation, is 55 to 60%. The left ventricle has normal function. The left ventricle has no regional wall motion abnormalities. The left ventricular internal cavity size was normal in size. There is  no left ventricular hypertrophy. Left ventricular diastolic parameters are  consistent with Grade I diastolic dysfunction (impaired relaxation). Right Ventricle: The right ventricular size is normal. No increase in right ventricular wall thickness. Right ventricular systolic function is normal. Tricuspid regurgitation signal is inadequate for assessing PA pressure. Left Atrium: Left atrial size was normal in size. Right Atrium: Right atrial size was normal in size. Pericardium: Trivial pericardial effusion is present. Presence of pericardial fat pad. Mitral Valve: The mitral valve is grossly normal. No evidence of mitral valve regurgitation. No evidence of mitral valve stenosis. Tricuspid Valve: The tricuspid valve is grossly normal. Tricuspid valve regurgitation is not demonstrated. No evidence of tricuspid stenosis. Aortic Valve: Focal calcification noted on the RCC or the AoV. The aortic valve is tricuspid. There is mild calcification of the aortic valve. Aortic valve regurgitation is not visualized. Mild aortic valve sclerosis is present, with no evidence of aortic valve stenosis. Pulmonic Valve: The pulmonic valve was grossly normal. Pulmonic valve regurgitation is not visualized. No evidence of pulmonic stenosis. Aorta: The aortic root and ascending aorta are structurally normal, with no evidence of dilitation. Venous: The inferior vena cava is normal in size with greater than 50% respiratory variability, suggesting right atrial pressure of 3 mmHg. IAS/Shunts: The atrial septum is grossly normal.  LEFT VENTRICLE PLAX 2D LVIDd:         4.90 cm  Diastology LVIDs:         3.40 cm  LV e' medial:    5.22 cm/s LV PW:         1.10 cm  LV E/e' medial:  8.5 LV IVS:        1.40 cm  LV e' lateral:   8.16 cm/s LVOT diam:     2.70 cm  LV E/e' lateral: 5.5 LV SV:         49 LV SV Index:   20 LVOT Area:     5.73 cm  RIGHT VENTRICLE             IVC RV Basal diam:  2.90 cm     IVC diam: 2.10 cm RV S prime:     11.40 cm/s TAPSE (M-mode): 1.7 cm LEFT ATRIUM             Index       RIGHT ATRIUM            Index LA diam:        3.00 cm 1.25 cm/m  RA Area:     18.50 cm LA Vol (A2C):   53.8 ml 22.41 ml/m RA Volume:   51.90 ml  21.61 ml/m LA Vol (A4C):   19.6 ml 8.16 ml/m LA Biplane Vol: 33.8 ml 14.08 ml/m  AORTIC VALVE LVOT Vmax:   53.15 cm/s  LVOT Vmean:  36.800 cm/s LVOT VTI:    0.085 m  AORTA Ao Root diam: 3.80 cm Ao Asc diam:  3.70 cm MITRAL VALVE MV Area (PHT): 1.93 cm    SHUNTS MV Decel Time: 394 msec    Systemic VTI:  0.08 m MV E velocity: 44.60 cm/s  Systemic Diam: 2.70 cm MV A velocity: 38.10 cm/s MV E/A ratio:  1.17 Lennie Odor MD Electronically signed by Lennie Odor MD Signature Date/Time: 05/27/2020/3:08:51 PM    Final    VAS [place] LOWER EXTREMITY VENOUS (DVT)  Result Date: 05/27/2020  Lower Venous DVT Study Indications: Pulmonary embolism, Covid-19, and SOB.  Limitations: Body habitus. Comparison Study: Prior negative RLE venous duplex from 03/30/2015 is available Performing Technologist: Sherren Kerns RVS  Examination Guidelines: A complete evaluation includes B-mode imaging, spectral Doppler, color Doppler, and power Doppler as needed of all accessible portions of each vessel. Bilateral testing is considered an integral part of a complete examination. Limited examinations for reoccurring indications may be performed as noted. The reflux portion of the exam is performed with the patient in reverse Trendelenburg.  +---------+---------------+---------+-----------+----------+--------------+ RIGHT    CompressibilityPhasicitySpontaneityPropertiesThrombus Aging +---------+---------------+---------+-----------+----------+--------------+ CFV      Full           Yes      Yes                                 +---------+---------------+---------+-----------+----------+--------------+ SFJ      Full                                                        +---------+---------------+---------+-----------+----------+--------------+ FV Prox  Full                                                         +---------+---------------+---------+-----------+----------+--------------+ FV Mid   Full                                                        +---------+---------------+---------+-----------+----------+--------------+ FV DistalFull                                                        +---------+---------------+---------+-----------+----------+--------------+ PFV      Full                                                        +---------+---------------+---------+-----------+----------+--------------+ POP      Full           Yes      Yes                                 +---------+---------------+---------+-----------+----------+--------------+  PTV      Full                                                        +---------+---------------+---------+-----------+----------+--------------+ PERO     Full                                                        +---------+---------------+---------+-----------+----------+--------------+   +---------+---------------+---------+-----------+----------+--------------+ LEFT     CompressibilityPhasicitySpontaneityPropertiesThrombus Aging +---------+---------------+---------+-----------+----------+--------------+ CFV      Full           Yes      Yes                                 +---------+---------------+---------+-----------+----------+--------------+ SFJ      Full                                                        +---------+---------------+---------+-----------+----------+--------------+ FV Prox  Full                                                        +---------+---------------+---------+-----------+----------+--------------+ FV Mid   Full                                                        +---------+---------------+---------+-----------+----------+--------------+ FV DistalFull                                                         +---------+---------------+---------+-----------+----------+--------------+ PFV      Full                                                        +---------+---------------+---------+-----------+----------+--------------+ POP      Full           Yes      Yes                                 +---------+---------------+---------+-----------+----------+--------------+ PTV      Full                                                        +---------+---------------+---------+-----------+----------+--------------+  PERO     Full                                                        +---------+---------------+---------+-----------+----------+--------------+     Summary: BILATERAL: - No evidence of deep vein thrombosis seen in the lower extremities, bilaterally. -   *See table(s) above for measurements and observations. Electronically signed by Fabienne Brunsharles Fields MD on 05/27/2020 at 5:02:22 PM.    Final     Micro Results     Recent Results (from the past 240 hour(s))  Resp Panel by RT-PCR (Flu A&B, Covid) Nasopharyngeal Swab     Status: Abnormal   Collection Time: 05/27/20  4:25 AM   Specimen: Nasopharyngeal Swab; Nasopharyngeal(NP) swabs in vial transport medium  Result Value Ref Range Status   SARS Coronavirus 2 by RT PCR POSITIVE (A) NEGATIVE Final    Comment: RESULT CALLED TO, READ BACK BY AND VERIFIED WITH: J. DODD,RN [id number] 05/27/2020 T. TYSOR (NOTE) SARS-CoV-2 target nucleic acids are DETECTED.  The SARS-CoV-2 RNA is generally detectable in upper respiratory specimens during the acute phase of infection. Positive results are indicative of the presence of the identified virus, but do not rule out bacterial infection or co-infection with other pathogens not detected by the test. Clinical correlation with patient history and other diagnostic information is necessary to determine patient infection status. The expected result is Negative.  Fact Sheet for Patients:  BloggerCourse.comhttps://www.fda.gov/media/152166/download  Fact Sheet for Healthcare Providers: SeriousBroker.ithttps://www.fda.gov/media/152162/download  This test is not yet approved or cleared by the [place] FDA and  has been authorized for detection and/or diagnosis of SARS-CoV-2 by FDA under an Emergency Use Authorization (EUA).  This EUA will remain in effect (meaning this test can  be used) for the duration of  the COVID-19 declaration under Section 564(b)(1) of the Act, 21 U.S.C. section 360bbb-3(b)(1), unless the authorization is terminated or revoked sooner.     Influenza A by PCR NEGATIVE NEGATIVE Final   Influenza B by PCR NEGATIVE NEGATIVE Final    Comment: (NOTE) The Xpert Xpress SARS-CoV-2/FLU/RSV plus assay is intended as an aid in the diagnosis of influenza from Nasopharyngeal swab specimens and should not be used as a sole basis for treatment. Nasal washings and aspirates are unacceptable for Xpert Xpress SARS-CoV-2/FLU/RSV testing.  Fact Sheet for Patients: BloggerCourse.comhttps://www.fda.gov/media/152166/download  Fact Sheet for Healthcare Providers: SeriousBroker.ithttps://www.fda.gov/media/152162/download  This test is not yet approved or cleared by the [place] FDA and has been authorized for detection and/or diagnosis of SARS-CoV-2 by FDA under an Emergency Use Authorization (EUA). This EUA will remain in effect (meaning this test can be used) for the duration of the COVID-19 declaration under Section 564(b)(1) of the Act, 21 U.S.C. section 360bbb-3(b)(1), unless the authorization is terminated or revoked.  Performed at [facility], [address] [place], [place] [id number]        Today   Subjective:   Martin [name] today has no headache,no chest abdominal pain,no new weakness tingling or numbness, feels much better wants to go home today. He ambulated in the hallway 94% on room air with no hypoxia or dyspnea.  Objective:   Blood pressure 131/88, pulse 83, temperature 98.4 F  (36.9 C), temperature source Oral, resp. rate (!) 25, height 6' (1.829 m), weight 120.2 kg, SpO2 95 %.  Intake/Output Summary (Last 24 hours) at 05/28/2020 1057 Last data filed at 05/28/2020 0946 Gross per 24 hour  Intake 410.23 ml  Output -  Net 410.23 ml    Exam  Awake Alert, Oriented x 3, No new F.N deficits, Normal affect  Symmetrical Chest wall movement, Good air movement bilaterally, CTAB RRR,No Gallops,Rubs or new Murmurs, No Parasternal Heave +ve B.Sounds, Abd Soft, Non tender,No rebound -guarding or rigidity. No Cyanosis, Clubbing or edema, No new Rash or bruise  Data Review   CBC w Diff:  Lab Results  Component Value Date   WBC 3.6 (L) 05/28/2020   HGB 11.7 (L) 05/28/2020   HGB 11.6 (L) 06/14/2019   HCT 35.9 (L) 05/28/2020   HCT 36.8 (L) 06/14/2019   PLT 93 (L) 05/28/2020   PLT 411 06/14/2019   LYMPHOPCT 16 06/01/2019   MONOPCT 9 06/01/2019   EOSPCT 1 06/01/2019   BASOPCT 1 06/01/2019    CMP:  Lab Results  Component Value Date   NA 141 05/27/2020   NA 141 03/19/2020   K 3.7 05/27/2020   CL 103 05/27/2020   CO2 27 05/27/2020   BUN 21 05/27/2020   BUN 20 03/19/2020   CREATININE 1.53 (H) 05/27/2020   CREATININE 1.18 04/03/2016   PROT 7.0 06/14/2019   ALBUMIN 4.0 06/14/2019   BILITOT 0.6 06/14/2019   ALKPHOS 83 06/14/2019   AST 13 06/14/2019   ALT 34 06/14/2019  .   Total Time in preparing paper work, data evaluation and todays exam - 35 minutes  Huey Bienenstock M.D on 05/28/2020 at 10:57 AM  [facility]   Office  [phone]

## 2020-05-28 NOTE — Discharge Instructions (Addendum)
Information on my medicine - ELIQUIS (apixaban)  This medication education was reviewed with me or my healthcare representative as part of my discharge preparation.    Why was Eliquis prescribed for you? Eliquis was prescribed to treat blood clots that may have been found in the veins of your legs (deep vein thrombosis) or in your lungs (pulmonary embolism) and to reduce the risk of them occurring again.  What do You need to know about Eliquis ? The starting dose is 10 mg (two 5 mg tablets) taken TWICE daily for the FIRST SEVEN (7) DAYS, then on 06/04/20  the dose is reduced to ONE 5 mg tablet taken TWICE daily.  Eliquis may be taken with or without food.   Try to take the dose about the same time in the morning and in the evening. If you have difficulty swallowing the tablet whole please discuss with your pharmacist how to take the medication safely.  Take Eliquis exactly as prescribed and DO NOT stop taking Eliquis without talking to the doctor who prescribed the medication.  Stopping may increase your risk of developing a new blood clot.  Refill your prescription before you run out.  After discharge, you should have regular check-up appointments with your healthcare provider that is prescribing your Eliquis.    What do you do if you miss a dose? If a dose of ELIQUIS is not taken at the scheduled time, take it as soon as possible on the same day and twice-daily administration should be resumed. The dose should not be doubled to make up for a missed dose.  Important Safety Information A possible side effect of Eliquis is bleeding. You should call your healthcare provider right away if you experience any of the following: ? Bleeding from an injury or your nose that does not stop. ? Unusual colored urine (red or dark brown) or unusual colored stools (red or black). ? Unusual bruising for unknown reasons. ? A serious fall or if you hit your head (even if there is no bleeding).  Some  medicines may interact with Eliquis and might increase your risk of bleeding or clotting while on Eliquis. To help avoid this, consult your healthcare provider or pharmacist prior to using any new prescription or non-prescription medications, including herbals, vitamins, non-steroidal anti-inflammatory drugs (NSAIDs) and supplements.  This website has more information on Eliquis (apixaban): http://www.eliquis.com/eliquis/home       Person Under Monitoring Name: [facility]  Location: [address] [place] [address]   Infection Prevention Recommendations for Individuals Confirmed to have, or Being Evaluated for, 2019 Novel Coronavirus (COVID-19) Infection Who Receive Care at Home  Individuals who are confirmed to have, or are being evaluated for, COVID-19 should follow the prevention steps below until a healthcare provider or local or state health department says they can return to normal activities.  Stay home except to get medical care You should restrict activities outside your home, except for getting medical care. Do not go to work, school, or public areas, and do not use public transportation or taxis.  Call ahead before visiting your doctor Before your medical appointment, call the healthcare provider and tell them that you have, or are being evaluated for, COVID-19 infection. This will help the healthcare provider's office take steps to keep other people from getting infected. Ask your healthcare provider to call the local or state health department.  Monitor your symptoms Seek prompt medical attention if your illness is worsening (e.g., difficulty breathing). Before going to your  medical appointment, call the healthcare provider and tell them that you have, or are being evaluated for, COVID-19 infection. Ask your healthcare provider to call the local or state health department.  Wear a facemask You should wear a facemask that covers your nose and mouth when you  are in the same room with other people and when you visit a healthcare provider. People who live with or visit you should also wear a facemask while they are in the same room with you.  Separate yourself from other people in your home As much as possible, you should stay in a different room from other people in your home. Also, you should use a separate bathroom, if available.  Avoid sharing household items You should not share dishes, drinking glasses, cups, eating utensils, towels, bedding, or other items with other people in your home. After using these items, you should wash them thoroughly with soap and water.  Cover your coughs and sneezes Cover your mouth and nose with a tissue when you cough or sneeze, or you can cough or sneeze into your sleeve. Throw used tissues in a lined trash can, and immediately wash your hands with soap and water for at least 20 seconds or use an alcohol-based hand rub.  Wash your [employer] your hands often and thoroughly with soap and water for at least 20 seconds. You can use an alcohol-based hand sanitizer if soap and water are not available and if your hands are not visibly dirty. Avoid touching your eyes, nose, and mouth with unwashed hands.   Prevention Steps for Caregivers and Household Members of Individuals Confirmed to have, or Being Evaluated for, COVID-19 Infection Being Cared for in the Home  If you live with, or provide care at home for, a person confirmed to have, or being evaluated for, COVID-19 infection please follow these guidelines to prevent infection:  Follow healthcare provider's instructions Make sure that you understand and can help the patient follow any healthcare provider instructions for all care.  Provide for the patient's basic needs You should help the patient with basic needs in the home and provide support for getting groceries, prescriptions, and other personal needs.  Monitor the patient's symptoms If they are  getting sicker, call his or her medical provider and tell them that the patient has, or is being evaluated for, COVID-19 infection. This will help the healthcare provider's office take steps to keep other people from getting infected. Ask the healthcare provider to call the local or state health department.  Limit the number of people who have contact with the patient  If possible, have only one caregiver for the patient.  Other household members should stay in another home or place of residence. If this is not possible, they should stay  in another room, or be separated from the patient as much as possible. Use a separate bathroom, if available.  Restrict visitors who do not have an essential need to be in the home.  Keep older adults, very young children, and other sick people away from the patient Keep older adults, very young children, and those who have compromised immune systems or chronic health conditions away from the patient. This includes people with chronic heart, lung, or kidney conditions, diabetes, and cancer.  Ensure good ventilation Make sure that shared spaces in the home have good air flow, such as from an air conditioner or an opened window, weather permitting.  Wash your hands often  Wash your hands often and thoroughly with  soap and water for at least 20 seconds. You can use an alcohol based hand sanitizer if soap and water are not available and if your hands are not visibly dirty.  Avoid touching your eyes, nose, and mouth with unwashed hands.  Use disposable paper towels to dry your hands. If not available, use dedicated cloth towels and replace them when they become wet.  Wear a facemask and gloves  Wear a disposable facemask at all times in the room and gloves when you touch or have contact with the patient's blood, body fluids, and/or secretions or excretions, such as sweat, saliva, sputum, nasal mucus, vomit, urine, or feces.  Ensure the mask fits over your  nose and mouth tightly, and do not touch it during use.  Throw out disposable facemasks and gloves after using them. Do not reuse.  Wash your hands immediately after removing your facemask and gloves.  If your personal clothing becomes contaminated, carefully remove clothing and launder. Wash your hands after handling contaminated clothing.  Place all used disposable facemasks, gloves, and other waste in a lined container before disposing them with other household waste.  Remove gloves and wash your hands immediately after handling these items.  Do not share dishes, glasses, or other household items with the patient  Avoid sharing household items. You should not share dishes, drinking glasses, cups, eating utensils, towels, bedding, or other items with a patient who is confirmed to have, or being evaluated for, COVID-19 infection.  After the person uses these items, you should wash them thoroughly with soap and water.  Wash laundry thoroughly  Immediately remove and wash clothes or bedding that have blood, body fluids, and/or secretions or excretions, such as sweat, saliva, sputum, nasal mucus, vomit, urine, or feces, on them.  Wear gloves when handling laundry from the patient.  Read and follow directions on labels of laundry or clothing items and detergent. In general, wash and dry with the warmest temperatures recommended on the label.  Clean all areas the individual has used often  Clean all touchable surfaces, such as counters, tabletops, doorknobs, bathroom fixtures, toilets, phones, keyboards, tablets, and bedside tables, every day. Also, clean any surfaces that may have blood, body fluids, and/or secretions or excretions on them.  Wear gloves when cleaning surfaces the patient has come in contact with.  Use a diluted bleach solution (e.g., dilute bleach with 1 part bleach and 10 parts water) or a household disinfectant with a label that says EPA-registered for coronaviruses.  To make a bleach solution at home, add 1 tablespoon of bleach to 1 quart (4 cups) of water. For a larger supply, add  cup of bleach to 1 gallon (16 cups) of water.  Read labels of cleaning products and follow recommendations provided on product labels. Labels contain instructions for safe and effective use of the cleaning product including precautions you should take when applying the product, such as wearing gloves or eye protection and making sure you have good ventilation during use of the product.  Remove gloves and wash hands immediately after cleaning.  Monitor yourself for signs and symptoms of illness Caregivers and household members are considered close contacts, should monitor their health, and will be asked to limit movement outside of the home to the extent possible. Follow the monitoring steps for close contacts listed on the symptom monitoring form.   ? If you have additional questions, contact your local health department or call the epidemiologist on call at [phone] (available 24/7). ? This guidance  is subject to change. For the most up-to-date guidance from [facility], please refer to their website: [email or web address]

## 2020-05-28 NOTE — Discharge Planning (Signed)
Medication/Dose: ELIQUIS  2.5 MG BID   AND ELIQUIS  5 MG BID  Covered?: Yes  Tier: (NO TIER)  Prescription Coverage [facility]  Spoke with Person/Company/Phone Number:: KEVIN @ [facility] [employer] # [phone] OPT- MEMBER  Co-Pay: $ 47.00   FOR EACH PRESCIPTION  Prior Approval: No  Deductible: Met  Additional Notes: 90 DAY SUPPLY FOR M/O OR RETAIL  $  141.00

## 2020-05-28 NOTE — ED Notes (Signed)
Pt ambulatory in room; sats fluctuated between 94 and 99% on RA

## 2020-05-28 NOTE — Progress Notes (Addendum)
ANTICOAGULATION CONSULT NOTE  Pharmacy Consult for Heparin >> Apixaban (see addendum) Indication: pulmonary embolus  Patient Measurements: Height: 6' (182.9 cm) Weight: 120.2 kg (265 lb) IBW/kg (Calculated) : 77.6 Heparin Dosing Weight: 105 kg  Vital Signs: Temp: 98.4 F (36.9 C) (12/07 0600) Temp Source: Oral (12/07 0600) BP: 155/105 (12/07 0700) Pulse Rate: 82 (12/07 0700)  Labs: Recent Labs    05/26/20 2336 05/26/20 2336 05/27/20 0200 05/27/20 0714 05/27/20 1001 05/27/20 1610 05/27/20 2215 05/28/20 0412  HGB 13.0   < >  --  12.1*  --   --   --  11.7*  HCT 40.7  --   --  38.4*  --   --   --  35.9*  PLT 128*  --   --  105*  --   --   --  93*  HEPARINUNFRC  --   --   --   --    < > 0.33 0.54 0.35  CREATININE 1.52*  --   --  1.53*  --   --   --   --   TROPONINIHS 28*  --  24* 25*  --   --   --   --    < > = values in this interval not displayed.    Estimated Creatinine Clearance: 67.8 mL/min (A) (by C-G formula based on SCr of 1.53 mg/dL (H)).   Medical History: Past Medical History:  Diagnosis Date  . Cardiomegaly    LVH  . Cataract   . Hemorrhoids   . Hyperlipidemia   . Hypertension   . Hypothyroidism   . Lower GI bleeding   . OSA (obstructive sleep apnea)    mild OSA with an AHI of 8.1/hr but severe during REM sleep with AHI 43/hr.  He had O2 desats as low as 85%.  [name] PAF (paroxysmal atrial fibrillation) (HCC)    in setting of acute cholecystitis.  No anticoagulation due to setting of acute illness and CHADS2VAS score of 1    Assessment: 59 y.o. male with history of afib not on anticoagulation PTA presented COVID-19+. D-dimer 1.55, CTA could not exclude PE. LE dopplers negative for DVT. Pharmacy was consulted to dose heparin for PE.   Heparin level this morning remains therapeutic (HL 0.35 << 0.54, goal of 0.3-0.7). Hgb/Hct slight drop, plts down to 93 - will monitor closely.   Goal of Therapy:  Heparin level 0.3-0.7 units/ml Monitor platelets by  anticoagulation protocol: Yes   Plan:  Cont heparin infusion to 1750 units/hr Monitor daily heparin level, CBC Monitor for signs/symptoms of bleeding F/U transition to oral anticoagulant when able  Addendum The plan is now to transition the patient to Apixaban. Given acute PE - he will need higher dosing for the first week before starting on his maintenance dosing.   Education performed with the patient, desires to fill at [facility] prior to discharge - planned for today.  Plan - D/c Heparin  - Start Apixaban 10 mg bid x7d followed by 5 mg bid - Education done, AVS in chart  Thank you for allowing pharmacy to be a part of this patient's care.  Georgina Pillion, PharmD, BCPS Clinical Pharmacist Clinical phone for 05/28/2020: [phone] 05/28/2020 8:08 AM   **Pharmacist phone directory can now be found on [email or web address] (PW [id number]).  Listed under [facility].

## 2020-05-30 ENCOUNTER — Telehealth: Payer: Self-pay

## 2020-05-30 NOTE — Telephone Encounter (Signed)
Pt. Scheduled for a f/u on 06/11/20 and medications were gone over and reconciled.

## 2020-05-30 NOTE — Telephone Encounter (Signed)
Needs to be seen here in the office 10 days post Covid exposure

## 2020-05-30 NOTE — Telephone Encounter (Signed)
Pt. Is on my TOC report was in the hospital for acute pulmonary embolism and positive for covid. They want him to f/u with cardiology and PCP with in 10 days. Did you want him to f/u here and do want it to be in person or virtual.

## 2020-06-01 ENCOUNTER — Encounter (HOSPITAL_BASED_OUTPATIENT_CLINIC_OR_DEPARTMENT_OTHER): Payer: BC Managed Care – PPO | Admitting: Cardiology

## 2020-06-11 ENCOUNTER — Other Ambulatory Visit: Payer: Self-pay

## 2020-06-11 ENCOUNTER — Encounter: Payer: Self-pay | Admitting: Family Medicine

## 2020-06-11 ENCOUNTER — Ambulatory Visit: Payer: BC Managed Care – PPO | Admitting: Family Medicine

## 2020-06-11 VITALS — BP 138/94 | HR 87 | Temp 98.1°F | Wt 271.8 lb

## 2020-06-11 DIAGNOSIS — I2699 Other pulmonary embolism without acute cor pulmonale: Secondary | ICD-10-CM | POA: Diagnosis not present

## 2020-06-11 DIAGNOSIS — E785 Hyperlipidemia, unspecified: Secondary | ICD-10-CM

## 2020-06-11 DIAGNOSIS — Z23 Encounter for immunization: Secondary | ICD-10-CM

## 2020-06-11 DIAGNOSIS — I10 Essential (primary) hypertension: Secondary | ICD-10-CM

## 2020-06-11 DIAGNOSIS — Z8679 Personal history of other diseases of the circulatory system: Secondary | ICD-10-CM | POA: Diagnosis not present

## 2020-06-11 DIAGNOSIS — Z8616 Personal history of COVID-19: Secondary | ICD-10-CM | POA: Insufficient documentation

## 2020-06-11 NOTE — Progress Notes (Addendum)
   Subjective:    [name], male    DOB: July 22, 1960, 59 y.o.   MRN: [id number]  HPI He is here for a hospital follow-up after recent diagnosis of Covid as well as PE.  He also has a previous history of PAF.  Presently he is on Eliquis.  He does not complain of chest pain, shortness of breath.  He has had some right leg discomfort but at this point it is  not present.  He is taking aspirin as well as pantoprazole but has not had any true reflux related symptoms. He continues on Pravachol, Cardizem, hypertonic, Cozaar and Coreg.   Review of Systems     Objective:   Physical Exam Alert and in no distress.  Cardiac exam shows a regular rhythm.  No murmurs or gallops.  Lungs are clear to auscultation.  Lower extremities show no palpable tenderness.  Negative Homans' sign.       Assessment & Plan:  Acute pulmonary embolism, unspecified pulmonary embolism type, unspecified whether acute cor pulmonale present (HCC)  Essential hypertension - Plan: CBC with Differential/Platelet, Comprehensive metabolic panel  History of atrial fibrillation - Plan: CBC with Differential/Platelet, Comprehensive metabolic panel, Lipid panel  Need for shingles vaccine - Plan: Varicella-zoster vaccine IM (Shingrix)  Hyperlipidemia with target LDL less than 100 - Plan: Lipid panel  Need for COVID-19 vaccine - Plan: Moderna SARS-CoV-2 Vaccine  History of COVID-19  Primary hypertension He will follow up with cardiology concerning his cardiac medications and also to get their input into anticoagulation.  I will keep him on this medication for at least 6 months.  He can stop taking the aspirin as well as the pantoprazole. He is now over 10 days since his Covid diagnosis so was cleared from that.

## 2020-06-11 NOTE — Patient Instructions (Signed)
Stop the aspirin and the pantoprazole.  Keep your appointment with Dr. Mayford Knife.  Get the Covid vaccine in a month

## 2020-06-12 LAB — LIPID PANEL
Chol/HDL Ratio: 3.7 ratio (ref 0.0–5.0)
Cholesterol, Total: 142 mg/dL (ref 100–199)
HDL: 38 mg/dL — ABNORMAL LOW (ref >39)
LDL Chol Calc (NIH): 74 mg/dL (ref 0–99)
Triglycerides: 178 mg/dL — ABNORMAL HIGH (ref 0–149)
VLDL Cholesterol Cal: 30 mg/dL (ref 5–40)

## 2020-06-12 LAB — COMPREHENSIVE METABOLIC PANEL
ALT: 13 IU/L (ref 0–44)
AST: 9 IU/L (ref 0–40)
Albumin/Globulin Ratio: 1.3 (ref 1.2–2.2)
Albumin: 3.8 g/dL (ref 3.8–4.8)
Alkaline Phosphatase: 58 IU/L (ref 44–121)
BUN/Creatinine Ratio: 10 (ref 10–24)
BUN: 13 mg/dL (ref 8–27)
Bilirubin Total: 0.9 mg/dL (ref 0.0–1.2)
CO2: 26 mmol/L (ref 20–29)
Calcium: 9.9 mg/dL (ref 8.6–10.2)
Chloride: 105 mmol/L (ref 96–106)
Creatinine, Ser: 1.28 mg/dL — ABNORMAL HIGH (ref 0.76–1.27)
GFR calc Af Amer: 69 mL/min/{1.73_m2} (ref >59)
GFR calc non Af Amer: 60 mL/min/{1.73_m2} (ref >59)
Globulin, Total: 2.9 g/dL (ref 1.5–4.5)
Glucose: 118 mg/dL — ABNORMAL HIGH (ref 65–99)
Potassium: 4.5 mmol/L (ref 3.5–5.2)
Sodium: 144 mmol/L (ref 134–144)
Total Protein: 6.7 g/dL (ref 6.0–8.5)

## 2020-06-12 LAB — CBC WITH DIFFERENTIAL/PLATELET
Basophils Absolute: 0.1 10*3/uL (ref 0.0–0.2)
Basos: 1 %
EOS (ABSOLUTE): 0.1 10*3/uL (ref 0.0–0.4)
Eos: 1 %
Hematocrit: 34.6 % — ABNORMAL LOW (ref 37.5–51.0)
Hemoglobin: 11 g/dL — ABNORMAL LOW (ref 13.0–17.7)
Immature Grans (Abs): 0 10*3/uL (ref 0.0–0.1)
Immature Granulocytes: 1 %
Lymphocytes Absolute: 1.7 10*3/uL (ref 0.7–3.1)
Lymphs: 24 %
MCH: 31.3 pg (ref 26.6–33.0)
MCHC: 31.8 g/dL (ref 31.5–35.7)
MCV: 98 fL — ABNORMAL HIGH (ref 79–97)
Monocytes Absolute: 0.9 10*3/uL (ref 0.1–0.9)
Monocytes: 12 %
Neutrophils Absolute: 4.5 10*3/uL (ref 1.4–7.0)
Neutrophils: 61 %
Platelets: 465 10*3/uL — ABNORMAL HIGH (ref 150–450)
RBC: 3.52 x10E6/uL — ABNORMAL LOW (ref 4.14–5.80)
RDW: 13 % (ref 11.6–15.4)
WBC: 7.3 10*3/uL (ref 3.4–10.8)

## 2020-06-17 ENCOUNTER — Telehealth: Payer: Self-pay | Admitting: Cardiology

## 2020-06-17 MED ORDER — LOSARTAN POTASSIUM 100 MG PO TABS
100.0000 mg | ORAL_TABLET | Freq: Every day | ORAL | 3 refills | Status: DC
Start: 2020-06-17 — End: 2020-06-24

## 2020-06-17 NOTE — Telephone Encounter (Signed)
Spoke with the patient's wife who states that the patient's pharmacy is out of stock of losartan right now. I have sent in a refill to a new pharmacy per request. They will let me know if they still have difficulty getting it filled.

## 2020-06-17 NOTE — Telephone Encounter (Signed)
Patient is calling stating that his pharmacy is unable to get his losartan (COZAAR) 100 MG tablets due to distribution issues. Patient wants to know if he should get on a different type of this medication. Please advise.

## 2020-06-18 NOTE — Telephone Encounter (Signed)
    Pt's wife calling back, she said she tried going to another [facility] and they also out of stock for losartan, she wanted to speak with a nurse to ask what they need to do next

## 2020-06-18 NOTE — Telephone Encounter (Signed)
Spoke with the patient's pharmacy and they have losartan 50 mg tablets in stock. Rx sent in for the patient to take losartan 50 mg, 2 tablets daily.   Spoke with the patient and made him aware that Rx has been sent in.

## 2020-06-24 ENCOUNTER — Telehealth: Payer: Self-pay

## 2020-06-24 ENCOUNTER — Telehealth: Payer: Self-pay | Admitting: *Deleted

## 2020-06-24 MED ORDER — APIXABAN 5 MG PO TABS: 5.0000 mg | ORAL_TABLET | Freq: Two times a day (BID) | ORAL | 1 refills | Status: DC

## 2020-06-24 MED ORDER — LOSARTAN POTASSIUM 50 MG PO TABS
100.0000 mg | ORAL_TABLET | Freq: Every day | ORAL | 3 refills | Status: DC
Start: 2020-06-24 — End: 2021-06-26

## 2020-06-24 NOTE — Telephone Encounter (Signed)
Our office received a fax today from [facility] Losartan is unavailable in the 100 mg tablet. Request to send in as Losartan 50 mg tablet directions, Losartan 50 mg tablet, take 2 tablets once a day. New Rx has been sent in. I will send an FYI to Dr. Norris Cross nurse Wenda Low.

## 2020-06-24 NOTE — Telephone Encounter (Signed)
Pt states needs refill Eliquis to [facility]

## 2020-06-25 ENCOUNTER — Telehealth: Payer: Self-pay

## 2020-06-25 NOTE — Telephone Encounter (Signed)
P.A. Everlene Balls unable to complete insurance not valid

## 2020-06-26 ENCOUNTER — Telehealth: Payer: Self-pay

## 2020-06-26 NOTE — Telephone Encounter (Signed)
Have him check with the pharmacy on the cost of Xarelto.

## 2020-06-26 NOTE — Telephone Encounter (Signed)
LVM for pt to contact insurance to find out what is covered. KH

## 2020-06-26 NOTE — Telephone Encounter (Signed)
Pt. Called stating that the Eliquis is too expensive its 500 for a 30 day supply and he runs out of it this weekend if he could have something else called in.

## 2020-06-27 ENCOUNTER — Telehealth: Payer: Self-pay

## 2020-06-27 NOTE — Telephone Encounter (Signed)
Pt. Called back stating he checked with his ins. Company about which medication would be covered but they never got back with him about it. He said he is now out of his Eliqius and needs to know what to do now. Can you call in [place] for him to see if that is covered.

## 2020-06-28 ENCOUNTER — Other Ambulatory Visit: Payer: Self-pay | Admitting: Medical

## 2020-06-28 MED ORDER — RIVAROXABAN 15 MG PO TABS: 15.0000 mg | ORAL_TABLET | Freq: Two times a day (BID) | ORAL | 0 refills | Status: DC

## 2020-06-28 NOTE — Telephone Encounter (Signed)
Pt. Aware will pick up medicine at the pharmacy and he will bother ins company about it be covered.

## 2020-06-28 NOTE — Telephone Encounter (Signed)
We have Eliquis, give him some of that and work with him on what his insurance will cover.

## 2020-06-28 NOTE — Telephone Encounter (Signed)
We don't have any eliquis in the back. We don have Xarelto 10,15, and 20mg  tablets we have 3 bottles of 10 mg and 7 bottles of the 15mg  and only one bottle of the 20 mg. Would you like me to give him Xarelto instead. is working on a PA for him on his Eliquis.

## 2020-06-28 NOTE — Telephone Encounter (Signed)
Routing this message to you because JCL is not here. Pt. Was given eliquis at the hospital and is out of it now. We don't have any samples of eliqius and he couldn't get it at his pharmacy was not covered by his ins. We do have samples of Xarelto though.

## 2020-06-28 NOTE — Telephone Encounter (Signed)
I reviewed the chart.  He was diagnosed with pulmonary embolism.  I sent Xarelto as an option or alternative since Eliquis was apparently not covered  You start out 50 mg twice daily for the first 21 days.  After 21 days he would switch over but we would need to send a new prescription and he would need to check in before that date  So to make sure he has a follow-up with Dr. Susann Givens or call back to Dr. Susann Givens before he runs out of Xarelto   Make sure he sees this message today and can get the medication.  Provide samples if needed if we have them  He should also call his insurance and get really firm with him about the serious nature of needing this medicine ASAP

## 2020-07-02 NOTE — Telephone Encounter (Signed)
Pt was switched to Xarelto, called pharmacy went thru for $47 & ready for pick up.  Left message for pt

## 2020-07-03 ENCOUNTER — Encounter: Payer: Self-pay | Admitting: Cardiology

## 2020-07-03 ENCOUNTER — Other Ambulatory Visit: Payer: Self-pay

## 2020-07-03 ENCOUNTER — Ambulatory Visit (INDEPENDENT_AMBULATORY_CARE_PROVIDER_SITE_OTHER): Payer: BC Managed Care – PPO | Admitting: Cardiology

## 2020-07-03 VITALS — BP 124/86 | HR 79 | Ht 73.5 in | Wt 274.0 lb

## 2020-07-03 DIAGNOSIS — G4733 Obstructive sleep apnea (adult) (pediatric): Secondary | ICD-10-CM

## 2020-07-03 DIAGNOSIS — I10 Essential (primary) hypertension: Secondary | ICD-10-CM | POA: Diagnosis not present

## 2020-07-03 DIAGNOSIS — I480 Paroxysmal atrial fibrillation: Secondary | ICD-10-CM | POA: Diagnosis not present

## 2020-07-03 NOTE — Telephone Encounter (Signed)
Order sent back to precert for prior approval.

## 2020-07-03 NOTE — Patient Instructions (Signed)
Medication Instructions:  Your physician recommends that you continue on your current medications as directed. Please refer to the Current Medication list given to you today.  *If you need a refill on your cardiac medications before your next appointment, please call your pharmacy*  Follow-Up: At [facility], you and your health needs are our priority.  As part of our continuing mission to provide you with exceptional heart care, we have created designated Provider Care Teams.  These Care Teams include your primary Cardiologist (physician) and Advanced Practice Providers (APPs -  Physician Assistants and Nurse Practitioners) who all work together to provide you with the care you need, when you need it.  Follow up with Dr. Mayford Knife after sleep study.

## 2020-07-03 NOTE — Progress Notes (Signed)
Cardiology Office Note:    Date:  07/03/2020   ID:  Martin [name], DOB 12-14-1960, MRN [id number]  PCP:  [name], MD  [facility] Cardiologist:  Fransico Him, MD   [facility] Electrophysiologist:  None   Referring MD: [name], MD   Chief Complaint:  Sleep Apnea and Hypertension    Patient Profile:    Martin [name] is a 60 y.o. male with:   Hypertension   Hyperlipidemia   Hypothyroidism   Paroxysmal atrial fibrillation  ? In setting of acute cholecystitis; no anticoagulation - caused by acute illness ? CHA2DS2-VASc=1 (HTN)    OSA, on CPAP  Hx of LGI bleeding   Acute PE in setting of COVID 19 now on Xarelto 1m BID  Prior CV studies: Echocardiogram 05/31/2019 EF 55-60, mild LVH, no RWMA, normal RVSF, mild MAC, trivial MR, trivial TR Echocardiogram 05/27/2020 EF 55-60% with mild AVSC  History of Present Illness:    Martin [name] a 89yoAAM with a hx of PAF in setting of acute illness and not anticoagulated for afib as it was in setting of acute illness with low CHADS2VASC score but now on Xarelto after PE in setting of COVID 19, HTN, OSA on PAP, HLD and hx of GI BLeed.   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   He had been on PCP therapy but could not tolerate his CPAP and his machine was reclaimed by the DME agency.  He was set up to repeat a split night sleep study in mid Dec but had to cancel due [place]  He is here today for followup and is doing well.  He denies any chest pain or pressure, SOB, DOE, PND, orthopnea, LE edema, dizziness, palpitations or syncope. He is compliant with his meds and is tolerating meds with no SE.    Past Medical History:  Diagnosis Date  . Cardiomegaly    LVH  . Cataract   . Hemorrhoids   . Hyperlipidemia   . Hypertension   . Hypothyroidism   . Lower GI bleeding   . OSA  (obstructive sleep apnea)    mild OSA with an AHI of 8.1/hr but severe during REM sleep with AHI 43/hr.  He had O2 desats as low as 85%.  .[name] KitchenPAF (paroxysmal atrial fibrillation) ([facility]    in setting of acute cholecystitis.  No anticoagulation due to setting of acute illness and CHADS2VAS score of 1    Current Medications: Current Meds  Medication Sig  . carvedilol (COREG) 25 MG tablet Take 1 tablet (25 mg total) by mouth 2 (two) times daily.  . chlorthalidone (HYGROTON) 25 MG tablet Take 1 tablet (25 mg total) by mouth daily.  .[name] Kitchendiltiazem (CARDIZEM CD) 360 MG 24 hr capsule Take 1 capsule (360 mg total) by mouth daily.  .[name] Kitchenlosartan (COZAAR) 50 MG tablet Take 2 tablets (100 mg total) by mouth daily.  . pravastatin (PRAVACHOL) 40 MG tablet Take 1 tablet by mouth once daily (Patient taking differently: Take 40 mg by mouth daily.)  . Rivaroxaban (XARELTO) 15 MG TABS tablet Take 1 tablet (15 mg total) by mouth 2 (two) times daily with a meal.     Allergies:   Garlic and Shrimp [shellfish allergy]   Social History   Tobacco Use  . Smoking status: Never Smoker  . Smokeless  tobacco: Never Used  Substance Use Topics  . Alcohol use: Yes    Comment: Rare  . Drug use: No     Family Hx: The patient's family history includes Asthma in his father; Hypertension in his father; Kidney disease in his father and mother.  ROS   EKGs/Labs/Other Test Reviewed:    EKG:  EKG is not ordered today.  The ekg ordered today demonstrates n/a  2D echo 05/27/2020 IMPRESSIONS   1. Left ventricular ejection fraction, by estimation, is 55 to 60%. The  left ventricle has normal function. The left ventricle has no regional  wall motion abnormalities. Left ventricular diastolic parameters are  consistent with Grade I diastolic  dysfunction (impaired relaxation).  2. Right ventricular systolic function is normal. The right ventricular  size is normal. Tricuspid regurgitation signal is inadequate for assessing   PA pressure.  3. The mitral valve is grossly normal. No evidence of mitral valve  regurgitation. No evidence of mitral stenosis.  4. Focal calcification noted on the RCC or the AoV. The aortic valve is  tricuspid. There is mild calcification of the aortic valve. Aortic valve  regurgitation is not visualized. Mild aortic valve sclerosis is present,  with no evidence of aortic valve  stenosis.  5. The inferior vena cava is normal in size with greater than 50%  respiratory variability, suggesting right atrial pressure of 3 mmHg.   Recent Labs: 05/27/2020: B Natriuretic Peptide 13.4; Magnesium 1.7 06/11/2020: ALT 13; BUN 13; Creatinine, Ser 1.28; Hemoglobin 11.0; Platelets 465; Potassium 4.5; Sodium 144   Recent Lipid Panel Lab Results  Component Value Date/Time   CHOL 142 06/11/2020 02:31 PM   TRIG 178 (H) 06/11/2020 02:31 PM   HDL 38 (L) 06/11/2020 02:31 PM   CHOLHDL 3.7 06/11/2020 02:31 PM   CHOLHDL 3.0 04/03/2016 07:54 AM   LDLCALC 74 06/11/2020 02:31 PM      Risk Assessment/Calculations:     CHA2DS2-VASc Score = 1  This indicates a 0.6% annual risk of stroke. The patient's score is based upon: CHF History: No HTN History: Yes Diabetes History: No Stroke History: No Vascular Disease History: No Age Score: 0 Gender Score: 0      Physical Exam:    VS:  BP 124/86   Pulse 79   Ht 6' 1.5" (1.867 m)   Wt 274 lb (124.3 kg)   SpO2 95%   BMI 35.66 kg/m     Wt Readings from Last 3 Encounters:  07/03/20 274 lb (124.3 kg)  06/11/20 271 lb 12.8 oz (123.3 kg)  05/26/20 265 lb (120.2 kg)    GEN: Well nourished, well developed in no acute distress HEENT: Normal NECK: No JVD; No carotid bruits LYMPHATICS: No lymphadenopathy CARDIAC:RRR, no murmurs, rubs, gallops RESPIRATORY:  Clear to auscultation without rales, wheezing or rhonchi  ABDOMEN: Soft, non-tender, non-distended MUSCULOSKELETAL:  No edema; No deformity  SKIN: Warm and dry NEUROLOGIC:  Alert and  oriented x 3 PSYCHIATRIC:  Normal affect      ASSESSMENT & PLAN:    1. Essential hypertension -BP controlled on exam today -continue on Cardizem CD 360m daily, Chlorthalidone 257mdaily, Losartan 10040maily and Carvedilol 94m24mD -SCR stable at 1.28 and K+ 4.5 in Dec 2021  2. OSA  -he was not compliant with his device and had to turn it in -he has a repeat split night sleep study ordered for mid Dec but cancelled due to COVID 19 -will rescheduled sleep study  3.  PAF -he is  maintaining NSR on exam today -continue carvedilol 12m BID and Cardizem CD 3620mdaily -no indication for long term anticoagulation as CHADS2VASC score is 1 and occurred in setting of acute illness with no reoccurrence. He currently is on Xarelto for acute PE complicating COVID 19 and would recommend dosing per PCP but from cardiac standpoint dose not need long term anticoagulation for his PAF.   Dispo:  1 year  Medication Adjustments/Labs and Tests Ordered: Current medicines are reviewed at length with the patient today.  Concerns regarding medicines are outlined above.  Tests Ordered: No orders of the defined types were placed in this encounter.  Medication Changes: No orders of the defined types were placed in this encounter.   Signed, TrFransico HimMD  07/03/2020 1:31 PM    [facility] [address]  [id number]: [phone]: [phone]

## 2020-07-12 ENCOUNTER — Encounter (INDEPENDENT_AMBULATORY_CARE_PROVIDER_SITE_OTHER): Payer: Self-pay

## 2020-07-12 ENCOUNTER — Telehealth: Payer: Self-pay | Admitting: *Deleted

## 2020-07-12 NOTE — Telephone Encounter (Signed)
Staff message sent to Martin [name] per [employer] web portal no PA is required for sleep study. Ok to schedule. 

## 2020-07-16 ENCOUNTER — Ambulatory Visit (INDEPENDENT_AMBULATORY_CARE_PROVIDER_SITE_OTHER): Payer: BC Managed Care – PPO | Admitting: Family Medicine

## 2020-07-16 ENCOUNTER — Ambulatory Visit: Payer: Self-pay

## 2020-07-16 ENCOUNTER — Encounter: Payer: Self-pay | Admitting: Family Medicine

## 2020-07-16 ENCOUNTER — Other Ambulatory Visit: Payer: Self-pay | Admitting: Family Medicine

## 2020-07-16 VITALS — BP 144/98 | HR 79 | Temp 98.7°F | Wt 273.6 lb

## 2020-07-16 DIAGNOSIS — Z86010 Personal history of colonic polyps: Secondary | ICD-10-CM | POA: Insufficient documentation

## 2020-07-16 DIAGNOSIS — Z23 Encounter for immunization: Secondary | ICD-10-CM | POA: Diagnosis not present

## 2020-07-16 DIAGNOSIS — K648 Other hemorrhoids: Secondary | ICD-10-CM | POA: Diagnosis not present

## 2020-07-16 DIAGNOSIS — E785 Hyperlipidemia, unspecified: Secondary | ICD-10-CM

## 2020-07-16 MED ORDER — HYDROCORTISONE ACETATE 25 MG RE SUPP: 25.0000 mg | Freq: Two times a day (BID) | RECTAL | 0 refills | Status: DC

## 2020-07-16 NOTE — Patient Instructions (Signed)
Use the suppositories twice per day until they're gone. And hopefully will take care of the bleeding. Make sure you have softer BMs . If you keep having difficulty with the bleeding then we'll need to get you into see somebody about that and probably the [place].

## 2020-07-16 NOTE — Progress Notes (Signed)
   Subjective:    [name], male    DOB: May 19, 1961, 60 y.o.   MRN: [id number]  HPI He is here for consult concerning rectal bleeding. He has been diagnosed with hemorrhoids when he had a colonoscopy in April of this year through the [place]. Over the last month or so he has noted bright red blood with his BMs but no real pain. This is apparently gotten much worse in the last week.   Review of Systems     Objective:   Physical Exam Rectal exam does show redundant anal tissue but no external hemorrhoid is noted. Digital rectal exam shows no lesions. Minimal stool was present but no blood was visibly seen on exam glove.       Assessment & Plan:  Bleeding internal hemorrhoids - Plan: hydrocortisone (ANUSOL-HC) 25 MG suppository  Need for COVID-19 vaccine - Plan: Moderna SARS-CoV-2 Vaccine I explained that since he had a negative colonoscopy except for adenomatous polyps, there is no worry about colon cancer. Recommend he have softer BMs and use the Anusol regularly. If he continues have difficulty, referral back to [place] would be needed.

## 2020-07-17 ENCOUNTER — Telehealth: Payer: Self-pay

## 2020-07-17 ENCOUNTER — Telehealth: Payer: Self-pay | Admitting: Family Medicine

## 2020-07-17 NOTE — Telephone Encounter (Signed)
Pt returned call to Selena Batten couldn't figure out details of call.  I saw note from Vernona Rieger to him and advised him of the generic anusol and he said please order. Vernona Rieger has ordered.  Selena Batten he wants you to call him again.

## 2020-07-17 NOTE — Telephone Encounter (Signed)
[facility] and they have a generic HC 25mg  suppository that will go thru pt's insurance for $47.00 but they have to order it & it will be in tomorrow.  I called pt and left message

## 2020-07-18 ENCOUNTER — Telehealth: Payer: Self-pay

## 2020-07-18 DIAGNOSIS — K648 Other hemorrhoids: Secondary | ICD-10-CM

## 2020-07-18 MED ORDER — HYDROCORTISONE ACETATE 25 MG RE SUPP: 25.0000 mg | Freq: Two times a day (BID) | RECTAL | 0 refills | Status: DC

## 2020-07-18 NOTE — Telephone Encounter (Signed)
Pt has not got his anusol. Pt was advised if he did not get it by today to call our office and advised so we can see if there is something else that can be sent in. Just FYI

## 2020-07-18 NOTE — Telephone Encounter (Signed)
done

## 2020-07-18 NOTE — Telephone Encounter (Signed)
Pt. Called stating he still has not been able to pick up the hydrocortisone suppository because his pharmacy is out of it and he wanted to know if you could send it to the [facility] on [address] To see if they possibly have it there. Pt. Stated tomorrow will be three days with out getting this medication.

## 2020-07-19 NOTE — Telephone Encounter (Signed)
Got another P.A. so I called new pharmacy & it is $127.  Called original pharmacy [facility] & they still don't have the order in problems with their orders.  They reran the brand with discount card and went thru for $45.49.  Called pt and informed

## 2020-07-19 NOTE — Telephone Encounter (Signed)
Patient is scheduled for lab study on 08/29/20. Patient understands his sleep study will be done at [facility] sleep lab. Patient understands he will receive a sleep packet in a week or so. Patient understands to call if he does not receive the sleep packet in a timely manner. Patient agrees with treatment and thanked me for call.

## 2020-07-21 NOTE — Telephone Encounter (Signed)
done

## 2020-07-22 ENCOUNTER — Other Ambulatory Visit: Payer: Self-pay

## 2020-07-22 ENCOUNTER — Observation Stay (HOSPITAL_COMMUNITY)
Admission: EM | Admit: 2020-07-22 | Discharge: 2020-07-23 | Disposition: A | Discharge status: Home / Self Care | Payer: BC Managed Care – PPO | Attending: Internal Medicine | Admitting: Internal Medicine

## 2020-07-22 ENCOUNTER — Encounter (HOSPITAL_COMMUNITY): Payer: Self-pay | Admitting: Student

## 2020-07-22 ENCOUNTER — Other Ambulatory Visit: Payer: Self-pay | Admitting: Medical

## 2020-07-22 DIAGNOSIS — Z20822 Contact with and (suspected) exposure to covid-19: Secondary | ICD-10-CM | POA: Insufficient documentation

## 2020-07-22 DIAGNOSIS — I480 Paroxysmal atrial fibrillation: Secondary | ICD-10-CM | POA: Diagnosis present

## 2020-07-22 DIAGNOSIS — I10 Essential (primary) hypertension: Secondary | ICD-10-CM | POA: Diagnosis present

## 2020-07-22 DIAGNOSIS — Z8616 Personal history of COVID-19: Secondary | ICD-10-CM | POA: Insufficient documentation

## 2020-07-22 DIAGNOSIS — Z7901 Long term (current) use of anticoagulants: Secondary | ICD-10-CM | POA: Diagnosis not present

## 2020-07-22 DIAGNOSIS — K922 Gastrointestinal hemorrhage, unspecified: Secondary | ICD-10-CM

## 2020-07-22 DIAGNOSIS — E039 Hypothyroidism, unspecified: Secondary | ICD-10-CM | POA: Diagnosis not present

## 2020-07-22 DIAGNOSIS — Z79899 Other long term (current) drug therapy: Secondary | ICD-10-CM | POA: Insufficient documentation

## 2020-07-22 DIAGNOSIS — D539 Nutritional anemia, unspecified: Secondary | ICD-10-CM | POA: Diagnosis not present

## 2020-07-22 DIAGNOSIS — K625 Hemorrhage of anus and rectum: Secondary | ICD-10-CM | POA: Diagnosis not present

## 2020-07-22 DIAGNOSIS — E785 Hyperlipidemia, unspecified: Secondary | ICD-10-CM | POA: Diagnosis present

## 2020-07-22 LAB — CBC
HCT: 30.1 % — ABNORMAL LOW (ref 39.0–52.0)
HCT: 28.6 % — ABNORMAL LOW (ref 39.0–52.0)
Hemoglobin: 9.5 g/dL — ABNORMAL LOW (ref 13.0–17.0)
Hemoglobin: 9.1 g/dL — ABNORMAL LOW (ref 13.0–17.0)
MCH: 31.6 pg (ref 26.0–34.0)
MCH: 31.3 pg (ref 26.0–34.0)
MCHC: 31.6 g/dL (ref 30.0–36.0)
MCHC: 31.8 g/dL (ref 30.0–36.0)
MCV: 100 fL (ref 80.0–100.0)
MCV: 98.3 fL (ref 80.0–100.0)
Platelets: 223 10*3/uL (ref 150–400)
Platelets: 218 10*3/uL (ref 150–400)
RBC: 3.01 MIL/uL — ABNORMAL LOW (ref 4.22–5.81)
RBC: 2.91 MIL/uL — ABNORMAL LOW (ref 4.22–5.81)
RDW: 13.2 % (ref 11.5–15.5)
RDW: 13.2 % (ref 11.5–15.5)
WBC: 5.6 10*3/uL (ref 4.0–10.5)
WBC: 6.4 10*3/uL (ref 4.0–10.5)
nRBC: 0 % (ref 0.0–0.2)
nRBC: 0 % (ref 0.0–0.2)

## 2020-07-22 LAB — COMPREHENSIVE METABOLIC PANEL
ALT: 14 U/L (ref 0–44)
AST: 14 U/L — ABNORMAL LOW (ref 15–41)
Albumin: 3.5 g/dL (ref 3.5–5.0)
Alkaline Phosphatase: 37 U/L — ABNORMAL LOW (ref 38–126)
Anion gap: 9 (ref 5–15)
BUN: 27 mg/dL — ABNORMAL HIGH (ref 8–23)
CO2: 30 mmol/L (ref 22–32)
Calcium: 9.1 mg/dL (ref 8.9–10.3)
Chloride: 102 mmol/L (ref 98–111)
Creatinine, Ser: 1.2 mg/dL (ref 0.61–1.24)
GFR, Estimated: >60 mL/min (ref >60)
Glucose, Bld: 119 mg/dL — ABNORMAL HIGH (ref 70–99)
Potassium: 3.5 mmol/L (ref 3.5–5.1)
Sodium: 141 mmol/L (ref 135–145)
Total Bilirubin: 0.8 mg/dL (ref 0.3–1.2)
Total Protein: 6.8 g/dL (ref 6.5–8.1)

## 2020-07-22 LAB — TYPE AND SCREEN
ABO/RH(D): B POS
Antibody Screen: NEGATIVE

## 2020-07-22 LAB — VITAMIN B12: Vitamin B-12: 465 pg/mL (ref 180–914)

## 2020-07-22 LAB — PROTIME-INR
INR: 2 — ABNORMAL HIGH (ref 0.8–1.2)
Prothrombin Time: 22.3 seconds — ABNORMAL HIGH (ref 11.4–15.2)

## 2020-07-22 LAB — ABO/RH: ABO/RH(D): B POS

## 2020-07-22 LAB — FOLATE: Folate: 11.5 ng/mL (ref >5.9)

## 2020-07-22 LAB — TSH: TSH: 3.223 u[IU]/mL (ref 0.350–4.500)

## 2020-07-22 LAB — HIV ANTIBODY (ROUTINE TESTING W REFLEX): HIV Screen 4th Generation wRfx: NONREACTIVE

## 2020-07-22 MED ORDER — CARBOXYMETHYLCELLULOSE SODIUM 0.5 % OP SOLN: 1.0000 [drp] | Freq: Every day | OPHTHALMIC | Status: DC | PRN

## 2020-07-22 MED ORDER — LOSARTAN POTASSIUM 25 MG PO TABS
100.0000 mg | ORAL_TABLET | Freq: Every day | ORAL | Status: DC
  Administered 2020-07-22 – 2020-07-23 (×2): 100 mg via ORAL
  Filled 2020-07-22 (×2): qty 4

## 2020-07-22 MED ORDER — POLYVINYL ALCOHOL 1.4 % OP SOLN: 1.0000 [drp] | OPHTHALMIC | Status: DC | PRN

## 2020-07-22 MED ORDER — HYDRALAZINE HCL 25 MG PO TABS: 25.0000 mg | ORAL_TABLET | ORAL | Status: DC | PRN

## 2020-07-22 MED ORDER — ADULT MULTIVITAMIN W/MINERALS CH
1.0000 | ORAL_TABLET | Freq: Every day | ORAL | Status: DC
  Administered 2020-07-23: 1 via ORAL
  Filled 2020-07-22: qty 1

## 2020-07-22 MED ORDER — MULTIVITAMINS PO CAPS: 1.0000 | ORAL_CAPSULE | Freq: Every day | ORAL | Status: DC

## 2020-07-22 MED ORDER — CHLORTHALIDONE 25 MG PO TABS
25.0000 mg | ORAL_TABLET | Freq: Every day | ORAL | Status: DC
  Administered 2020-07-22: 25 mg via ORAL
  Filled 2020-07-22 (×2): qty 1

## 2020-07-22 MED ORDER — ACETAMINOPHEN 650 MG RE SUPP: 650.0000 mg | Freq: Four times a day (QID) | RECTAL | Status: DC | PRN

## 2020-07-22 MED ORDER — SODIUM CHLORIDE 0.9 % IV SOLN: INTRAVENOUS | Status: DC

## 2020-07-22 MED ORDER — CARVEDILOL 12.5 MG PO TABS
25.0000 mg | ORAL_TABLET | Freq: Two times a day (BID) | ORAL | Status: DC
  Administered 2020-07-22 – 2020-07-23 (×2): 25 mg via ORAL
  Filled 2020-07-22 (×2): qty 2

## 2020-07-22 MED ORDER — LABETALOL HCL 5 MG/ML IV SOLN: 10.0000 mg | INTRAVENOUS | Status: DC | PRN

## 2020-07-22 MED ORDER — ACETAMINOPHEN 325 MG PO TABS
650.0000 mg | ORAL_TABLET | Freq: Four times a day (QID) | ORAL | Status: DC | PRN
  Administered 2020-07-23: 650 mg via ORAL
  Filled 2020-07-22: qty 2

## 2020-07-22 MED ORDER — DILTIAZEM HCL ER COATED BEADS 180 MG PO CP24
300.0000 mg | ORAL_CAPSULE | Freq: Every day | ORAL | Status: DC
  Administered 2020-07-22 – 2020-07-23 (×2): 300 mg via ORAL
  Filled 2020-07-22 (×2): qty 1

## 2020-07-22 NOTE — Consult Note (Signed)
[facility] Surgery Consult Note  [place] May 12, 1961  [id number].    Requesting MD: Lewie Chamber Chief Complaint/Reason for Consult: bleeding hemorrhoids   HPI:  Martin [name] is a 60yo male PMH HTN, HLD, OSA, recent covid-19 infection 05/2020 complicated by DVT/PE and PAF currently on xarelto (last dose 1/30) who presented to the [facility] with painless rectal bleeding. He reports known history of hemorrhoids. States that the bleeding started about 2 weeks ago. Initially it was intermittent and mild. He tried using Anusol suppositories with minimal benefit. The bleeding became significantly worse over the last 1-2 days so he came to the ED. He reports some dizziness/lightheadedness at times. Denies abdominal pain, nausea, vomiting. In the ED hemoglobin was found to be 9.5 (baseline 11-12). Gastroenterology was consulted who called general surgery to evaluate.   Abdominal surgical history: cholecystectomy Last colonoscopy at [facility] 09/2019 revealed precancerous polyps, recommended repeat colonoscopy 2024  Review of Systems  Respiratory: Negative.   Cardiovascular: Negative.   Gastrointestinal: Positive for blood in stool. Negative for abdominal pain, nausea and vomiting.  Neurological: Positive for dizziness.   All systems reviewed and otherwise negative except for as above  Family History  Problem Relation Age of Onset  . Kidney disease Mother   . Hypertension Father   . Kidney disease Father   . Asthma Father     Past Medical History:  Diagnosis Date  . Cardiomegaly    LVH  . Cataract   . Hemorrhoids   . Hyperlipidemia   . Hypertension   . Hypothyroidism   . Lower GI bleeding   . OSA (obstructive sleep apnea)    mild OSA with an AHI of 8.1/hr but severe during REM sleep with AHI 43/hr.  He had O2 desats as low as 85%.  [name] PAF (paroxysmal atrial fibrillation) (HCC)    in setting of acute cholecystitis.  No anticoagulation due to setting of acute illness  and CHADS2VAS score of 1    Past Surgical History:  Procedure Laterality Date  . CHOLECYSTECTOMY N/A 06/01/2019   Procedure: LAPAROSCOPIC CHOLECYSTECTOMY;  Surgeon: Manus Rudd, MD;  Location: [facility] OR;  Service: General;  Laterality: N/A;  . DOPPLER ECHOCARDIOGRAPHY      Social History:  reports that he has never smoked. He has never used smokeless tobacco. He reports current alcohol use. He reports that he does not use drugs.  Allergies:  Allergies  Allergen Reactions  . Garlic Swelling  . Shrimp [Shellfish Allergy] Swelling    (Not in a hospital admission)   Prior to Admission medications   Medication Sig Start Date End Date Taking? Authorizing Provider  carboxymethylcellulose (REFRESH PLUS) 0.5 % SOLN Place 1 drop into both eyes daily as needed (dry eyes).   Yes [provider]  carvedilol (COREG) 25 MG tablet Take 1 tablet (25 mg total) by mouth 2 (two) times daily. 08/29/19  Yes Turner, Cornelious Bryant, MD  chlorthalidone (HYGROTON) 25 MG tablet Take 1 tablet (25 mg total) by mouth daily. 02/19/20  Yes Turner, Cornelious Bryant, MD  diltiazem (CARDIZEM CD) 300 MG 24 hr capsule Take 300 mg by mouth daily. 06/17/20  Yes [provider]  hydrocortisone (ANUSOL-HC) 25 MG suppository Place 1 suppository (25 mg total) rectally 2 (two) times daily. 07/18/20  Yes Ronnald Nian, MD  losartan (COZAAR) 50 MG tablet Take 2 tablets (100 mg total) by mouth daily. 06/24/20  Yes Turner, Cornelious Bryant, MD  Multiple Vitamin (MULTIVITAMIN) capsule Take 1 capsule by mouth daily.  Yes [provider]  pravastatin (PRAVACHOL) 40 MG tablet Take 1 tablet by mouth once daily Patient taking differently: Take 40 mg by mouth daily. 07/16/20  Yes Ronnald Nian, MD  Rivaroxaban (XARELTO) 15 MG TABS tablet Take 1 tablet (15 mg total) by mouth 2 (two) times daily with a meal. 06/28/20  Yes Tysinger, Kermit Balo, PA-C    Blood pressure (!) 139/101, pulse 68, temperature 98.5 F (36.9 C), temperature source  Oral, resp. rate (!) 22, height 6' 1.5" (1.867 m), weight 125 kg, SpO2 100 %. Physical Exam: General: pleasant, WD/WN male who is laying in bed in NAD HEENT: head is normocephalic, atraumatic.  Sclera are noninjected.  PERRL.  Ears and nose without any masses or lesions.  Mouth is pink and moist. Dentition fair Heart: regular, rate, and rhythm.  Normal s1,s2. No obvious murmurs, gallops, or rubs noted.  Feet WWP bilaterally Lungs: CTAB, no wheezes, rhonchi, or rales noted.  Respiratory effort nonlabored Abd: soft, NT/ND, +BS, no masses, hernias, or organomegaly MS: no BUE/BLE edema, calves soft and nontender Skin: warm and dry with no masses, lesions, or rashes Psych: A&Ox4 with an appropriate affect Neuro: cranial nerves grossly intact, equal strength in BUE/BLE bilaterally, normal speech, thought process intact GU: several large external hemorrhoids and protruding internal hemorrhoids with trace blood oozing      Results for orders placed or performed during the hospital encounter of 07/22/20 (from the past 48 hour(s))  CBC     Status: Abnormal   Collection Time: 07/22/20 10:20 AM  Result Value Ref Range   WBC 5.6 4.0 - 10.5 K/uL   RBC 3.01 (L) 4.22 - 5.81 MIL/uL   Hemoglobin 9.5 (L) 13.0 - 17.0 g/dL   HCT 28.7 (L) 86.7 - 67.2 %   MCV 100.0 80.0 - 100.0 fL   MCH 31.6 26.0 - 34.0 pg   MCHC 31.6 30.0 - 36.0 g/dL   RDW 09.4 70.9 - 62.8 %   Platelets 223 150 - 400 K/uL   nRBC 0.0 0.0 - 0.2 %    Comment: Performed at [facility], [address], [place], [place] [address]  Type and screen [facility]     Status: None   Collection Time: 07/22/20 10:20 AM  Result Value Ref Range   ABO/RH(D) B POS    Antibody Screen NEG    Sample Expiration      07/25/2020,2359 Performed at [facility], [address] [place], [place] [address]   Protime-INR - (order if Patient is taking Coumadin / Warfarin)     Status: Abnormal    Collection Time: 07/22/20 10:20 AM  Result Value Ref Range   Prothrombin Time 22.3 (H) 11.4 - 15.2 seconds   INR 2.0 (H) 0.8 - 1.2    Comment: (NOTE) INR goal varies based on device and disease states. Performed at [facility], [address] [place], [place] [address]   ABO/Rh     Status: None   Collection Time: 07/22/20 10:29 AM  Result Value Ref Range   ABO/RH(D)      B POS Performed at [facility], [address] [place], [place] [address]   Comprehensive metabolic panel     Status: Abnormal   Collection Time: 07/22/20 11:14 AM  Result Value Ref Range   Sodium 141 135 - 145 mmol/L   Potassium 3.5 3.5 - 5.1 mmol/L   Chloride 102 98 - 111 mmol/L  CO2 30 22 - 32 mmol/L   Glucose, Bld 119 (H) 70 - 99 mg/dL    Comment: Glucose reference range applies only to samples taken after fasting for at least 8 hours.   BUN 27 (H) 8 - 23 mg/dL   Creatinine, Ser 7.35 0.61 - 1.24 mg/dL   Calcium 9.1 8.9 - 32.9 mg/dL   Total Protein 6.8 6.5 - 8.1 g/dL   Albumin 3.5 3.5 - 5.0 g/dL   AST 14 (L) 15 - 41 U/L   ALT 14 0 - 44 U/L   Alkaline Phosphatase 37 (L) 38 - 126 U/L   Total Bilirubin 0.8 0.3 - 1.2 mg/dL   GFR, Estimated >92 >42 mL/min    Comment: (NOTE) Calculated using the CKD-EPI Creatinine Equation (2021)    Anion gap 9 5 - 15    Comment: Performed at [facility], [address], [place], [place] [address]  TSH     Status: None   Collection Time: 07/22/20  1:35 PM  Result Value Ref Range   TSH 3.223 0.350 - 4.500 uIU/mL    Comment: Performed by a 3rd Generation assay with a functional sensitivity of <=0.01 uIU/mL. Performed at [facility], [address], [place], [place] [address]   Vitamin B12     Status: None   Collection Time: 07/22/20  1:35 PM  Result Value Ref Range   Vitamin B-12 465 180 - 914 pg/mL    Comment: (NOTE) This assay is not validated for testing neonatal or myeloproliferative  syndrome specimens for Vitamin B12 levels. Performed at [facility], [address], [place], [place] [address]   Folate, serum, performed at [facility] lab     Status: None   Collection Time: 07/22/20  1:35 PM  Result Value Ref Range   Folate 11.5 >5.9 ng/mL    Comment: Performed at [facility], [address] [place], [place] [address]   No results found.    Assessment/Plan HTN HLD OSA Recent covid-19 infection 05/2020 Acute DVT/PE 05/2020 on xarelto - last dose 1/30 PAF  Acute on chronic anemia - Hgb 9.5 (baseline 11-12), monitor  Bleeding hemorrhoids  - Gel foam inserted into rectum for hemostasis. Leave in place until tomorrow if it does not fall out. No indication for acute surgical intervention. Continue holding xarelto. Ok for diet.  ID - none VTE - SCDs only for now, hold xarelto FEN - ok for diet Foley - none Follow up - TBD  Franne Forts, [facility] Surgery 07/22/2020, 3:29 PM Please see Amion for pager number during day hours 7:00am-4:30pm

## 2020-07-22 NOTE — Assessment & Plan Note (Signed)
-  Differential includes hemorrhoidal bleeding versus recurrent polyp versus other etiology; Less likely to be UGIB given stable vitals (elevated BP) - hold xarelto for now; likely okay to resume at discharge pending GI evaluation - trend H/H - start on IVF - transfuse if Hgb <7 g/dL

## 2020-07-22 NOTE — Consult Note (Addendum)
Referring Provider:  [facility]         Primary Care Physician:  Ronnald Nian, MD Primary Gastroenterologist:  With the [facility]   (previously followed by Dr Juanda Chance)       We were asked to see this patient for:   Rectal bleeding               Attending physician's note   I have taken a history, examined the patient and reviewed the chart. I agree with the Advanced Practitioner's note, impression and recommendations.  60 year old very pleasant gentleman presented to ER with painless rectal bleeding.  He has been having on and off rectal bleeding with bowel movement for past 2 weeks, progressively worse with continuous oozing of blood from hemorrhoids since yesterday. He is on chronic anticoagulation with Xarelto  Last colonoscopy at [facility] last year, report is not available to review but according to patient he had 2 small polyps removed and was recommended to undergo repeat colonoscopy in 2024 for surveillance.  He has large prolapsed hemorrhoids Hemoglobin dropped from baseline 11-12 to 9 We will request surgery to evaluate for management of active hemorrhage from prolapsed hemorrhoids Hold Xarelto Monitor hemoglobin and transfuse if less than 7   The patient was provided an opportunity to ask questions and all were answered. The patient agreed with the plan and demonstrated an understanding of the instructions.  Iona Beard , MD [phone]   ASSESSMENT / PLAN:    # 60 yo male with acute on chronic painless rectal bleeding. Increased volume of blood over last couple of days in setting of anticoagulant started ~ 1 month ago. On exam he has very large internal / external hemorrhoids, one partly thrombosed. Hgb down to 9.5, compared to baseline of around 11-12.  --I have asked [facility] to evaluate patient. He is actively bleeding from hemorrhoids.   # PAF, last dose of Xarelto was yesterday.      HPI:                                                                                                                              Chief Complaint: rectal bleeding  Martin [name] is a 60 y.o. male with PMH significant for PAF, on Xarelto, HTN , hyperlipidemia,  OSA, hypothyroidism, obesity, hemorrhoids  Patient has a long standing history of minor rectal bleeding with BMs. He started anticoagulants about one month ago. On Saturday he passed a large amount of bright red red. No BMs or bleeding Sunday ( yesterday) but today had a BM with large amount of red blood with some clots. Bleeding is painless. He very occasionally has constipation but for the most part BMs are normal. He gives a hstory of a colonoscopy one year ago at the [place]. Two polyps were removed, due for surveillance colonoscopy in in 2024. Patient takes no NSAIDS. He has had a decline in hgb. He is hemodynamically stable.  PREVIOUS ENDOSCOPIC EVALUATIONS / PERTINENT STUDIES    Reports colonoscopy two years at [facility] hospital with removal of 2 polyps.   2010 screening colonoscopy - Dr. Juanda Chance - normal.   Past Medical History:  Diagnosis Date  . Cardiomegaly    LVH  . Cataract   . Hemorrhoids   . Hyperlipidemia   . Hypertension   . Hypothyroidism   . Lower GI bleeding   . OSA (obstructive sleep apnea)    mild OSA with an AHI of 8.1/hr but severe during REM sleep with AHI 43/hr.  He had O2 desats as low as 85%.  [name] PAF (paroxysmal atrial fibrillation) (HCC)    in setting of acute cholecystitis.  No anticoagulation due to setting of acute illness and CHADS2VAS score of 1    Past Surgical History:  Procedure Laterality Date  . CHOLECYSTECTOMY N/A 06/01/2019   Procedure: LAPAROSCOPIC CHOLECYSTECTOMY;  Surgeon: Manus Rudd, MD;  Location: [facility] OR;  Service: General;  Laterality: N/A;  . DOPPLER ECHOCARDIOGRAPHY      Prior to Admission medications   Medication Sig Start Date End Date Taking? Authorizing Provider  carboxymethylcellulose (REFRESH PLUS) 0.5 % SOLN Place 1 drop into both  eyes daily as needed (dry eyes).   Yes [provider]  carvedilol (COREG) 25 MG tablet Take 1 tablet (25 mg total) by mouth 2 (two) times daily. 08/29/19  Yes Turner, Cornelious Bryant, MD  chlorthalidone (HYGROTON) 25 MG tablet Take 1 tablet (25 mg total) by mouth daily. 02/19/20  Yes Turner, Cornelious Bryant, MD  diltiazem (CARDIZEM CD) 300 MG 24 hr capsule Take 300 mg by mouth daily. 06/17/20  Yes [provider]  hydrocortisone (ANUSOL-HC) 25 MG suppository Place 1 suppository (25 mg total) rectally 2 (two) times daily. 07/18/20  Yes Ronnald Nian, MD  losartan (COZAAR) 50 MG tablet Take 2 tablets (100 mg total) by mouth daily. 06/24/20  Yes Turner, Cornelious Bryant, MD  Multiple Vitamin (MULTIVITAMIN) capsule Take 1 capsule by mouth daily.   Yes [provider]  pravastatin (PRAVACHOL) 40 MG tablet Take 1 tablet by mouth once daily Patient taking differently: Take 40 mg by mouth daily. 07/16/20  Yes Ronnald Nian, MD  Rivaroxaban (XARELTO) 15 MG TABS tablet Take 1 tablet (15 mg total) by mouth 2 (two) times daily with a meal. 06/28/20  Yes Tysinger, Kermit Balo, PA-C    No current facility-administered medications for this encounter.   Current Outpatient Medications  Medication Sig Dispense Refill  . carboxymethylcellulose (REFRESH PLUS) 0.5 % SOLN Place 1 drop into both eyes daily as needed (dry eyes).    . carvedilol (COREG) 25 MG tablet Take 1 tablet (25 mg total) by mouth 2 (two) times daily. 180 tablet 3  . chlorthalidone (HYGROTON) 25 MG tablet Take 1 tablet (25 mg total) by mouth daily. 30 tablet 6  . diltiazem (CARDIZEM CD) 300 MG 24 hr capsule Take 300 mg by mouth daily.    . hydrocortisone (ANUSOL-HC) 25 MG suppository Place 1 suppository (25 mg total) rectally 2 (two) times daily. 20 suppository 0  . losartan (COZAAR) 50 MG tablet Take 2 tablets (100 mg total) by mouth daily. 180 tablet 3  . Multiple Vitamin (MULTIVITAMIN) capsule Take 1 capsule by mouth daily.    . pravastatin  (PRAVACHOL) 40 MG tablet Take 1 tablet by mouth once daily (Patient taking differently: Take 40 mg by mouth daily.) 90 tablet 0  . Rivaroxaban (XARELTO) 15 MG TABS tablet Take 1 tablet (15  mg total) by mouth 2 (two) times daily with a meal. 42 tablet 0    Allergies as of 07/22/2020 - Review Complete 07/22/2020  Allergen Reaction Noted  . Garlic Swelling 01/21/2014  . Shrimp [shellfish allergy] Swelling 01/21/2014    Family History  Problem Relation Age of Onset  . Kidney disease Mother   . Hypertension Father   . Kidney disease Father   . Asthma Father     Social History   Socioeconomic History  . Marital status: Married    Spouse name: Not on file  . Number of children: 1  . Years of education: Not on file  . Highest education level: Not on file  Occupational History  . Occupation: Warden/ranger: [employer]  Tobacco Use  . Smoking status: Never Smoker  . Smokeless tobacco: Never Used  Substance and Sexual Activity  . Alcohol use: Yes    Comment: Rare  . Drug use: No  . Sexual activity: Yes  Other Topics Concern  . Not on file  Social History Narrative  . Not on file   Social Determinants of Health   Financial Resource Strain: Not on file  Food Insecurity: Not on file  Transportation Needs: Not on file  Physical Activity: Not on file  Stress: Not on file  Social Connections: Not on file  Intimate Partner Violence: Not on file    Review of Systems: All systems reviewed and negative except where noted in HPI.    OBJECTIVE:    Physical Exam: Vital signs in last 24 hours: Temp:  [98.5 F (36.9 C)] 98.5 F (36.9 C) (01/31 1025) Pulse Rate:  [69-78] 72 (01/31 1300) Resp:  [17-30] 20 (01/31 1300) BP: (135-197)/(87-108) 197/108 (01/31 1300) SpO2:  [96 %-100 %] 100 % (01/31 1300) Weight:  [756 kg] 125 kg (01/31 1015)   General:   Alert  male in NAD Psych:  Pleasant, cooperative. Normal mood and affect. Eyes:  Pupils equal, sclera clear, no icterus.    Conjunctiva pink. Ears:  Normal auditory acuity. Nose:  No deformity, discharge,  or lesions. Neck:  Supple; no masses Lungs:  Clear throughout to auscultation.   No wheezes, crackles, or rhonchi.  Heart:  Regular rate and rhythm; no murmurs, no lower extremity edema Abdomen:  Soft, non-distended, nontender, BS active, no palp mass   Rectal:  Large external hemorrhoids. Protruding internal hemorrhoids, could not reduce due to patient's discomfort. One partly thrombosed hemorrhoid posterior hermorrhoid.  Msk:  Symmetrical without gross deformities. . Neurologic:  Alert and  oriented x4;  grossly normal neurologically. Skin:  Intact without significant lesions or rashes.  Filed Weights   07/22/20 1015  Weight: 125 kg     Scheduled inpatient medications     Intake/Output from previous day: No intake/output data recorded. Intake/Output this shift: No intake/output data recorded.   Lab Results: Recent Labs    07/22/20 1020  WBC 5.6  HGB 9.5*  HCT 30.1*  PLT 223   BMET Recent Labs    07/22/20 1114  NA 141  K 3.5  CL 102  CO2 30  GLUCOSE 119*  BUN 27*  CREATININE 1.20  CALCIUM 9.1   LFT Recent Labs    07/22/20 1114  PROT 6.8  ALBUMIN 3.5  AST 14*  ALT 14  ALKPHOS 37*  BILITOT 0.8   PT/INR Recent Labs    07/22/20 1020  LABPROT 22.3*  INR 2.0*   Hepatitis Panel No results for input(s): HEPBSAG, HCVAB,  HEPAIGM, HEPBIGM in the last 72 hours.   . CBC Latest Ref Rng & Units 07/22/2020 06/11/2020 05/28/2020  WBC 4.0 - 10.5 K/uL 5.6 7.3 3.6(L)  Hemoglobin 13.0 - 17.0 g/dL 4.1(D) 11.0(L) 11.7(L)  Hematocrit 39.0 - 52.0 % 30.1(L) 34.6(L) 35.9(L)  Platelets 150 - 400 K/uL 223 465(H) 93(L)    . CMP Latest Ref Rng & Units 07/22/2020 06/11/2020 05/27/2020  Glucose 70 - 99 mg/dL 408(X) 448(J) 856(D)  BUN 8 - 23 mg/dL 14(H) 13 21  Creatinine 0.61 - 1.24 mg/dL 7.02 6.37(C) 5.88(F)  Sodium 135 - 145 mmol/L 141 144 141  Potassium 3.5 - 5.1 mmol/L 3.5 4.5 3.7   Chloride 98 - 111 mmol/L 102 105 103  CO2 22 - 32 mmol/L 30 26 27   Calcium 8.9 - 10.3 mg/dL 9.1 9.9 )  Total Protein 6.5 - 8.1 g/dL 6.8 6.7 -  Total Bilirubin 0.3 - 1.2 mg/dL 0.8 0.9 -  Alkaline Phos 38 - 126 U/L 37(L) 58 -  AST 15 - 41 U/L 14(L) 9 -  ALT 0 - 44 U/L 14 13 -   Studies/Results: No results found.  Principal Problem:   Rectal bleeding Active Problems:   Resistant hypertension   Hyperlipidemia with target LDL less than 100   Hypothyroid   PAF (paroxysmal atrial fibrillation) (HCC)   Macrocytic anemia    0.2(D, NP-C @  07/22/2020, 2:18 PM

## 2020-07-22 NOTE — Assessment & Plan Note (Signed)
-   patient endorses ~140s/80-90s at home - last echo 05/2020: EF 55-60%, Gr1DD, no LV WMA, no LVH - continue coreg, chlorthalidone, diltiazem, losartan - outpatient followup for further management or adjustment unless remains elevated inpatient, then likely warrants some adjustment - monitor vitals especially in setting of rectal bleeding

## 2020-07-22 NOTE — Assessment & Plan Note (Signed)
-  Hold pravastatin, can resume at discharge

## 2020-07-22 NOTE — H&P (Signed)
History and Physical    [facility]  QAS:[id number]  DOB: 04-25-1961  DOA: 07/22/2020  PCP: Ronnald Nian, MD Patient coming from: home  Chief Complaint: rectal bleeding  HPI:  Martin [name] is a 60 yo AA male with PMH polyps (last CLN 2021), PAF (on Xarelto), resistant HTN, HLD, OSA, hypothyroidism who presented to the ER due to rectal bleeding at home. His symptoms started on 07/17/2019.  He was seen at his primary care office and was ordered Anusol suppositories due to suspicion for internal hemorrhoid bleeding however due to financial constraints did not start taking until about 07/19/20.  He has had ongoing bleeding but his wife also states that the suppository has not stayed in long. In the ER he was noted to have ongoing rectal bleeding.  This was packed with gauze temporarily and GI was consulted. Patient last took his Xarelto on the evening of 07/21/2020.  Hemoglobin was 9.5 g/dL on evaluation with typical baseline ~11-12 g/dL. He is admitted for observation and further evaluation by GI.    I have personally briefly reviewed patient's old medical records in [facility] and discussed patient with the ER provider when appropriate/indicated.  Assessment/Plan: * Rectal bleeding -Differential includes hemorrhoidal bleeding versus recurrent polyp versus other etiology; Less likely to be UGIB given stable vitals (elevated BP) - hold xarelto for now; likely okay to resume at discharge pending GI evaluation - trend H/H - start on IVF - transfuse if Hgb <7 g/dL  Macrocytic anemia - likely due to ABLA but also appears to have chronic component  - baseline Hgb 11-12 g/dL. Hgb on admission 9.5 - see rectal bleeding - check B12, folate  Resistant hypertension - patient endorses ~140s/80-90s at home - last echo 05/2020: EF 55-60%, Gr1DD, no LV WMA, no LVH - continue coreg, chlorthalidone, diltiazem, losartan - outpatient followup for further management or adjustment  unless remains elevated inpatient, then likely warrants some adjustment - monitor vitals especially in setting of rectal bleeding   PAF (paroxysmal atrial fibrillation) (HCC) -On Coreg and Cardizem as part of rate control -Hold Xarelto for now, likely can resume at discharge pending GI work-up  Hypothyroid -No home medications seen on med rec -TSH relatively stable from trend -Repeat TSH now  Hyperlipidemia with target LDL less than 100 -Hold pravastatin, can resume at discharge     Code Status: Full DVT Prophylaxis: SCD Anticipated disposition is to: home  History: Past Medical History:  Diagnosis Date  . Cardiomegaly    LVH  . Cataract   . Hemorrhoids   . Hyperlipidemia   . Hypertension   . Hypothyroidism   . Lower GI bleeding   . OSA (obstructive sleep apnea)    mild OSA with an AHI of 8.1/hr but severe during REM sleep with AHI 43/hr.  He had O2 desats as low as 85%.  [name] PAF (paroxysmal atrial fibrillation) (HCC)    in setting of acute cholecystitis.  No anticoagulation due to setting of acute illness and CHADS2VAS score of 1    Past Surgical History:  Procedure Laterality Date  . CHOLECYSTECTOMY N/A 06/01/2019   Procedure: LAPAROSCOPIC CHOLECYSTECTOMY;  Surgeon: Manus Rudd, MD;  Location: [facility] OR;  Service: General;  Laterality: N/A;  . DOPPLER ECHOCARDIOGRAPHY       reports that he has never smoked. He has never used smokeless tobacco. He reports current alcohol use. He reports that he does not use drugs.  Allergies  Allergen Reactions  . Garlic Swelling  .  Shrimp [Shellfish Allergy] Swelling    Family History  Problem Relation Age of Onset  . Kidney disease Mother   . Hypertension Father   . Kidney disease Father   . Asthma Father    Home Medications: Prior to Admission medications   Medication Sig Start Date End Date Taking? Authorizing Provider  carboxymethylcellulose (REFRESH PLUS) 0.5 % SOLN Place 1 drop into both eyes daily as needed (dry  eyes).   Yes [provider]  carvedilol (COREG) 25 MG tablet Take 1 tablet (25 mg total) by mouth 2 (two) times daily. 08/29/19  Yes Turner, Cornelious Bryant, MD  chlorthalidone (HYGROTON) 25 MG tablet Take 1 tablet (25 mg total) by mouth daily. 02/19/20  Yes Turner, Cornelious Bryant, MD  diltiazem (CARDIZEM CD) 300 MG 24 hr capsule Take 300 mg by mouth daily. 06/17/20  Yes [provider]  hydrocortisone (ANUSOL-HC) 25 MG suppository Place 1 suppository (25 mg total) rectally 2 (two) times daily. 07/18/20  Yes Ronnald Nian, MD  losartan (COZAAR) 50 MG tablet Take 2 tablets (100 mg total) by mouth daily. 06/24/20  Yes Turner, Cornelious Bryant, MD  Multiple Vitamin (MULTIVITAMIN) capsule Take 1 capsule by mouth daily.   Yes [provider]  pravastatin (PRAVACHOL) 40 MG tablet Take 1 tablet by mouth once daily Patient taking differently: Take 40 mg by mouth daily. 07/16/20  Yes Ronnald Nian, MD  Rivaroxaban (XARELTO) 15 MG TABS tablet Take 1 tablet (15 mg total) by mouth 2 (two) times daily with a meal. 06/28/20  Yes Tysinger, Kermit Balo, PA-C    Review of Systems:  Pertinent items noted in HPI and remainder of comprehensive ROS otherwise negative.  Physical Exam: Vitals:   07/22/20 1200 07/22/20 1215 07/22/20 1230 07/22/20 1300  BP: (!) 138/94 (!) 138/94 (!) 135/98 (!) 197/108  Pulse: 69 69 69 72  Resp: 20 20 (!) 22 20  Temp:      TempSrc:      SpO2: 100% 100% 100% 100%  Weight:      Height:       General appearance: alert, cooperative and no distress Head: Normocephalic, without obvious abnormality, atraumatic Eyes: EOMI Lungs: clear to auscultation bilaterally Heart: regular rate and rhythm and S1, S2 normal Abdomen: normal findings: bowel sounds normal and soft, non-tender Extremities: no edema Skin: mobility and turgor normal Neurologic: Grossly normal  Labs on Admission:  I have personally reviewed following labs and imaging studies Results for orders placed or performed during  the hospital encounter of 07/22/20 (from the past 24 hour(s))  CBC     Status: Abnormal   Collection Time: 07/22/20 10:20 AM  Result Value Ref Range   WBC 5.6 4.0 - 10.5 K/uL   RBC 3.01 (L) 4.22 - 5.81 MIL/uL   Hemoglobin 9.5 (L) 13.0 - 17.0 g/dL   HCT 55.3 (L) 74.8 - 27.0 %   MCV 100.0 80.0 - 100.0 fL   MCH 31.6 26.0 - 34.0 pg   MCHC 31.6 30.0 - 36.0 g/dL   RDW 78.6 75.4 - 49.2 %   Platelets 223 150 - 400 K/uL   nRBC 0.0 0.0 - 0.2 %  Type and screen [facility]     Status: None   Collection Time: 07/22/20 10:20 AM  Result Value Ref Range   ABO/RH(D) B POS    Antibody Screen NEG    Sample Expiration      07/25/2020,2359 Performed at [facility], [address]  [place], [place] [address]   Protime-INR - (order if Patient is taking Coumadin / Warfarin)     Status: Abnormal   Collection Time: 07/22/20 10:20 AM  Result Value Ref Range   Prothrombin Time 22.3 (H) 11.4 - 15.2 seconds   INR 2.0 (H) 0.8 - 1.2  ABO/Rh     Status: None   Collection Time: 07/22/20 10:29 AM  Result Value Ref Range   ABO/RH(D)      B POS Performed at [facility], [address], [place], [place] [address]   Comprehensive metabolic panel     Status: Abnormal   Collection Time: 07/22/20 11:14 AM  Result Value Ref Range   Sodium 141 135 - 145 mmol/L   Potassium 3.5 3.5 - 5.1 mmol/L   Chloride 102 98 - 111 mmol/L   CO2 30 22 - 32 mmol/L   Glucose, Bld 119 (H) 70 - 99 mg/dL   BUN 27 (H) 8 - 23 mg/dL   Creatinine, Ser 0.35 0.61 - 1.24 mg/dL   Calcium 9.1 8.9 - 46.5 mg/dL   Total Protein 6.8 6.5 - 8.1 g/dL   Albumin 3.5 3.5 - 5.0 g/dL   AST 14 (L) 15 - 41 U/L   ALT 14 0 - 44 U/L   Alkaline Phosphatase 37 (L) 38 - 126 U/L   Total Bilirubin 0.8 0.3 - 1.2 mg/dL   GFR, Estimated >68 >12 mL/min   Anion gap 9 5 - 15     Radiological Exams on Admission: No results found. No orders to display    Consults called:  GI     Lewie Chamber,  MD [facility] 07/22/2020, 1:39 PM

## 2020-07-22 NOTE — ED Triage Notes (Signed)
Patient BIB [facility] from home with c/o GI bleed.  Has had the same for the last 2 weeks.  Patient has internal hemorrhoids on Thursday at MD office, was given medication but has not helped.  Patient had BM this morning, was still on toilet when EMS arrived and per EMS was pouring bright red blood from rectum.  Patient is on a blood thinner. 20G left hand placed by EMS and patient received 450 ml NS on the way here.  Vitals  170/120 84-HR 16-RR 98% room air

## 2020-07-22 NOTE — ED Provider Notes (Signed)
[facility]-EMERGENCY DEPT Provider Note   CSN: [id number] Arrival date & time: 07/22/20  2536     History Chief Complaint  Patient presents with  . GI Bleeding  . Hemorrhoids    Martin [name] is a 60 y.o. male.  HPI   Patient presents to the ED for evaluation of rectal bleeding.  Patient states he has had some intermittent blood in his stool for the last couple of weeks.  Was only noticing it when he had a bowel movement.  Patient states he went to the doctor's office on Thursday.  He was told he had internal hemorrhoids.  This morning however when he went to the bathroom he noticed large amount of blood.  He continues to have blood briskly bleeding from his rectal area whenever he stands up.  Patient is on an anticoagulant.  He denies any abdominal pain.  No fevers or chills.  No vomiting.  Past Medical History:  Diagnosis Date  . Cardiomegaly    LVH  . Cataract   . Hemorrhoids   . Hyperlipidemia   . Hypertension   . Hypothyroidism   . Lower GI bleeding   . OSA (obstructive sleep apnea)    mild OSA with an AHI of 8.1/hr but severe during REM sleep with AHI 43/hr.  He had O2 desats as low as 85%.  [name] PAF (paroxysmal atrial fibrillation) (HCC)    in setting of acute cholecystitis.  No anticoagulation due to setting of acute illness and CHADS2VAS score of 1    Patient Active Problem List   Diagnosis Date Noted  . Hx of adenomatous colonic polyps 07/16/2020  . History of COVID-19 06/11/2020  . Acute pulmonary embolism (HCC) 05/27/2020  . OSA (obstructive sleep apnea)   . PAF (paroxysmal atrial fibrillation) (HCC)   . History of cholecystectomy   . History of BPH 04/03/2016  . Allergic rhinitis due to pollen 02/18/2015  . Other male erectile dysfunction 02/18/2015  . Cataract 02/15/2014  . Hemorrhoids 02/10/2013  . Hypertension 03/17/2011  . Hyperlipidemia with target LDL less than 100 03/17/2011  . LVH (left ventricular hypertrophy) due to  hypertensive disease 03/17/2011  . Hypothyroid 03/17/2011    Past Surgical History:  Procedure Laterality Date  . CHOLECYSTECTOMY N/A 06/01/2019   Procedure: LAPAROSCOPIC CHOLECYSTECTOMY;  Surgeon: Manus Rudd, MD;  Location: [facility] OR;  Service: General;  Laterality: N/A;  . DOPPLER ECHOCARDIOGRAPHY         Family History  Problem Relation Age of Onset  . Kidney disease Mother   . Hypertension Father   . Kidney disease Father   . Asthma Father     Social History   Tobacco Use  . Smoking status: Never Smoker  . Smokeless tobacco: Never Used  Substance Use Topics  . Alcohol use: Yes    Comment: Rare  . Drug use: No    Home Medications Prior to Admission medications   Medication Sig Start Date End Date Taking? Authorizing Provider  carboxymethylcellulose (REFRESH PLUS) 0.5 % SOLN Place 1 drop into both eyes daily as needed (dry eyes).   Yes [provider]  carvedilol (COREG) 25 MG tablet Take 1 tablet (25 mg total) by mouth 2 (two) times daily. 08/29/19  Yes Turner, Cornelious Bryant, MD  chlorthalidone (HYGROTON) 25 MG tablet Take 1 tablet (25 mg total) by mouth daily. 02/19/20  Yes Turner, Cornelious Bryant, MD  diltiazem (CARDIZEM CD) 300 MG 24 hr capsule Take 300 mg by mouth daily. 06/17/20  Yes [provider]  hydrocortisone (ANUSOL-HC) 25 MG suppository Place 1 suppository (25 mg total) rectally 2 (two) times daily. 07/18/20  Yes Ronnald Nian, MD  losartan (COZAAR) 50 MG tablet Take 2 tablets (100 mg total) by mouth daily. 06/24/20  Yes Turner, Cornelious Bryant, MD  Multiple Vitamin (MULTIVITAMIN) capsule Take 1 capsule by mouth daily.   Yes [provider]  pravastatin (PRAVACHOL) 40 MG tablet Take 1 tablet by mouth once daily Patient taking differently: Take 40 mg by mouth daily. 07/16/20  Yes Ronnald Nian, MD  Rivaroxaban (XARELTO) 15 MG TABS tablet Take 1 tablet (15 mg total) by mouth 2 (two) times daily with a meal. 06/28/20  Yes Tysinger, Kermit Balo, PA-C  diltiazem  (CARDIZEM CD) 360 MG 24 hr capsule Take 1 capsule (360 mg total) by mouth daily. Patient not taking: Reported on 07/22/2020 03/19/20   Tereso Newcomer T, PA-C    Allergies    Garlic and Shrimp [shellfish allergy]  Review of Systems   Review of Systems  All other systems reviewed and are negative.   Physical Exam Updated Vital Signs BP (!) 138/94 (BP Location: Left Arm)   Pulse 69   Temp 98.5 F (36.9 C) (Oral)   Resp 20   Ht 1.867 m (6' 1.5")   Wt 125 kg   SpO2 100%   BMI 35.86 kg/m   Physical Exam Vitals and nursing note reviewed.  Constitutional:      General: He is not in acute distress.    Appearance: He is well-developed and well-nourished.  HENT:     Head: Normocephalic and atraumatic.     Right Ear: External ear normal.     Left Ear: External ear normal.  Eyes:     General: No scleral icterus.       Right eye: No discharge.        Left eye: No discharge.     Conjunctiva/sclera: Conjunctivae normal.  Neck:     Trachea: No tracheal deviation.  Cardiovascular:     Rate and Rhythm: Normal rate and regular rhythm.     Pulses: Intact distal pulses.  Pulmonary:     Effort: Pulmonary effort is normal. No respiratory distress.     Breath sounds: Normal breath sounds. No stridor. No wheezing or rales.  Abdominal:     General: Bowel sounds are normal. There is no distension.     Palpations: Abdomen is soft.     Tenderness: There is no abdominal tenderness. There is no guarding or rebound.  Genitourinary:    Comments: Large external hemorrhoids noted on exam, 1 hemorrhoid does appear to be actively bleeding but difficult to completely exclude that this is blood coming from more proximally Musculoskeletal:        General: No tenderness or edema.     Cervical back: Neck supple.  Skin:    General: Skin is warm and dry.     Findings: No rash.  Neurological:     Mental Status: He is alert.     Cranial Nerves: No cranial nerve deficit (no facial droop, extraocular  movements intact, no slurred speech).     Sensory: No sensory deficit.     Motor: No abnormal muscle tone or seizure activity.     Coordination: Coordination normal.     Deep Tendon Reflexes: Strength normal.  Psychiatric:        Mood and Affect: Mood and affect normal.     ED Results / Procedures / Treatments  Labs (all labs ordered are listed, but only abnormal results are displayed) Labs Reviewed  CBC - Abnormal; Notable for the following components:      Result Value   RBC 3.01 (*)    Hemoglobin 9.5 (*)    HCT 30.1 (*)    All other components within normal limits  PROTIME-INR - Abnormal; Notable for the following components:   Prothrombin Time 22.3 (*)    INR 2.0 (*)    All other components within normal limits  COMPREHENSIVE METABOLIC PANEL - Abnormal; Notable for the following components:   Glucose, Bld 119 (*)    BUN 27 (*)    AST 14 (*)    Alkaline Phosphatase 37 (*)    All other components within normal limits  POC OCCULT BLOOD, ED  TYPE AND SCREEN  ABO/RH    EKG None  Radiology No results found.  Procedures Procedures   Medications Ordered in ED Medications - No data to display  ED Course  I have reviewed the triage vital signs and the nursing notes.  Pertinent labs & imaging results that were available during my care of the patient were reviewed by me and considered in my medical decision making (see chart for details).  Clinical Course as of 07/22/20 1238  Mon Jul 22, 2020  1140 Hemoglobin is down 1.5 points since 1 month ago. [JK]  1204 Discussed with Willette Cluster, GI.  Will come and evaluate patient. [JK]  1216 Patient's metabolic panel is normal. [JK]    Clinical Course User Index [JK] Linwood Dibbles, MD   MDM Rules/Calculators/A&P                          Patient presented to ED with lower GI bleeding.  Patient does have external hemorrhoids on exam.  It appears that he does have some bleeding from the hemorrhoid but are not 100% certain  that its not more proximal.  Patient is on Xarelto so is at risk for more severe bleeding.  He does have a drop in his hemoglobin although at this time does not require blood transfusion.  I did apply some packing material to the hemorrhoid to see if that helps.  I discussed the case with gastroenterology.  They will come see the patient.  I will consult with the medical service for admission and observation Final Clinical Impression(s) / ED Diagnoses Final diagnoses:  Lower GI bleed      Linwood Dibbles, MD 07/22/20 1239

## 2020-07-22 NOTE — Assessment & Plan Note (Signed)
-  On Coreg and Cardizem as part of rate control -Hold Xarelto for now, likely can resume at discharge pending GI work-up

## 2020-07-22 NOTE — Assessment & Plan Note (Signed)
-  No home medications seen on med rec -TSH relatively stable from trend -Repeat TSH now

## 2020-07-22 NOTE — Hospital Course (Signed)
Martin [name] is a 60 yo AA male with PMH polyps (last CLN 2021), PAF (on Xarelto), resistant HTN, HLD, OSA, hypothyroidism who presented to the ER due to rectal bleeding at home. His symptoms started on 07/17/2019.  He was seen at his primary care office and was ordered Anusol suppositories due to suspicion for internal hemorrhoid bleeding however due to financial constraints did not start taking until about 07/19/20.  He has had ongoing bleeding but his wife also states that the suppository has not stayed in long. In the ER he was noted to have ongoing rectal bleeding.  This was packed with gauze temporarily and GI was consulted. Patient last took his Xarelto on the evening of 07/21/2020.  Hemoglobin was 9.5 g/dL on evaluation with typical baseline ~11-12 g/dL. He is admitted for observation and further evaluation by GI.

## 2020-07-22 NOTE — Assessment & Plan Note (Addendum)
-   likely due to [facility] but also appears to have chronic component  - baseline Hgb 11-12 g/dL. Hgb on admission 9.5 - see rectal bleeding - check B12, folate

## 2020-07-22 NOTE — ED Notes (Signed)
GI at bedside

## 2020-07-23 ENCOUNTER — Telehealth: Payer: Self-pay

## 2020-07-23 ENCOUNTER — Encounter (HOSPITAL_COMMUNITY): Payer: Self-pay | Admitting: Internal Medicine

## 2020-07-23 ENCOUNTER — Other Ambulatory Visit: Payer: Self-pay | Admitting: Family Medicine

## 2020-07-23 DIAGNOSIS — D539 Nutritional anemia, unspecified: Secondary | ICD-10-CM | POA: Diagnosis not present

## 2020-07-23 DIAGNOSIS — K625 Hemorrhage of anus and rectum: Secondary | ICD-10-CM | POA: Diagnosis not present

## 2020-07-23 DIAGNOSIS — E785 Hyperlipidemia, unspecified: Secondary | ICD-10-CM | POA: Diagnosis not present

## 2020-07-23 DIAGNOSIS — E039 Hypothyroidism, unspecified: Secondary | ICD-10-CM | POA: Diagnosis not present

## 2020-07-23 LAB — SARS CORONAVIRUS 2 (TAT 6-24 HRS): SARS Coronavirus 2: NEGATIVE

## 2020-07-23 LAB — BASIC METABOLIC PANEL
Anion gap: 10 (ref 5–15)
BUN: 17 mg/dL (ref 8–23)
CO2: 27 mmol/L (ref 22–32)
Calcium: 8.8 mg/dL — ABNORMAL LOW (ref 8.9–10.3)
Chloride: 103 mmol/L (ref 98–111)
Creatinine, Ser: 1.19 mg/dL (ref 0.61–1.24)
GFR, Estimated: >60 mL/min (ref >60)
Glucose, Bld: 122 mg/dL — ABNORMAL HIGH (ref 70–99)
Potassium: 3.3 mmol/L — ABNORMAL LOW (ref 3.5–5.1)
Sodium: 140 mmol/L (ref 135–145)

## 2020-07-23 LAB — CBC
HCT: 27.1 % — ABNORMAL LOW (ref 39.0–52.0)
HCT: 27.3 % — ABNORMAL LOW (ref 39.0–52.0)
Hemoglobin: 8.6 g/dL — ABNORMAL LOW (ref 13.0–17.0)
Hemoglobin: 8.8 g/dL — ABNORMAL LOW (ref 13.0–17.0)
MCH: 31.5 pg (ref 26.0–34.0)
MCH: 31.4 pg (ref 26.0–34.0)
MCHC: 31.7 g/dL (ref 30.0–36.0)
MCHC: 32.2 g/dL (ref 30.0–36.0)
MCV: 99.3 fL (ref 80.0–100.0)
MCV: 97.5 fL (ref 80.0–100.0)
Platelets: 225 10*3/uL (ref 150–400)
Platelets: 235 10*3/uL (ref 150–400)
RBC: 2.73 MIL/uL — ABNORMAL LOW (ref 4.22–5.81)
RBC: 2.8 MIL/uL — ABNORMAL LOW (ref 4.22–5.81)
RDW: 13.2 % (ref 11.5–15.5)
RDW: 13.1 % (ref 11.5–15.5)
WBC: 6.5 10*3/uL (ref 4.0–10.5)
WBC: 6.5 10*3/uL (ref 4.0–10.5)
nRBC: 0 % (ref 0.0–0.2)
nRBC: 0 % (ref 0.0–0.2)

## 2020-07-23 LAB — MAGNESIUM: Magnesium: 2 mg/dL (ref 1.7–2.4)

## 2020-07-23 MED ORDER — POLYETHYLENE GLYCOL 3350 17 G PO PACK: 17.0000 g | PACK | Freq: Every day | ORAL | 0 refills | Status: DC

## 2020-07-23 MED ORDER — RIVAROXABAN 20 MG PO TABS: 20.0000 mg | ORAL_TABLET | Freq: Every day | ORAL | 1 refills | Status: DC

## 2020-07-23 MED ORDER — DOCUSATE SODIUM 100 MG PO CAPS
100.0000 mg | ORAL_CAPSULE | Freq: Two times a day (BID) | ORAL | Status: DC
  Filled 2020-07-23: qty 1

## 2020-07-23 MED ORDER — DOCUSATE SODIUM 100 MG PO CAPS: 100.0000 mg | ORAL_CAPSULE | Freq: Two times a day (BID) | ORAL | 0 refills | Status: DC

## 2020-07-23 MED ORDER — HYDROCORT-PRAMOXINE (PERIANAL) 1-1 % EX FOAM
1.0000 | Freq: Three times a day (TID) | CUTANEOUS | Status: DC
  Filled 2020-07-23: qty 10

## 2020-07-23 MED ORDER — HYDROCORT-PRAMOXINE (PERIANAL) 1-1 % EX FOAM: 1.0000 | Freq: Three times a day (TID) | CUTANEOUS | 1 refills | Status: DC

## 2020-07-23 MED ORDER — POLYETHYLENE GLYCOL 3350 17 G PO PACK
17.0000 g | PACK | Freq: Every day | ORAL | Status: DC
  Administered 2020-07-23: 17 g via ORAL
  Filled 2020-07-23: qty 1

## 2020-07-23 NOTE — Discharge Summary (Signed)
Physician Discharge Summary  [facility] OXB:[id number] DOB: 1961-04-24 DOA: 07/22/2020  PCP: Ronnald Nian, MD  Admit date: 07/22/2020 Discharge date: 07/23/2020  Admitted From: Home  Discharge disposition: Home   Recommendations for Outpatient Follow-Up:   . Follow up with your primary care provider in one week.  . Check CBC, BMP, magnesium in the next visit . Follow-up with the [facility]   Discharge Diagnosis:   Principal Problem:   Rectal bleeding Active Problems:   Resistant hypertension   Hyperlipidemia with target LDL less than 100   Hypothyroid   PAF (paroxysmal atrial fibrillation) (HCC)   Macrocytic anemia  Discharge Condition: Improved.  Diet recommendation: Low sodium, heart healthy.    Wound care: None.  Code status: Full.   History of Present Illness:   [name] is a 60 yo AA male with PMH polyps (last colonscopy 2021), PAF (on Xarelto), resistant HTN, HLD, OSA, hypothyroidism who presented to the ER due to rectal bleeding at home. His symptoms started on 07/17/2019. He was seen at his primary care office and was ordered Anusol suppositories due to suspicion for internal hemorrhoid bleeding however due to financial constraints did not start taking until about 07/19/20.  In the ER, he was noted to have ongoing rectal bleeding. This was packed with gauze temporarily and GI was consulted. Patient last took his Xarelto on the evening of 07/21/2020. Hemoglobin was 9.5 g/dL on evaluation with typical baseline ~11-12 g/dL.  Patient was then admitted for observation and further evaluation by GI.   Hospital Course:   Following conditions were addressed during hospitalization as listed below,  Rectal bleeding Patient was seen by GI and general surgery.  Patient was noted to have  hemorrhoidal bleeding.  Surgery recommended Proctofoam including sitz bath.  Did not have further bleeding.  Patient will be prescribed stool softeners as well.   General surgery to follow the patient as outpatient.  Patient was on Xarelto which has been discontinued at this time.  Patient with CHA2DS2-VASc score of 1.  He does not have clear-cut history of DVT or PE.  At this time risk of bleeding is higher than benefit.  He will be discontinued on Xarelto for at least 1 week.  If he has a strong indication for anticoagulation he could be resumed as outpatient.  Acute blood loss anemia secondary to rectal bleed. Stable at this time.  Hypertension Needs outpatient monitoring and follow-up.  Continue Coreg chlorthalidone diltiazem losartan.  PAF (paroxysmal atrial fibrillation) (HCC) -On Coreg and Cardizem . -Hold Xarelto for now.  CHA2DS2-VASc score of 1 at this time  Hypothyroid By history.  TSH within normal range.  Not on medications.  Hyperlipidemia  Resume statin on discharge  Disposition.  At this time, patient is stable for disposition home.  Will need to follow-up with primary care in 1 to 2 weeks and with general surgery in 3 weeks.  Medical Consultants:    GI  General surgery  Procedures:    None Subjective:   Today, patient is well.  Denies any rectal bleeding.  No nausea vomiting fever dizziness lightheadedness  Discharge Exam:   Vitals:   07/23/20 1118 07/23/20 1130  BP: 129/82 112/90  Pulse: 75 76  Resp: 18 (!) 22  Temp: 98.2 F (36.8 C)   SpO2: 100% 100%   Vitals:   07/23/20 0700 07/23/20 0801 07/23/20 1118 07/23/20 1130  BP: (!) 148/101 (!) 106/47 129/82 112/90  Pulse: 65 67 75 76  Resp:  17 (!) 21 18 (!) 22  Temp:   98.2 F (36.8 C)   TempSrc:   Oral   SpO2: 98% 100% 100% 100%  Weight:      Height:       General: Alert awake, not in obvious distress HENT: pupils equally reacting to light,  No scleral pallor or icterus noted. Oral mucosa is moist.  Chest:  Clear breath sounds.  Diminished breath sounds bilaterally. No crackles or wheezes.  CVS: S1 &S2 heard. No murmur.  Regular rate and  rhythm. Abdomen: Soft, nontender, nondistended.  Bowel sounds are heard.  External hemorrhoids as per general surgery. Extremities: No cyanosis, clubbing or edema.  Peripheral pulses are palpable. Psych: Alert, awake and oriented, normal mood CNS:  No cranial nerve deficits.  Power equal in all extremities.   Skin: Warm and dry.  No rashes noted.  The results of significant diagnostics from this hospitalization (including imaging, microbiology, ancillary and laboratory) are listed below for reference.     Diagnostic Studies:   No results found.   Labs:   Basic Metabolic Panel: Recent Labs  Lab 07/22/20 1114 07/23/20 0452  NA 141 140  K 3.5 3.3*  CL 102 103  CO2 30 27  GLUCOSE 119* 122*  BUN 27* 17  CREATININE 1.20 1.19  CALCIUM 9.1 8.8*  MG  --  2.0   GFR Estimated Creatinine Clearance: 89.9 mL/min (by C-G formula based on SCr of 1.19 mg/dL). Liver Function Tests: Recent Labs  Lab 07/22/20 1114  AST 14*  ALT 14  ALKPHOS 37*  BILITOT 0.8  PROT 6.8  ALBUMIN 3.5   No results for input(s): LIPASE, AMYLASE in the last 168 hours. No results for input(s): AMMONIA in the last 168 hours. Coagulation profile Recent Labs  Lab 07/22/20 1020  INR 2.0*    CBC: Recent Labs  Lab 07/22/20 1020 07/22/20 1722 07/23/20 0100 07/23/20 0452  WBC 5.6 6.4 6.5 6.5  HGB 9.5* 9.1* 8.6* 8.8*  HCT 30.1* 28.6* 27.1* 27.3*  MCV 100.0 98.3 99.3 97.5  PLT 223 218 225 235   Cardiac Enzymes: No results for input(s): CKTOTAL, CKMB, CKMBINDEX, TROPONINI in the last 168 hours. BNP: Invalid input(s): POCBNP CBG: No results for input(s): GLUCAP in the last 168 hours. D-Dimer No results for input(s): DDIMER in the last 72 hours. Hgb A1c No results for input(s): HGBA1C in the last 72 hours. Lipid Profile No results for input(s): CHOL, HDL, LDLCALC, TRIG, CHOLHDL, LDLDIRECT in the last 72 hours. Thyroid function studies Recent Labs    07/22/20 1335  TSH 3.223   Anemia work  up Recent Labs    07/22/20 1335  VITAMINB12 465  FOLATE 11.5   Microbiology Recent Results (from the past 240 hour(s))  SARS CORONAVIRUS 2 (TAT 6-24 HRS) Nasopharyngeal Nasopharyngeal Swab     Status: None   Collection Time: 07/22/20 12:55 PM   Specimen: Nasopharyngeal Swab  Result Value Ref Range Status   SARS Coronavirus 2 NEGATIVE NEGATIVE Final    Comment: (NOTE) SARS-CoV-2 target nucleic acids are NOT DETECTED.  The SARS-CoV-2 RNA is generally detectable in upper and lower respiratory specimens during the acute phase of infection. Negative results do not preclude SARS-CoV-2 infection, do not rule out co-infections with other pathogens, and should not be used as the sole basis for treatment or other patient management decisions. Negative results must be combined with clinical observations, patient history, and epidemiological information. The expected result is Negative.  Fact Sheet for Patients: HairSlick.no  Fact Sheet for Healthcare Providers: quierodirigir.com  This test is not yet approved or cleared by the [place] FDA and  has been authorized for detection and/or diagnosis of SARS-CoV-2 by FDA under an Emergency Use Authorization (EUA). This EUA will remain  in effect (meaning this test can be used) for the duration of the COVID-19 declaration under Se ction 564(b)(1) of the Act, 21 U.S.C. section 360bbb-3(b)(1), unless the authorization is terminated or revoked sooner.  Performed at [facility], [address], [place], [place] [address]      Discharge Instructions:   Discharge Instructions    Diet - low sodium heart healthy   Complete by: As directed    Discharge instructions   Complete by: As directed    Follow up with your primary care provider  in 1-2 weeks.  Check blood work at that time.  Follow-up with general surgery in 3 weeks to check on  hemorrhoids.  Follow instructions as per  surgery including sitz bathes every 4-6 hourly.  Take medications including stool softeners as prescribed.  Seek medical attention for worsening symptoms.   Increase activity slowly   Complete by: As directed      Allergies as of 07/23/2020      Reactions   Garlic Swelling   Shrimp [shellfish Allergy] Swelling      Medication List    STOP taking these medications   hydrocortisone 25 MG suppository Commonly known as: ANUSOL-HC   Rivaroxaban 15 MG Tabs tablet Commonly known as: XARELTO     TAKE these medications   carboxymethylcellulose 0.5 % Soln Commonly known as: REFRESH PLUS Place 1 drop into both eyes daily as needed (dry eyes).   carvedilol 25 MG tablet Commonly known as: COREG Take 1 tablet (25 mg total) by mouth 2 (two) times daily.   chlorthalidone 25 MG tablet Commonly known as: HYGROTON Take 1 tablet (25 mg total) by mouth daily.   diltiazem 300 MG 24 hr capsule Commonly known as: CARDIZEM CD Take 300 mg by mouth daily.   docusate sodium 100 MG capsule Commonly known as: COLACE Take 1 capsule (100 mg total) by mouth 2 (two) times daily.   hydrocortisone-pramoxine rectal foam Commonly known as: PROCTOFOAM-HC Place 1 applicator rectally 3 (three) times daily.   losartan 50 MG tablet Commonly known as: COZAAR Take 2 tablets (100 mg total) by mouth daily.   multivitamin capsule Take 1 capsule by mouth daily.   polyethylene glycol 17 g packet Commonly known as: MIRALAX / GLYCOLAX Take 17 g by mouth daily. Start taking on: July 24, 2020   pravastatin 40 MG tablet Commonly known as: PRAVACHOL Take 1 tablet by mouth once daily       Follow-up Information    Sur[facility], [facility] Follow up in 3 week(s).   Specialty: General Surgery Why: Call to schedule an appointment to see a colorectal specialist for your hemorrhoids Contact information: [address] [place] [address] [phone]        Ronnald Nian, MD. Schedule an  appointment as soon as possible for a visit in 1 week(s).   Specialty: Family Medicine Why: regular checkup, blood work Contact information: [address] [place] [address] [phone]        Quintella Reichert, MD .   Specialty: Cardiology Contact information: [address] [place] [address] [phone]                Time  coordinating discharge: 39 minutes  Signed:  Diamantina Edinger  [facility] 07/23/2020, 3:52 PM

## 2020-07-23 NOTE — Telephone Encounter (Signed)
Pt wife was advised that his xarelto until cleared by Careers adviser. KH

## 2020-07-23 NOTE — Telephone Encounter (Signed)
Pt was advised to hold xarelto. KH

## 2020-07-23 NOTE — ED Notes (Signed)
Went over [employer], follow-up care, rx, otc meds and sitz bath with patient- pt understood, no questions,ready for dc

## 2020-07-23 NOTE — Discharge Instructions (Signed)
How to Take a [employer] A sitz bath is a warm water bath that may be used to care for your rectum, genital area, or the area between your rectum and genitals (perineum). In a sitz bath, the water only comes up to your hips and covers your buttocks. A sitz bath may be done in a bathtub or with a portable sitz bath that fits over the toilet. Your health care provider may recommend a sitz bath to help:  Relieve pain and discomfort after delivering a baby.  Relieve pain and itching from hemorrhoids or anal fissures.  Relieve pain after certain surgeries.  Relax muscles that are sore or tight. How to take a sitz bath Take 3-4 sitz baths a day, or as many as told by your health care provider. Bathtub sitz bath To take a sitz bath in a bathtub: 1. Partially fill a bathtub with warm water. The water should be deep enough to cover your hips and buttocks when you are sitting in the tub. 2. Follow your health care provider's instructions if you are told to put medicine in the water. 3. Sit in the water. Open the tub drain a little, and leave it open during your bath. 4. Turn on the warm water again, enough to replace the water that is draining out. Keep the water running throughout your bath. This helps keep the water at the right level and temperature. 5. Soak in the water for 15-20 minutes, or as long as told by your health care provider. 6. When you are done, be careful when you stand up. You may feel dizzy. 7. After the sitz bath, pat yourself dry. Do not rub your skin to dry it.   Over-the-toilet sitz bath To take a sitz bath with an over-the-toilet basin: 1. Follow the manufacturer's instructions. 2. Fill the basin with warm water. 3. Follow your health care provider's instructions if you were told to put medicine in the water. 4. Sit on the seat. Make sure the water covers your buttocks and perineum. 5. Soak in the water for 15-20 minutes, or as long as told by your health care  provider. 6. After the sitz bath, pat yourself dry. Do not rub your skin to dry it. 7. Clean and dry the basin between uses. 8. Discard the basin if it cracks, or according to the manufacturer's instructions.   Contact a health care provider if:  Your pain or itching gets worse. Do not continue with sitz baths if your symptoms get worse.  You have new symptoms. Do not continue with sitz baths until you talk with your health care provider. Summary  A sitz bath is a warm water bath in which the water only comes up to your hips and covers your buttocks.  A sitz bath may help relieve pain and discomfort after delivering a baby. It also may help with pain and itching from hemorrhoids or anal fissures, or pain after certain surgeries. It can also help to relax muscles that are sore or tight.  Take 3-4 sitz baths a day, or as many as told by your health care provider. Soak in the water for 15-20 minutes.  Do not continue with sitz baths if your symptoms get worse. This information is not intended to replace advice given to you by your health care provider. Make sure you discuss any questions you have with your health care provider. Document Revised: 02/22/2020 Document Reviewed: 02/22/2020 Elsevier Patient Education  2021 Elsevier Inc.   Hemorrhoids Hemorrhoids  are swollen veins that may develop:  In the butt (rectum). These are called internal hemorrhoids.  Around the opening of the butt (anus). These are called external hemorrhoids. Hemorrhoids can cause pain, itching, or bleeding. Most of the time, they do not cause serious problems. They usually get better with diet changes, lifestyle changes, and other home treatments. What are the causes? This condition may be caused by:  Having trouble pooping (constipation).  Pushing hard (straining) to poop.  Watery poop (diarrhea).  Pregnancy.  Being very overweight (obese).  Sitting for long periods of time.  Heavy lifting or other  activity that causes you to strain.  Anal sex.  Riding a bike for a long period of time. What are the signs or symptoms? Symptoms of this condition include:  Pain.  Itching or soreness in the butt.  Bleeding from the butt.  Leaking poop.  Swelling in the area.  One or more lumps around the opening of your butt. How is this diagnosed? A doctor can often diagnose this condition by looking at the affected area. The doctor may also:  Do an exam that involves feeling the area with a gloved hand (digital rectal exam).  Examine the area inside your butt using a small tube (anoscope).  Order blood tests. This may be done if you have lost a lot of blood.  Have you get a test that involves looking inside the colon using a flexible tube with a camera on the end (sigmoidoscopy or colonoscopy). How is this treated? This condition can usually be treated at home. Your doctor may tell you to change what you eat, make lifestyle changes, or try home treatments. If these do not help, procedures can be done to remove the hemorrhoids or make them smaller. These may involve:  Placing rubber bands at the base of the hemorrhoids to cut off their blood supply.  Injecting medicine into the hemorrhoids to shrink them.  Shining a type of light energy onto the hemorrhoids to cause them to fall off.  Doing surgery to remove the hemorrhoids or cut off their blood supply. Follow these instructions at home: Eating and drinking  Eat foods that have a lot of fiber in them. These include whole grains, beans, nuts, fruits, and vegetables.  Ask your doctor about taking products that have added fiber (fibersupplements).  Reduce the amount of fat in your diet. You can do this by: ? Eating low-fat dairy products. ? Eating less red meat. ? Avoiding processed foods.  Drink enough fluid to keep your pee (urine) pale yellow.   Managing pain and swelling  Take a warm-water bath (sitz bath) for 20 minutes to  ease pain. Do this 3-4 times a day. You may do this in a bathtub or using a portable sitz bath that fits over the toilet.  If told, put ice on the painful area. It may be helpful to use ice between your warm baths. ? Put ice in a plastic bag. ? Place a towel between your skin and the bag. ? Leave the ice on for 20 minutes, 2-3 times a day.   General instructions  Take over-the-counter and prescription medicines only as told by your doctor. ? Medicated creams and medicines may be used as told.  Exercise often. Ask your doctor how much and what kind of exercise is best for you.  Go to the bathroom when you have the urge to poop. Do not wait.  Avoid pushing too hard when you poop.  Keep  your butt dry and clean. Use wet toilet paper or moist towelettes after pooping.  Do not sit on the toilet for a long time.  Keep all follow-up visits as told by your doctor. This is important. Contact a doctor if you:  Have pain and swelling that do not get better with treatment or medicine.  Have trouble pooping.  Cannot poop.  Have pain or swelling outside the area of the hemorrhoids. Get help right away if you have:  Bleeding that will not stop. Summary  Hemorrhoids are swollen veins in the butt or around the opening of the butt.  They can cause pain, itching, or bleeding.  Eat foods that have a lot of fiber in them. These include whole grains, beans, nuts, fruits, and vegetables.  Take a warm-water bath (sitz bath) for 20 minutes to ease pain. Do this 3-4 times a day. This information is not intended to replace advice given to you by your health care provider. Make sure you discuss any questions you have with your health care provider. Document Revised: 06/16/2018 Document Reviewed: 10/28/2017 Elsevier Patient Education  2021 [employer].

## 2020-07-23 NOTE — Progress Notes (Signed)
Subjective: Patient states he feels much better this morning.  No further bleeding since yesterday.  Thinks gelfoam is still in place.    ROS: See above, otherwise other systems negative  Objective: Vital signs in last 24 hours: Temp:  [98.5 F (36.9 C)] 98.5 F (36.9 C) (01/31 1025) Pulse Rate:  [65-85] 65 (02/01 0700) Resp:  [15-30] 17 (02/01 0700) BP: (113-197)/(67-113) 148/101 (02/01 0700) SpO2:  [96 %-100 %] 98 % (02/01 0700) Weight:  [782 kg] 125 kg (01/31 1015)    Intake/Output from previous day: 01/31 0701 - 02/01 0700 In: 663.6 [I.V.:663.6] Out: 200 [Urine:200] Intake/Output this shift: No intake/output data recorded.  PE: Rectal: internal hemorrhoids have been reduced, only external hemorrhoid/large skin tags visible.  No further bleeding identified.  Gelfoam is miniscule and has fallen out.    Lab Results:  Recent Labs    07/23/20 0100 07/23/20 0452  WBC 6.5 6.5  HGB 8.6* 8.8*  HCT 27.1* 27.3*  PLT 225 235   BMET Recent Labs    07/22/20 1114 07/23/20 0452  NA 141 140  K 3.5 3.3*  CL 102 103  CO2 30 27  GLUCOSE 119* 122*  BUN 27* 17  CREATININE 1.20 1.19  CALCIUM 9.1 8.8*   PT/INR Recent Labs    07/22/20 1020  LABPROT 22.3*  INR 2.0*   CMP     Component Value Date/Time   NA 140 07/23/2020 0452   NA 144 06/11/2020 1431   K 3.3 (L) 07/23/2020 0452   CL 103 07/23/2020 0452   CO2 27 07/23/2020 0452   GLUCOSE 122 (H) 07/23/2020 0452   BUN 17 07/23/2020 0452   BUN 13 06/11/2020 1431   CREATININE 1.19 07/23/2020 0452   CREATININE 1.18 04/03/2016 0754   CALCIUM 8.8 (L) 07/23/2020 0452   PROT 6.8 07/22/2020 1114   PROT 6.7 06/11/2020 1431   ALBUMIN 3.5 07/22/2020 1114   ALBUMIN 3.8 06/11/2020 1431   AST 14 (L) 07/22/2020 1114   ALT 14 07/22/2020 1114   ALKPHOS 37 (L) 07/22/2020 1114   BILITOT 0.8 07/22/2020 1114   BILITOT 0.9 06/11/2020 1431   GFRNONAA >60 07/23/2020 0452   GFRAA 69 06/11/2020 1431   Lipase     Component  Value Date/Time   LIPASE 44 06/01/2019 0417       Studies/Results: No results found.  Anti-infectives: Anti-infectives (From admission, onward)   None       Assessment/Plan HTN HLD OSA Recent covid-19 infection 05/2020 Possible PE 05/2020  PAF - on xarelto - last dose 1/30 Acute on chronic anemia - Hgb 9.5 (baseline 11-12), hgb stable this am  Bleeding hemorrhoids  - Gel foam has ceased bleeding -internal hemorrhoids have been reduced.   -we discussed how to reduce his internal hemorrhoids on his own at home -sitz bathes q 4 hrs -proctofoam TID to start today -patient states he had a DVT/PE in December, however in review, he did not have a DVT and his CTA was not definitive for a PE.  Since he is on xarelto for PAF essentially, Dr. Tyson Babinski and I discussed holding his Xarelto for a week and then restarting it.  This should allow his medicines to settle his hemorrhoids enough that restarting his blood thinners will hopefully not cause as much bleeding issues. -we will have him follow up with our colorectal specialist for hemorrhoid follow up in a couple of weeks. -Ideally would like to avoid surgical intervention at this time  and get him better without intervention  ID - none VTE - SCDs only for now, hold xarelto FEN - HH diet Foley - none Follow up - [facility]    LOS: 0 days    Letha Cape , [facility] 07/23/2020, 8:12 AM Please see Amion for pager number during day hours 7:00am-4:30pm or 7:00am -11:30am on weekends

## 2020-07-24 ENCOUNTER — Telehealth: Payer: Self-pay

## 2020-07-24 NOTE — Telephone Encounter (Signed)
Pt. Called per TOC report and scheduled for a hospital f/u on 07/30/20. Medications were gone over and reconciled.

## 2020-07-25 ENCOUNTER — Telehealth: Payer: Self-pay | Admitting: *Deleted

## 2020-07-25 NOTE — Telephone Encounter (Signed)
TOC CM received call from pt stating his Rx was not escribed to his [facility]. TOC CM contacted [facility] and Rx is waiting. CM made pt aware. Isidoro Donning RN CCM, [facility] TOC CM [phone]

## 2020-07-30 ENCOUNTER — Inpatient Hospital Stay: Payer: BC Managed Care – PPO | Admitting: Family Medicine

## 2020-08-06 ENCOUNTER — Other Ambulatory Visit: Payer: Self-pay

## 2020-08-06 ENCOUNTER — Encounter: Payer: Self-pay | Admitting: Family Medicine

## 2020-08-06 ENCOUNTER — Ambulatory Visit: Payer: BC Managed Care – PPO | Admitting: Family Medicine

## 2020-08-06 VITALS — BP 134/76 | HR 76 | Temp 97.3°F | Wt 273.8 lb

## 2020-08-06 DIAGNOSIS — Z8679 Personal history of other diseases of the circulatory system: Secondary | ICD-10-CM

## 2020-08-06 DIAGNOSIS — K648 Other hemorrhoids: Secondary | ICD-10-CM | POA: Diagnosis not present

## 2020-08-06 DIAGNOSIS — Z7901 Long term (current) use of anticoagulants: Secondary | ICD-10-CM

## 2020-08-06 NOTE — Progress Notes (Signed)
   Subjective:    [name], male    DOB: [birth year], 60 y.o.   MRN: [id number]  HPI He is here for a recheck after recent hospital visit for treatment of rectal bleeding.  He was seen on January 25 and Anusol was recommended however on 31st his bleeding continued and he went to the emergency room.  The hemoglobin at that time was 9.5 down from 11.0.  He was treated conservatively with Proctofoam with adequate hemostasis.  His Xarelto was held.  He was given that for treatment of PAF.  Since that time he has had several episodes of seeing rectal bleeding stating at least 3 of them in the last week with BMs.  He continues on Proctofoam.  He is scheduled to see general surgery in about 2 weeks.   Review of Systems     Objective:   Physical Exam Alert and in no distress otherwise not examined The medical record including emergency room and hospital visit was reviewed occurring blood work and notes.      Assessment & Plan:  Bleeding internal hemorrhoids - Plan: Magnesium, CBC with Differential/Platelet, Comprehensive metabolic panel  History of atrial fibrillation - Plan: CBC with Differential/Platelet, Comprehensive metabolic panel  Chronic anticoagulation - Plan: Magnesium, CBC with Differential/Platelet, Comprehensive metabolic panel I will do follow-up blood work on him.  I will work on getting him into see general surgery sooner as I am reluctant to start him on his Xarelto again until we get the bleeding under better control but do not feel comfortable waiting too long.

## 2020-08-07 LAB — COMPREHENSIVE METABOLIC PANEL
ALT: 9 IU/L (ref 0–44)
AST: 11 IU/L (ref 0–40)
Albumin/Globulin Ratio: 1.6 (ref 1.2–2.2)
Albumin: 4.2 g/dL (ref 3.8–4.8)
Alkaline Phosphatase: 51 IU/L (ref 44–121)
BUN/Creatinine Ratio: 13 (ref 10–24)
BUN: 19 mg/dL (ref 8–27)
Bilirubin Total: 0.6 mg/dL (ref 0.0–1.2)
CO2: 24 mmol/L (ref 20–29)
Calcium: 9.6 mg/dL (ref 8.6–10.2)
Chloride: 102 mmol/L (ref 96–106)
Creatinine, Ser: 1.43 mg/dL — ABNORMAL HIGH (ref 0.76–1.27)
GFR calc Af Amer: 60 mL/min/{1.73_m2} (ref >59)
GFR calc non Af Amer: 52 mL/min/{1.73_m2} — ABNORMAL LOW (ref >59)
Globulin, Total: 2.7 g/dL (ref 1.5–4.5)
Glucose: 145 mg/dL — ABNORMAL HIGH (ref 65–99)
Potassium: 4 mmol/L (ref 3.5–5.2)
Sodium: 142 mmol/L (ref 134–144)
Total Protein: 6.9 g/dL (ref 6.0–8.5)

## 2020-08-07 LAB — MAGNESIUM: Magnesium: 1.9 mg/dL (ref 1.6–2.3)

## 2020-08-07 LAB — CBC WITH DIFFERENTIAL/PLATELET
Basophils Absolute: 0.1 10*3/uL (ref 0.0–0.2)
Basos: 1 %
EOS (ABSOLUTE): 0.2 10*3/uL (ref 0.0–0.4)
Eos: 3 %
Hematocrit: 28.5 % — ABNORMAL LOW (ref 37.5–51.0)
Hemoglobin: 8.9 g/dL — ABNORMAL LOW (ref 13.0–17.7)
Immature Grans (Abs): 0 10*3/uL (ref 0.0–0.1)
Immature Granulocytes: 0 %
Lymphocytes Absolute: 1.8 10*3/uL (ref 0.7–3.1)
Lymphs: 32 %
MCH: 29.6 pg (ref 26.6–33.0)
MCHC: 31.2 g/dL — ABNORMAL LOW (ref 31.5–35.7)
MCV: 95 fL (ref 79–97)
Monocytes Absolute: 0.6 10*3/uL (ref 0.1–0.9)
Monocytes: 10 %
Neutrophils Absolute: 3 10*3/uL (ref 1.4–7.0)
Neutrophils: 54 %
Platelets: 298 10*3/uL (ref 150–450)
RBC: 3.01 x10E6/uL — ABNORMAL LOW (ref 4.14–5.80)
RDW: 13 % (ref 11.6–15.4)
WBC: 5.6 10*3/uL (ref 3.4–10.8)

## 2020-08-14 ENCOUNTER — Encounter: Payer: Self-pay | Admitting: Family Medicine

## 2020-08-14 ENCOUNTER — Other Ambulatory Visit: Payer: Self-pay

## 2020-08-14 ENCOUNTER — Ambulatory Visit: Payer: BC Managed Care – PPO | Admitting: Family Medicine

## 2020-08-14 VITALS — BP 138/90 | HR 66 | Temp 97.9°F | Wt 276.0 lb

## 2020-08-14 DIAGNOSIS — Z7901 Long term (current) use of anticoagulants: Secondary | ICD-10-CM | POA: Diagnosis not present

## 2020-08-14 DIAGNOSIS — K648 Other hemorrhoids: Secondary | ICD-10-CM

## 2020-08-14 DIAGNOSIS — Z23 Encounter for immunization: Secondary | ICD-10-CM | POA: Diagnosis not present

## 2020-08-14 NOTE — Progress Notes (Signed)
   Subjective:    [name], male    DOB: Mar 19, 1961, 60 y.o.   MRN: [id number]  HPI He is here for recheck.  He was recently seen by general surgery.  An email was sent to me concerning this.  At this point no surgery is pending and hopefully things will quiet down.  He has a follow-up appointment in 1 month.  He has not had any blood rectal bleeding in over a week.  Surgeon worked with him concerning better bowel habits and having softer BMs. He also needs his follow-up Covid vaccine. Review of Systems     Objective:   Physical Exam Alert and in no distress.  Fingerstick hemoglobin was 10.7.       Assessment & Plan:  Chronic anticoagulation  Need for COVID-19 vaccine - Plan: Moderna SARS-CoV-2 Vaccine  Bleeding internal hemorrhoids He will start taking his Xarelto.  Follow-up here in roughly 3 months which will be a 44-month time from his pulmonary embolus. Discussed the rectal bleeding with him and again reinforced the need for him to work on having softer BMs.  Encouraged that when he has the urge to have a BM to go ahead and have it rather than postpone it to help keep him from becoming constipated.

## 2020-08-20 ENCOUNTER — Other Ambulatory Visit: Payer: Self-pay | Admitting: Cardiology

## 2020-08-29 ENCOUNTER — Other Ambulatory Visit: Payer: Self-pay

## 2020-08-29 ENCOUNTER — Ambulatory Visit (HOSPITAL_BASED_OUTPATIENT_CLINIC_OR_DEPARTMENT_OTHER): Payer: BC Managed Care – PPO | Attending: Cardiology | Admitting: Cardiology

## 2020-08-29 DIAGNOSIS — G4733 Obstructive sleep apnea (adult) (pediatric): Secondary | ICD-10-CM

## 2020-08-29 DIAGNOSIS — R0683 Snoring: Secondary | ICD-10-CM | POA: Diagnosis not present

## 2020-09-03 NOTE — Procedures (Signed)
   Patient Name: Martin [name] Date: 08/29/2020 Gender: Male D.O.B: February 11, 1961 Age (years): 78 Referring Provider: Armanda Magic MD, ABSM Height (inches): 72 Interpreting Physician: Armanda Magic MD, ABSM Weight (lbs): 260 RPSGT: Armen Pickup BMI: 35 MRN: [id number] Neck Size: 18.50  CLINICAL INFORMATION Sleep Study Type: NPSG  Indication for sleep study: OSA, Snoring  Epworth Sleepiness Score: 4  Most recent polysomnogram dated 09/17/2019 revealed an AHI of 8.1/h. Most recent titration study dated 11/14/2019 was optimal at 12cm H2O with an AHI of 0.0/h.  SLEEP STUDY TECHNIQUE As per the AASM Manual for the Scoring of Sleep and Associated Events v2.3 (April 2016) with a hypopnea requiring 4% desaturations.  The channels recorded and monitored were frontal, central and occipital EEG, electrooculogram (EOG), submentalis EMG (chin), nasal and oral airflow, thoracic and abdominal wall motion, anterior tibialis EMG, snore microphone, electrocardiogram, and pulse oximetry.  MEDICATIONS Medications self-administered by patient taken the night of the study : N/A  SLEEP ARCHITECTURE The study was initiated at 10:20:47 PM and ended at 4:15:02 AM.  Sleep onset time was 96.5 minutes and the sleep efficiency was 35.0%. The total sleep time was 124 minutes.  Stage REM latency was 89.5 minutes.  The patient spent 24.2% of the night in stage N1 sleep, 72.6% in stage N2 sleep, 0.0% in stage N3 and 3.2% in REM.  Alpha intrusion was absent.  Supine sleep was 84.68%.  RESPIRATORY PARAMETERS The overall apnea/hypopnea index (AHI) was 1.0 per hour. There were 2 total apneas, including 2 obstructive, 0 central and 0 mixed apneas. There were 0 hypopneas and 3 RERAs.  The AHI during Stage REM sleep was 0.0 per hour.  AHI while supine was 0.6 per hour.  The mean oxygen saturation was 96.1%. The minimum SpO2 during sleep was 92.0%.  moderate snoring was noted during this  study.  CARDIAC DATA The 2 lead EKG demonstrated sinus rhythm. The mean heart rate was 65.3 beats per minute. Other EKG findings include: PVCs.  LEG MOVEMENT DATA The total PLMS were 0 with a resulting PLMS index of 0.0. Associated arousal with leg movement index was 0.0 .  IMPRESSIONS - No significant obstructive sleep apnea occurred during this study (AHI = 1.0/h) but inadequate sleep time.  - The patient had minimal or no oxygen desaturation during the study (Min O2 = 92.0%) - The patient snored with moderate snoring volume. - No cardiac abnormalities were noted during this study. - Clinically significant periodic limb movements did not occur during sleep. No significant associated arousals.  DIAGNOSIS - Nondiagnostic study due to insufficient sleep time.   RECOMMENDATIONS - Repeat PSG with sleep aide.  - Avoid alcohol, sedatives and other CNS depressants that may worsen sleep apnea and disrupt normal sleep architecture. - Sleep hygiene should be reviewed to assess factors that may improve sleep quality. - Weight management and regular exercise should be initiated or continued if appropriate.  [Electronically signed] 09/03/2020 09:52 PM  Armanda Magic MD, ABSM Diplomate, American Board of Sleep Medicine

## 2020-09-09 ENCOUNTER — Telehealth: Payer: Self-pay | Admitting: *Deleted

## 2020-09-09 DIAGNOSIS — G4733 Obstructive sleep apnea (adult) (pediatric): Secondary | ICD-10-CM

## 2020-09-09 MED ORDER — ESZOPICLONE 1 MG PO TABS: 2.0000 mg | ORAL_TABLET | Freq: Every day | ORAL | Status: AC

## 2020-09-09 MED ORDER — ESZOPICLONE 1 MG PO TABS: 2.0000 mg | ORAL_TABLET | Freq: Every evening | ORAL | Status: DC | PRN

## 2020-09-09 NOTE — Telephone Encounter (Signed)
-----   Message from Quintella Reichert, MD sent at 09/03/2020  9:55 PM EDT ----- Nondiagnostic sleep study due to inadequate sleep.  Repeat PSG with sleep aide - Lunesta 2mg  (# 1 tablet with 0 refills) take prior to sleep at sleep lab.

## 2020-09-09 NOTE — Telephone Encounter (Addendum)
Informed patient of sleep study results and patient understanding was verbalized. Patient understands his sleep study showed Nondiagnostic sleep study due to inadequate sleep. Repeat PSG with sleep aide - Lunesta 2mg  (# 1 tablet with 0 refills) take prior to sleep at sleep lab.    NPSG order sent to sleep pool. lunesta 2mg  ordered.

## 2020-09-20 MED ORDER — ESZOPICLONE 1 MG PO TABS
2.0000 mg | ORAL_TABLET | Freq: Every day | ORAL | Status: AC
Start: 2020-09-20 — End: 2020-09-21

## 2020-09-20 NOTE — Addendum Note (Signed)
Addended by: Reesa Chew on: 09/20/2020 04:53 PM   Modules accepted: Orders

## 2020-09-23 ENCOUNTER — Telehealth: Payer: Self-pay | Admitting: *Deleted

## 2020-09-23 NOTE — Telephone Encounter (Signed)
Staff message sent to [place]. Ok to schedule sleep study. Per [employer] portal no PA is required.

## 2020-10-02 ENCOUNTER — Ambulatory Visit: Payer: Self-pay | Admitting: Surgery

## 2020-10-02 HISTORY — PX: OTHER SURGICAL HISTORY: SHX169

## 2020-10-02 NOTE — H&P (Signed)
History of Present Illness (Martin [name]; 10/02/2020 1:05 PM) The patient is a 60 year old male who presents with hemorrhoids.Mr. [name] is a 60 yo male who presents for follow up of bleeding internal hemorrhoids. For the last few days he has had recurrent bleeding with bowel movements, and reports some dizziness when he stands after BMs. He is having regular bowel movements and not straining. He is on Xarelto for history of PE. Vitals today are within normal limits. He was last seen a month ago, at which time he had recently resumed Xarelto and bleeding had stopped. He is now having bleeding again and would like to discuss surgery. He had a colonoscopy 1 year ago at the [place] which showed polyps; he is due for next scope in 2024.   Allergies Harold Barban, CMA; 10/02/2020 10:34 AM) Shellfish  No Known Drug Allergies  [06/12/2019]: GARLIC  Allergies Reconciled   Medication History Harold Barban, CMA; 10/02/2020 10:34 AM) Carvedilol (25MG  Tablet, Oral) Active. Anucort-HC (25MG  Suppository, Rectal) Active. Proctofoam HC (1-1% Foam, External) Active. Chlorthalidone (25MG  Tablet, Oral) Active. Diltiazem CD (120MG  Capsule ER 24HR, Oral) Active. Losartan Potassium (25MG  Tablet, Oral) Active. Pravastatin Sodium (40MG  Tablet, Oral) Active. Sildenafil Citrate (20MG  Tablet, Oral) Active. Medications Reconciled  Vitals CMA; 10/02/2020 11:00 AM) 10/02/2020 11:00 AM Weight: 272.25 lb Height: 74in Body Surface Area: 2.48 m Body Mass Index: 34.95 kg/m  Temp.: 97.25F  Pulse: 84 (Regular)  P.OX: 94% (Room air) BP: 150/110(Sitting, Left Arm, Standard)       Physical Exam (Omya Winfield L. [name]; 10/02/2020 1:06 PM) The physical exam findings are as follows: Note: General: resting comfortably, NAD Neuro: alert and oriented Resp: normal work of breathing on room air Anorectal: large external hemorrhoids, nonthrombosed, with partial prolapse of left lateral  hemorrhoid. No blood on digital rectal exam. Anoscopy: significant enlargement and inflammation of left lateral hemorrhoid with partial prolapse. Mild enlargement of right anterior hemorrhoid. Minimal inflammation of right posterior hemorrhoid.    Assessment & Plan (Abree Romick L. [name]; 10/02/2020 1:10 PM) INTERNAL HEMORRHOID, BLEEDING (K64.8) Story: 60 yo male presenting for follow up for bleeding internal hemorrhoids. He is having recurrent bleeding. Given recent PE a few months it will not be feasible to hold his anticoagulation long term, so I recommended proceed with hemorrhoidectomy. Left lateral hemorrhoid appears to be the source of bleeding. Will perform an EUA at time of surgery and plan for left hemorrhoidectomy, possibly right anterior as well depending on intraop findings. He will need to hold his Xarelto for 3 days prior to surgery. Given recent PE he may need to be bridged, will discuss with PCP. Patient will be contacted to schedule a surgery date. Check CBC today.  10/04/2020, [name] Surgery General, Hepatobiliary and Pancreatic Surgery 10/02/20 1:19 PM

## 2020-10-07 ENCOUNTER — Other Ambulatory Visit: Payer: Self-pay | Admitting: Family Medicine

## 2020-10-07 ENCOUNTER — Other Ambulatory Visit: Payer: Self-pay | Admitting: Cardiology

## 2020-10-07 DIAGNOSIS — E785 Hyperlipidemia, unspecified: Secondary | ICD-10-CM

## 2020-10-11 NOTE — Telephone Encounter (Addendum)
Patient is scheduled for lab study on 11/26/20. Patient understands his sleep study will be done at [facility] sleep lab. Patient understands he will receive a sleep packet in a week or so. Patient understands to call if he does not receive the sleep packet in a timely manner. Patient agrees with treatment and thanked me for call. Patient says he received his sleep aide in the mail.

## 2020-11-13 ENCOUNTER — Ambulatory Visit: Payer: BC Managed Care – PPO | Admitting: Medical

## 2020-11-19 ENCOUNTER — Ambulatory Visit: Payer: BC Managed Care – PPO | Admitting: Medical

## 2020-11-19 ENCOUNTER — Other Ambulatory Visit: Payer: Self-pay

## 2020-11-19 ENCOUNTER — Ambulatory Visit (INDEPENDENT_AMBULATORY_CARE_PROVIDER_SITE_OTHER): Payer: 59 | Admitting: Family Medicine

## 2020-11-19 ENCOUNTER — Encounter: Payer: Self-pay | Admitting: Family Medicine

## 2020-11-19 VITALS — BP 132/88 | HR 76 | Temp 98.2°F | Wt 277.2 lb

## 2020-11-19 DIAGNOSIS — Z8616 Personal history of COVID-19: Secondary | ICD-10-CM | POA: Diagnosis not present

## 2020-11-19 DIAGNOSIS — Z8679 Personal history of other diseases of the circulatory system: Secondary | ICD-10-CM | POA: Diagnosis not present

## 2020-11-19 DIAGNOSIS — Z7901 Long term (current) use of anticoagulants: Secondary | ICD-10-CM | POA: Diagnosis not present

## 2020-11-19 DIAGNOSIS — K648 Other hemorrhoids: Secondary | ICD-10-CM | POA: Diagnosis not present

## 2020-11-19 DIAGNOSIS — G4733 Obstructive sleep apnea (adult) (pediatric): Secondary | ICD-10-CM

## 2020-11-19 NOTE — Progress Notes (Signed)
   Subjective:    [name], male    DOB: Jan 03, 1961, 60 y.o.   MRN: [id number]  HPI He is here for a recheck.  Review of the record indicates he did have COVID as well as a PE approximately 6 months ago.  He has been on Xarelto since then.  He has had some difficulty with hemorrhoids and does have surgery pending for that. He also has a history of PAF.  He is scheduled follow-up with cardiology for that and OSA.  Review of Systems     Objective:   Physical Exam Alert and in no distress cardiac exam shows regular rhythm without murmurs or gallops.       Assessment & Plan:  Chronic anticoagulation  Bleeding internal hemorrhoids  History of atrial fibrillation  History of COVID-19  OSA (obstructive sleep apnea) At this point I think it is reasonable to stop the anticoagulation.  He will continue with surgery for the hemorrhoids..  He was comfortable with that.

## 2020-11-26 ENCOUNTER — Ambulatory Visit (HOSPITAL_BASED_OUTPATIENT_CLINIC_OR_DEPARTMENT_OTHER): Payer: Self-pay | Attending: Cardiology | Admitting: Cardiology

## 2020-12-27 ENCOUNTER — Other Ambulatory Visit: Payer: Self-pay | Admitting: Medical

## 2020-12-27 ENCOUNTER — Other Ambulatory Visit: Payer: Self-pay | Admitting: Cardiology

## 2020-12-27 DIAGNOSIS — E785 Hyperlipidemia, unspecified: Secondary | ICD-10-CM

## 2020-12-30 NOTE — Pre-Procedure Instructions (Signed)
Surgical Instructions    Your procedure is scheduled on 01/03/2021.  Report to [facility] Main Entrance "A" at 0900 A.M., then check in with the Admitting office.  Call this number if you have problems the morning of surgery:  [phone]   If you have any questions prior to your surgery date call [phone]: Open Monday-Friday 8am-4pm    Remember:  Do not eat after midnight the night before your surgery    Take these medicines the morning of surgery with A SIP OF WATER  carboxymethylcellulose (REFRESH PLUS) 0.5 % SOLN if needed carvedilol (COREG) 25 MG tablet diltiazem (CARDIZEM CD) 300 MG 24 hr capsule pravastatin (PRAVACHOL) 40 MG tablet  Please follow your surgeon's instructions for rivaroxaban (XARELTO) 20 MG TABS tablet. If you have not received instructions, please contact your surgeon's office for blood thinner instructions.   As of today, STOP taking any Aspirin (unless otherwise instructed by your surgeon) Aleve, Naproxen, Ibuprofen, Motrin, Advil, Goody's, BC's, all herbal medications, fish oil, and all vitamins.          Do not wear jewelry or makeup Do not wear lotions, powders, perfumes/colognes, or deodorant. Do not shave 48 hours prior to surgery.  Men may shave face and neck. Do not bring valuables to the hospital. DO Not wear nail polish, gel polish, artificial nails, or any other type of covering on  natural nails including finger and toenails. If patients have artificial nails, gel coating, etc. that need to be removed by a nail salon please have this removed prior to surgery or surgery may need to be canceled/delayed if the surgeon/ anesthesia feels like the patient is unable to be adequately monitored.             [facility] is not responsible for any belongings or valuables.  Do NOT Smoke (Tobacco/Vaping) or drink Alcohol 24 hours prior to your procedure If you use a CPAP at night, you may bring all equipment for your overnight stay.   Contacts, glasses,  dentures or bridgework may not be worn into surgery, please bring cases for these belongings   For patients admitted to the hospital, discharge time will be determined by your treatment team.   Patients discharged the day of surgery will not be allowed to drive home, and someone needs to stay with them for 24 hours.  ONLY 1 SUPPORT PERSON MAY BE PRESENT WHILE YOU ARE IN SURGERY. IF YOU ARE TO BE ADMITTED ONCE YOU ARE IN YOUR ROOM YOU WILL BE ALLOWED TWO (2) VISITORS.  Minor children may have two parents present. Special consideration for safety and communication needs will be reviewed on a case by case basis.  Special instructions:    Oral Hygiene is also important to reduce your risk of infection.  Remember - BRUSH YOUR TEETH THE MORNING OF SURGERY WITH YOUR REGULAR TOOTHPASTE   [place]- Preparing For Surgery  Before surgery, you can play an important role. Because skin is not sterile, your skin needs to be as free of germs as possible. You can reduce the number of germs on your skin by washing with CHG (chlorahexidine gluconate) Soap before surgery.  CHG is an antiseptic cleaner which kills germs and bonds with the skin to continue killing germs even after washing.     Please do not use if you have an allergy to CHG or antibacterial soaps. If your skin becomes reddened/irritated stop using the CHG.  Do not shave (including legs and underarms) for at least 48  hours prior to first CHG shower. It is OK to shave your face.  Please follow these instructions carefully.     Shower the NIGHT BEFORE SURGERY and the MORNING OF SURGERY with CHG Soap.   If you chose to wash your hair, wash your hair first as usual with your normal shampoo. After you shampoo, rinse your hair and body thoroughly to remove the shampoo.  Then [employer] and genitals (private parts) with your normal soap and rinse thoroughly to remove soap.  After that Use CHG Soap as you would any other liquid soap. You can apply CHG  directly to the skin and wash gently with a scrungie or a clean washcloth.   Apply the CHG Soap to your body ONLY FROM THE NECK DOWN.  Do not use on open wounds or open sores. Avoid contact with your eyes, ears, mouth and genitals (private parts). Wash Face and genitals (private parts)  with your normal soap.   Wash thoroughly, paying special attention to the area where your surgery will be performed.  Thoroughly rinse your body with warm water from the neck down.  DO NOT shower/wash with your normal soap after using and rinsing off the CHG Soap.  Pat yourself dry with a CLEAN TOWEL.  Wear CLEAN PAJAMAS to bed the night before surgery  Place CLEAN SHEETS on your bed the night before your surgery  DO NOT SLEEP WITH PETS.   Day of Surgery:  Take a shower with CHG soap. Wear Clean/Comfortable clothing the morning of surgery Do not apply any deodorants/lotions.   Remember to brush your teeth WITH YOUR REGULAR TOOTHPASTE.   Please read over the following fact sheets that you were given.

## 2020-12-31 ENCOUNTER — Encounter (HOSPITAL_COMMUNITY)
Admission: RE | Admit: 2020-12-31 | Discharge: 2020-12-31 | Disposition: A | Discharge status: Home / Self Care | Payer: 59 | Source: Ambulatory Visit | Attending: Surgery | Admitting: Surgery

## 2020-12-31 ENCOUNTER — Encounter (HOSPITAL_COMMUNITY): Payer: Self-pay

## 2020-12-31 ENCOUNTER — Other Ambulatory Visit: Payer: Self-pay

## 2020-12-31 DIAGNOSIS — Z01812 Encounter for preprocedural laboratory examination: Secondary | ICD-10-CM | POA: Insufficient documentation

## 2020-12-31 HISTORY — DX: Cardiac arrhythmia, unspecified: I49.9

## 2020-12-31 LAB — BASIC METABOLIC PANEL
Anion gap: 6 (ref 5–15)
BUN: 15 mg/dL (ref 8–23)
CO2: 31 mmol/L (ref 22–32)
Calcium: 9.2 mg/dL (ref 8.9–10.3)
Chloride: 102 mmol/L (ref 98–111)
Creatinine, Ser: 1.72 mg/dL — ABNORMAL HIGH (ref 0.61–1.24)
GFR, Estimated: 44 mL/min — ABNORMAL LOW (ref >60)
Glucose, Bld: 164 mg/dL — ABNORMAL HIGH (ref 70–99)
Potassium: 3.4 mmol/L — ABNORMAL LOW (ref 3.5–5.1)
Sodium: 139 mmol/L (ref 135–145)

## 2020-12-31 LAB — CBC
HCT: 30.7 % — ABNORMAL LOW (ref 39.0–52.0)
Hemoglobin: 8.7 g/dL — ABNORMAL LOW (ref 13.0–17.0)
MCH: 21.8 pg — ABNORMAL LOW (ref 26.0–34.0)
MCHC: 28.3 g/dL — ABNORMAL LOW (ref 30.0–36.0)
MCV: 76.9 fL — ABNORMAL LOW (ref 80.0–100.0)
Platelets: 290 10*3/uL (ref 150–400)
RBC: 3.99 MIL/uL — ABNORMAL LOW (ref 4.22–5.81)
RDW: 18.4 % — ABNORMAL HIGH (ref 11.5–15.5)
WBC: 6.9 10*3/uL (ref 4.0–10.5)
nRBC: 0 % (ref 0.0–0.2)

## 2020-12-31 NOTE — Pre-Procedure Instructions (Signed)
Surgical Instructions:    Your procedure is scheduled on Friday, July 15th.  Report to [facility] Main Entrance "A" at 09:00 A.M., then check in with the Admitting office.  Call this number if you have any questions prior to your surgery date, or have problems the morning of surgery:  [phone]    Remember:  Do not eat or drink after midnight the night before your surgery.     Take these medicines the morning of surgery with A SIP OF WATER:   carvedilol (COREG)  diltiazem (CARDIZEM CD) pravastatin (PRAVACHOL)    IF NEEDED: acetaminophen (TYLENOL)  carboxymethylcellulose (REFRESH PLUS) eye drops   As of today, STOP taking any Aspirin (unless otherwise instructed by your surgeon) Aleve, Naproxen, Ibuprofen, Motrin, Advil, Goody's, BC's, all herbal medications, fish oil, and all vitamins.             Special instructions:    [place]- Preparing For Surgery  Before surgery, you can play an important role. Because skin is not sterile, your skin needs to be as free of germs as possible. You can reduce the number of germs on your skin by washing with CHG (chlorahexidine gluconate) Soap before surgery.  CHG is an antiseptic cleaner which kills germs and bonds with the skin to continue killing germs even after washing.     Please do not use if you have an allergy to CHG or antibacterial soaps. If your skin becomes reddened/irritated stop using the CHG.  Do not shave (including legs and underarms) for at least 48 hours prior to first CHG shower. It is OK to shave your face.  Please follow these instructions carefully.     Shower the NIGHT BEFORE SURGERY and the MORNING OF SURGERY with CHG Soap.   If you chose to wash your hair, wash your hair first as usual with your normal shampoo. After you shampoo, rinse your hair and body thoroughly to remove the shampoo.  Then [employer] and genitals (private parts) with your normal soap and rinse thoroughly to remove soap.  After that  Use CHG Soap as you would any other liquid soap. You can apply CHG directly to the skin and wash gently with a scrungie or a clean washcloth.   Apply the CHG Soap to your body ONLY FROM THE NECK DOWN.  Do not use on open wounds or open sores. Avoid contact with your eyes, ears, mouth and genitals (private parts). Wash Face and genitals (private parts)  with your normal soap.   Wash thoroughly, paying special attention to the area where your surgery will be performed.  Thoroughly rinse your body with warm water from the neck down.  DO NOT shower/wash with your normal soap after using and rinsing off the CHG Soap.  Pat yourself dry with a CLEAN TOWEL.  Wear CLEAN PAJAMAS to bed the night before surgery  Place CLEAN SHEETS on your bed the night before your surgery  DO NOT SLEEP WITH PETS.   Day of Surgery:  Take a shower with CHG soap. Wear Clean/Comfortable clothing the morning of surgery Do not wear lotions, powders, perfumes/colognes, or deodorant.   Remember to brush your teeth WITH YOUR REGULAR TOOTHPASTE. Do not wear jewelry. Do not shave 48 hours prior to surgery.  Men may shave face and neck. Do not bring valuables to the hospital. [facility] is not responsible for any belongings or valuables.  Do NOT Smoke (Tobacco/Vaping) or drink Alcohol 24 hours prior to your procedure.  If  you use a CPAP at night, you may bring all equipment for your overnight stay.   Contacts, glasses, dentures or bridgework may not be worn into surgery, please bring cases for these belongings.   For patients admitted to the hospital, discharge time will be determined by your treatment team.  Patients discharged the day of surgery will not be allowed to drive home, and someone needs to stay with them for 24 hours.  ONLY 1 SUPPORT PERSON MAY BE PRESENT WHILE YOU ARE IN SURGERY. IF YOU ARE TO BE ADMITTED ONCE YOU ARE IN YOUR ROOM YOU WILL BE ALLOWED TWO (2) VISITORS.  Minor children may have two  parents present. Special consideration for safety and communication needs will be reviewed on a case by case basis.     Please read over the following fact sheets that you were given.

## 2020-12-31 NOTE — Progress Notes (Signed)
Pt no show to PAT appt, left messages on pt's phone as well as pt's wife's phone.

## 2020-12-31 NOTE — Progress Notes (Signed)
PCP - Ronnald Nian, MD Cardiologist - Armanda Magic, MD  PPM/ICD - Denies  Chest x-ray - 05/26/20 EKG - 05/27/20 Stress Test - Pt does not remember when or where this was done, but does recall having a treadmill stress test. Per Note by Dr. Susann Givens 04/03/16, one was done in the past ECHO - 05/27/20 Cardiac Cath - Denies  Sleep Study - 08/29/20, positive for OSA CPAP - No  Pt is not diabetic  Blood Thinner Instructions: N/A Aspirin Instructions: N/A  ERAS Protcol - No PRE-SURGERY Ensure or G2- N/A  COVID TEST- N/A; Ambulatory sx   Anesthesia review: Review Echo and stress test  Patient denies shortness of breath, fever, cough and chest pain at PAT appointment   All instructions explained to the patient, with a verbal understanding of the material. Patient agrees to go over the instructions while at home for a better understanding. Patient also instructed to self quarantine after being tested for COVID-19. The opportunity to ask questions was provided.

## 2021-01-01 NOTE — Progress Notes (Signed)
Anesthesia Chart Review:  Case: [id number] Date/Time: 01/03/21 1045   Procedures:      HEMORRHOIDECTOMY     RECTAL EXAM UNDER ANESTHESIA   Anesthesia type: Choice   Pre-op diagnosis: INTERNAL HEMORRHOID WITH BLEEDING   [facility] RO[facility] 04 / [facility]   Surgeons: Fritzi Mandes, MD       DISCUSSION: Patient is a 60 year old male scheduled for the above procedure.  He has had recurrent bleeding from internal hemorrhoids with admission 6 months ago.  Given recurrent bleeding, hemorrhoidectomy planned.  Reportedly with colonoscopy 1 year ago at the [facility] which showed polyps with recommendation to repeat in 2024.   History includes never smoker, HTN, HLD, PAF (in setting of acute cholecystitis 05/2019), LVH, hypothyroidism, GI bleed, anemia, OSA (no using CPAP), cholecystectomy (06/01/19).  BMI is consistent with obesity.  - Admission 07/22/20-07/23/20 for rectal bleeding. By Discharge Summar: "Patient was seen by GI and general surgery.  Patient was noted to have  hemorrhoidal bleeding.  Surgery recommended Proctofoam including sitz bath.  Did not have further bleeding.  Patient will be prescribed stool softeners as well.  General surgery to follow the patient as outpatient.  Patient was on Xarelto which has been discontinued at this time.  Patient with CHA2DS2-VASc score of 1.  He does not have clear-cut history of DVT or PE.  At this time risk of bleeding is higher than benefit.  He will be discontinued on Xarelto for at least 1 week.  If he has a strong indication for anticoagulation he could be resumed as outpatient."  - Admission 05/27/20-05/28/20 for SOB and chest pain. CTA with suboptimal timing but . questionable filling defects within the RUL branches and PE could not be excluded.  segmental and subsegmental branches of the right upper lobe.  He was started on heparin drip for suspected acute PE. + COVID-19 test 05/27/20. Full anticoagulation x6 months recommended.  Normal LV and RV systolic function  by echo.  No BLE DVT by ultrasound.  H/H 8.7/30.7, but overall stable when compared to labs in the past five months. HGB range 8.6-8.9 since 07/23/20. He is no longer taking Xarelto per medication list (discontinued after 11/19/20 visit with Dr. Susann Givens). Creatinine 1.72, up from 1.43 on 08/06/20. Labs routed to Dr. Freida Busman for additional preoperative recommendations, if any, otherwise would need future out-patient follow-up.   Anesthesia team to evaluate on the day of surgery.   VS: BP (!) 160/98   Pulse 72   Temp 36.9 C (Oral)   Resp 18   Ht 6\' 1"  (1.854 m)   Wt 125.2 kg   SpO2 99%   BMI 36.43 kg/m   PROVIDERS: , MD is PCP. Last visit 11/19/20. He is aware of surgery plans. 11/21/20, MD is cardiologist. Last visit 06/23/20.  VT stable.  Repeat split-night sleep study ordered to reevaluate OSA.  Maintaining sinus rhythm.  No indication for long-term anticoagulation as CHA2DS2-VASc score was 1 and episode had occurred in the setting of acute illness. On Xarelto for suspected PE, but did not think there was a long term need for anticoagulation form a PAF standpoint.    LABS: Preoperative labs noted. See DISCUSSION. (all labs ordered are listed, but only abnormal results are displayed)  Labs Reviewed  BASIC METABOLIC PANEL - Abnormal; Notable for the following components:      Result Value   Potassium 3.4 (*)    Glucose, Bld 164 (*)    Creatinine, Ser  1.72 (*)    GFR, Estimated 44 (*)    All other components within normal limits  CBC - Abnormal; Notable for the following components:   RBC 3.99 (*)    Hemoglobin 8.7 (*)    HCT 30.7 (*)    MCV 76.9 (*)    MCH 21.8 (*)    MCHC 28.3 (*)    RDW 18.4 (*)    All other components within normal limits    Sleep Study 08/29/20: IMPRESSIONS - No significant obstructive sleep apnea occurred during this study (AHI = 1.0/h) but inadequate sleep time. - The patient had minimal or no oxygen desaturation during the study (Min O2  = 92.0%) - The patient snored with moderate snoring volume. - No cardiac abnormalities were noted during this study. - Clinically significant periodic limb movements did not occur during sleep. No significant associated arousals. DIAGNOSIS - Nondiagnostic study due to insufficient sleep time. (Previous test according to note by Dr. Mayford Knife, " He had a sleep study on 09/17/2019 showing mild OSA with an AHI of 8.1/hr and severe during REM sleep with an AHI of 43/hr and underwent CPAP titration to 12cm H2O.   He had been on PCP therapy but could not tolerate his CPAP and his machine was reclaimed by the DME agency.)  IMAGES: CTA Chest 05/27/20: IMPRESSION: 1. Evaluation is limited by suboptimal contrast bolus timing in addition to streak artifact from the patient's left arm. Given this limitation, there are questionable filling defects within the segmental and subsegmental branches of the right upper lobe. Pulmonary embolism is not excluded. Correlation with D-dimer is recommended. 2. Low lung volumes with streaky airspace opacities in the right lower lobe that are favored to represent atelectasis. - Aortic Atherosclerosis (ICD10-I70.0).    EKG: 05/26/20: Sinus tachycardia with Premature atrial complexes Left axis deviation Right bundle branch block Minimal voltage criteria for LVH, may be normal variant ( R in aVL ) Abnormal ECG Confirmed by Gilda Crease [phone]) on 05/27/2020 12:37:27 AM  CV: Echo 05/27/20: IMPRESSIONS   1. Left ventricular ejection fraction, by estimation, is 55 to 60%. The  left ventricle has normal function. The left ventricle has no regional  wall motion abnormalities. Left ventricular diastolic parameters are  consistent with Grade I diastolic  dysfunction (impaired relaxation).   2. Right ventricular systolic function is normal. The right ventricular  size is normal. Tricuspid regurgitation signal is inadequate for assessing  PA pressure.   3. The  mitral valve is grossly normal. No evidence of mitral valve  regurgitation. No evidence of mitral stenosis.   4. Focal calcification noted on the RCC or the AoV. The aortic valve is  tricuspid. There is mild calcification of the aortic valve. Aortic valve  regurgitation is not visualized. Mild aortic valve sclerosis is present,  with no evidence of aortic valve  stenosis.   5. The inferior vena cava is normal in size with greater than 50%  respiratory variability, suggesting right atrial pressure of 3 mmHg.  - Comparison(s): No significant change from prior study.  Reported prior stress test > 5 years ago.   Past Medical History:  Diagnosis Date   Cardiomegaly    LVH   Cataract    Dysrhythmia    afib   Hemorrhoids    Hyperlipidemia    Hypertension    Hypothyroidism    Lower GI bleeding    OSA (obstructive sleep apnea)    mild OSA with an AHI of 8.1/hr but severe during  REM sleep with AHI 43/hr.  He had O2 desats as low as 85%.   PAF (paroxysmal atrial fibrillation) (HCC)    in setting of acute cholecystitis.  No anticoagulation due to setting of acute illness and CHADS2VAS score of 1    Past Surgical History:  Procedure Laterality Date   CHOLECYSTECTOMY N/A 06/01/2019   Procedure: LAPAROSCOPIC CHOLECYSTECTOMY;  Surgeon: Manus Rudd, MD;  Location: [facility] OR;  Service: General;  Laterality: N/A;   DOPPLER ECHOCARDIOGRAPHY     HERRIODHOIDEECTOMY  10/02/2020   SEE NOTE IN CHART    MEDICATIONS:  acetaminophen (TYLENOL) 500 MG tablet   carboxymethylcellulose (REFRESH PLUS) 0.5 % SOLN   carvedilol (COREG) 25 MG tablet   chlorthalidone (HYGROTON) 25 MG tablet   diltiazem (CARDIZEM CD) 300 MG 24 hr capsule   FIBER ADULT GUMMIES PO   losartan (COZAAR) 50 MG tablet   Multiple Vitamin (MULTIVITAMIN) capsule   polyethylene glycol (MIRALAX / GLYCOLAX) 17 g packet   pravastatin (PRAVACHOL) 40 MG tablet   rivaroxaban (XARELTO) 20 MG TABS tablet   No current facility-administered  medications for this encounter.  Xarelto was discontinued in May.  Shonna Chock, PA-C Surgical Short Stay/Anesthesiology [facility] Phone [phone] [facility] Phone [phone] 01/01/2021 1:32 PM

## 2021-01-01 NOTE — Anesthesia Preprocedure Evaluation (Addendum)
Anesthesia Evaluation  Patient identified by MRN, date of birth, ID band Patient awake    Reviewed: Allergy & Precautions, NPO status , Patient's Chart, lab work & pertinent test results  Airway Mallampati: II  TM Distance: >3 FB Neck ROM: Full    Dental no notable dental hx.    Pulmonary sleep apnea ,    Pulmonary exam normal breath sounds clear to auscultation       Cardiovascular hypertension, Normal cardiovascular exam Rhythm:Regular Rate:Normal  Left Ventricle: Left ventricular ejection fraction, by estimation, is 55  to 60%. The left ventricle has normal function. The left ventricle has no  regional wall motion abnormalities. The left ventricular internal cavity  size was normal in size. There is  no left ventricular hypertrophy. Left ventricular diastolic parameters  are consistent with Grade I diastolic dysfunction (impaired relaxation).   Right Ventricle: The right ventricular size is normal. No increase in  right ventricular wall thickness. Right ventricular systolic function is  normal. Tricuspid regurgitation signal is inadequate for assessing PA  pressure.   Left Atrium: Left atrial size was normal in size.   Right Atrium: Right atrial size was normal in size.   Pericardium: Trivial pericardial effusion is present. Presence of  pericardial fat pad.   Mitral Valve: The mitral valve is grossly normal. No evidence of mitral  valve regurgitation. No evidence of mitral valve stenosis.   Tricuspid Valve: The tricuspid valve is grossly normal. Tricuspid valve  regurgitation is not demonstrated. No evidence of tricuspid stenosis.   Aortic Valve: Focal calcification noted on the RCC or the AoV. The aortic  valve is tricuspid. There is mild calcification of the aortic valve.  Aortic valve regurgitation is not visualized. Mild aortic valve sclerosis  is present, with no evidence of  aortic valve stenosis.   Pulmonic  Valve: The pulmonic valve was grossly normal. Pulmonic valve  regurgitation is not visualized. No evidence of pulmonic stenosis.   Aorta: The aortic root and ascending aorta are structurally normal, with  no evidence of dilitation.    Neuro/Psych negative neurological ROS  negative psych ROS   GI/Hepatic negative GI ROS, Neg liver ROS,   Endo/Other  Hypothyroidism   Renal/GU Renal InsufficiencyRenal disease  negative genitourinary   Musculoskeletal negative musculoskeletal ROS (+)   Abdominal   Peds negative pediatric ROS (+)  Hematology  (+) anemia ,   Anesthesia Other Findings   Reproductive/Obstetrics negative OB ROS                            Anesthesia Physical Anesthesia Plan  ASA: 3  Anesthesia Plan: General   Post-op Pain Management:    Induction: Intravenous  PONV Risk Score and Plan: 2 and Ondansetron, Dexamethasone and Treatment may vary due to age or medical condition  Airway Management Planned: Oral ETT  Additional Equipment:   Intra-op Plan:   Post-operative Plan: Extubation in OR  Informed Consent: I have reviewed the patients History and Physical, chart, labs and discussed the procedure including the risks, benefits and alternatives for the proposed anesthesia with the patient or authorized representative who has indicated his/her understanding and acceptance.     Dental advisory given  Plan Discussed with: CRNA and Surgeon  Anesthesia Plan Comments: (PAT note written 01/01/2021 by Shonna Chock, PA-C. )       Anesthesia Quick Evaluation

## 2021-01-03 ENCOUNTER — Ambulatory Visit (HOSPITAL_COMMUNITY)
Admission: RE | Admit: 2021-01-03 | Discharge: 2021-01-03 | Disposition: A | Discharge status: Home / Self Care | Payer: 59 | Attending: Surgery | Admitting: Surgery

## 2021-01-03 ENCOUNTER — Encounter (HOSPITAL_COMMUNITY): Payer: Self-pay | Admitting: Surgery

## 2021-01-03 ENCOUNTER — Ambulatory Visit (HOSPITAL_COMMUNITY): Payer: 59 | Admitting: Vascular Surgery

## 2021-01-03 ENCOUNTER — Other Ambulatory Visit: Payer: Self-pay

## 2021-01-03 ENCOUNTER — Ambulatory Visit (HOSPITAL_COMMUNITY): Payer: 59 | Admitting: Anesthesiology

## 2021-01-03 ENCOUNTER — Ambulatory Visit (HOSPITAL_COMMUNITY)
Admission: RE | Disposition: A | Discharge status: Home / Self Care | Payer: Self-pay | Source: Home / Self Care | Attending: Surgery

## 2021-01-03 DIAGNOSIS — Z7901 Long term (current) use of anticoagulants: Secondary | ICD-10-CM | POA: Insufficient documentation

## 2021-01-03 DIAGNOSIS — Z79899 Other long term (current) drug therapy: Secondary | ICD-10-CM | POA: Insufficient documentation

## 2021-01-03 DIAGNOSIS — K648 Other hemorrhoids: Secondary | ICD-10-CM | POA: Diagnosis present

## 2021-01-03 DIAGNOSIS — Z9049 Acquired absence of other specified parts of digestive tract: Secondary | ICD-10-CM | POA: Insufficient documentation

## 2021-01-03 DIAGNOSIS — I480 Paroxysmal atrial fibrillation: Secondary | ICD-10-CM | POA: Insufficient documentation

## 2021-01-03 DIAGNOSIS — G4733 Obstructive sleep apnea (adult) (pediatric): Secondary | ICD-10-CM | POA: Insufficient documentation

## 2021-01-03 DIAGNOSIS — E785 Hyperlipidemia, unspecified: Secondary | ICD-10-CM | POA: Diagnosis not present

## 2021-01-03 HISTORY — PX: RECTAL EXAM UNDER ANESTHESIA: SHX6399

## 2021-01-03 HISTORY — PX: HEMORRHOID SURGERY: SHX153

## 2021-01-03 SURGERY — HEMORRHOIDECTOMY
Anesthesia: General

## 2021-01-03 MED ORDER — OXYCODONE HCL 5 MG PO TABS: 5.0000 mg | ORAL_TABLET | Freq: Four times a day (QID) | ORAL | 0 refills | Status: DC | PRN

## 2021-01-03 MED ORDER — BUPIVACAINE LIPOSOME 1.3 % IJ SUSP
INTRAMUSCULAR | Status: AC
  Filled 2021-01-03: qty 20

## 2021-01-03 MED ORDER — ROCURONIUM BROMIDE 10 MG/ML (PF) SYRINGE
PREFILLED_SYRINGE | INTRAVENOUS | Status: AC
  Filled 2021-01-03: qty 10

## 2021-01-03 MED ORDER — SUGAMMADEX SODIUM 500 MG/5ML IV SOLN
INTRAVENOUS | Status: AC
  Filled 2021-01-03: qty 5

## 2021-01-03 MED ORDER — ONDANSETRON HCL 4 MG/2ML IJ SOLN
INTRAMUSCULAR | Status: DC | PRN
  Administered 2021-01-03: 4 mg via INTRAVENOUS

## 2021-01-03 MED ORDER — LIDOCAINE 2% (20 MG/ML) 5 ML SYRINGE
INTRAMUSCULAR | Status: AC
  Filled 2021-01-03: qty 5

## 2021-01-03 MED ORDER — CHLORHEXIDINE GLUCONATE 0.12 % MT SOLN
15.0000 mL | Freq: Once | OROMUCOSAL | Status: AC
  Administered 2021-01-03: 15 mL via OROMUCOSAL
  Filled 2021-01-03: qty 15

## 2021-01-03 MED ORDER — GABAPENTIN 300 MG PO CAPS: 300.0000 mg | ORAL_CAPSULE | Freq: Two times a day (BID) | ORAL | 0 refills | Status: DC

## 2021-01-03 MED ORDER — GABAPENTIN 300 MG PO CAPS
300.0000 mg | ORAL_CAPSULE | ORAL | Status: AC
  Administered 2021-01-03: 300 mg via ORAL
  Filled 2021-01-03: qty 1

## 2021-01-03 MED ORDER — ORAL CARE MOUTH RINSE: 15.0000 mL | Freq: Once | OROMUCOSAL | Status: AC

## 2021-01-03 MED ORDER — DEXAMETHASONE SODIUM PHOSPHATE 10 MG/ML IJ SOLN
INTRAMUSCULAR | Status: AC
  Filled 2021-01-03: qty 1

## 2021-01-03 MED ORDER — THROMBIN (RECOMBINANT) 20000 UNITS EX SOLR
CUTANEOUS | Status: AC
  Filled 2021-01-03: qty 20000

## 2021-01-03 MED ORDER — ONDANSETRON HCL 4 MG/2ML IJ SOLN: 4.0000 mg | Freq: Once | INTRAMUSCULAR | Status: DC | PRN

## 2021-01-03 MED ORDER — MIDAZOLAM HCL 2 MG/2ML IJ SOLN
INTRAMUSCULAR | Status: DC | PRN
  Administered 2021-01-03: 2 mg via INTRAVENOUS

## 2021-01-03 MED ORDER — EPHEDRINE 5 MG/ML INJ
INTRAVENOUS | Status: AC
  Filled 2021-01-03: qty 10

## 2021-01-03 MED ORDER — EPHEDRINE SULFATE-NACL 50-0.9 MG/10ML-% IV SOSY
PREFILLED_SYRINGE | INTRAVENOUS | Status: DC | PRN
  Administered 2021-01-03: 15 mg via INTRAVENOUS
  Administered 2021-01-03: 5 mg via INTRAVENOUS

## 2021-01-03 MED ORDER — PROPOFOL 10 MG/ML IV BOLUS
INTRAVENOUS | Status: DC | PRN
  Administered 2021-01-03: 30 mg via INTRAVENOUS
  Administered 2021-01-03: 40 mg via INTRAVENOUS
  Administered 2021-01-03: 130 mg via INTRAVENOUS

## 2021-01-03 MED ORDER — ACETAMINOPHEN 500 MG PO TABS
1000.0000 mg | ORAL_TABLET | ORAL | Status: AC
  Administered 2021-01-03: 1000 mg via ORAL
  Filled 2021-01-03: qty 2

## 2021-01-03 MED ORDER — FENTANYL CITRATE (PF) 100 MCG/2ML IJ SOLN
INTRAMUSCULAR | Status: DC | PRN
  Administered 2021-01-03 (×2): 50 ug via INTRAVENOUS

## 2021-01-03 MED ORDER — SUGAMMADEX SODIUM 200 MG/2ML IV SOLN
INTRAVENOUS | Status: DC | PRN
  Administered 2021-01-03: 300 mg via INTRAVENOUS

## 2021-01-03 MED ORDER — LIDOCAINE HCL (PF) 2 % IJ SOLN
INTRAMUSCULAR | Status: DC | PRN
  Administered 2021-01-03: 100 mg via INTRADERMAL

## 2021-01-03 MED ORDER — LACTATED RINGERS IV SOLN: INTRAVENOUS | Status: DC

## 2021-01-03 MED ORDER — MIDAZOLAM HCL 2 MG/2ML IJ SOLN
INTRAMUSCULAR | Status: AC
  Filled 2021-01-03: qty 2

## 2021-01-03 MED ORDER — PHENYLEPHRINE 40 MCG/ML (10ML) SYRINGE FOR IV PUSH (FOR BLOOD PRESSURE SUPPORT)
PREFILLED_SYRINGE | INTRAVENOUS | Status: AC
  Filled 2021-01-03: qty 10

## 2021-01-03 MED ORDER — FENTANYL CITRATE (PF) 250 MCG/5ML IJ SOLN
INTRAMUSCULAR | Status: AC
  Filled 2021-01-03: qty 5

## 2021-01-03 MED ORDER — 0.9 % SODIUM CHLORIDE (POUR BTL) OPTIME
TOPICAL | Status: DC | PRN
  Administered 2021-01-03: 1000 mL

## 2021-01-03 MED ORDER — ONDANSETRON HCL 4 MG/2ML IJ SOLN
INTRAMUSCULAR | Status: AC
  Filled 2021-01-03: qty 2

## 2021-01-03 MED ORDER — PROPOFOL 10 MG/ML IV BOLUS
INTRAVENOUS | Status: AC
  Filled 2021-01-03: qty 20

## 2021-01-03 MED ORDER — DOCUSATE SODIUM 100 MG PO CAPS: 100.0000 mg | ORAL_CAPSULE | Freq: Two times a day (BID) | ORAL | 0 refills | Status: AC

## 2021-01-03 MED ORDER — ROCURONIUM BROMIDE 10 MG/ML (PF) SYRINGE
PREFILLED_SYRINGE | INTRAVENOUS | Status: DC | PRN
  Administered 2021-01-03: 100 mg via INTRAVENOUS

## 2021-01-03 MED ORDER — OXYCODONE HCL 5 MG PO TABS: 5.0000 mg | ORAL_TABLET | Freq: Once | ORAL | Status: DC | PRN

## 2021-01-03 MED ORDER — OXYCODONE HCL 5 MG/5ML PO SOLN
5.0000 mg | Freq: Once | ORAL | Status: DC | PRN
Start: 2021-01-03 — End: 2021-01-03

## 2021-01-03 MED ORDER — PHENYLEPHRINE 40 MCG/ML (10ML) SYRINGE FOR IV PUSH (FOR BLOOD PRESSURE SUPPORT)
PREFILLED_SYRINGE | INTRAVENOUS | Status: DC | PRN
  Administered 2021-01-03 (×5): 80 ug via INTRAVENOUS

## 2021-01-03 MED ORDER — POLYETHYLENE GLYCOL 3350 17 G PO PACK: 17.0000 g | PACK | Freq: Every day | ORAL | 0 refills | Status: AC

## 2021-01-03 MED ORDER — BUPIVACAINE LIPOSOME 1.3 % IJ SUSP: 20.0000 mL | Freq: Once | INTRAMUSCULAR | Status: DC

## 2021-01-03 MED ORDER — SODIUM CHLORIDE 0.9 % IV SOLN
INTRAVENOUS | Status: DC | PRN
  Administered 2021-01-03: 40 mL

## 2021-01-03 MED ORDER — THROMBIN 20000 UNITS EX KIT
PACK | CUTANEOUS | Status: DC | PRN
  Administered 2021-01-03: 20 mL via TOPICAL

## 2021-01-03 MED ORDER — HYDROMORPHONE HCL 1 MG/ML IJ SOLN: 0.2500 mg | INTRAMUSCULAR | Status: DC | PRN

## 2021-01-03 MED ORDER — DEXAMETHASONE SODIUM PHOSPHATE 10 MG/ML IJ SOLN
INTRAMUSCULAR | Status: DC | PRN
  Administered 2021-01-03: 10 mg via INTRAVENOUS

## 2021-01-03 SURGICAL SUPPLY — 29 items
BAG COUNTER SPONGE SURGICOUNT (BAG) ×2 IMPLANT
BAG SPNG CNTER NS LX DISP (BAG) ×1
BRIEF STRETCH FOR OB PAD XXL (UNDERPADS AND DIAPERS) ×1 IMPLANT
CANISTER SUCT 3000ML PPV (MISCELLANEOUS) ×2 IMPLANT
COVER SURGICAL LIGHT HANDLE (MISCELLANEOUS) ×2 IMPLANT
DRAPE LAPAROTOMY T 98X78 PEDS (DRAPES) ×2 IMPLANT
DRSG PAD ABDOMINAL 8X10 ST (GAUZE/BANDAGES/DRESSINGS) ×1 IMPLANT
ELECT REM PT RETURN 9FT ADLT (ELECTROSURGICAL) ×2
ELECTRODE REM PT RTRN 9FT ADLT (ELECTROSURGICAL) ×1 IMPLANT
GAUZE 4X4 16PLY ~~LOC~~+RFID DBL (SPONGE) ×2 IMPLANT
GAUZE SPONGE 4X4 12PLY STRL (GAUZE/BANDAGES/DRESSINGS) ×3 IMPLANT
GOWN STRL REUS W/ TWL LRG LVL3 (GOWN DISPOSABLE) ×2 IMPLANT
GOWN STRL REUS W/TWL LRG LVL3 (GOWN DISPOSABLE) ×4
KIT BASIN OR (CUSTOM PROCEDURE TRAY) ×2 IMPLANT
KIT TURNOVER KIT B (KITS) ×2 IMPLANT
NEEDLE 22X1 1/2 (OR ONLY) (NEEDLE) ×2 IMPLANT
NS IRRIG 1000ML POUR BTL (IV SOLUTION) ×2 IMPLANT
PACK GENERAL/GYN (CUSTOM PROCEDURE TRAY) ×2 IMPLANT
PAD ARMBOARD 7.5X6 YLW CONV (MISCELLANEOUS) ×4 IMPLANT
PENCIL SMOKE EVACUATOR (MISCELLANEOUS) ×2 IMPLANT
SPECIMEN JAR SMALL (MISCELLANEOUS) ×2 IMPLANT
SPONGE HEMORRHOID 8X3CM (HEMOSTASIS) ×1 IMPLANT
SPONGE SURGIFOAM ABS GEL 100 (HEMOSTASIS) IMPLANT
SPONGE T-LAP 18X18 ~~LOC~~+RFID (SPONGE) ×2 IMPLANT
SURGILUBE 2OZ TUBE FLIPTOP (MISCELLANEOUS) ×2 IMPLANT
SUT CHROMIC 2 0 SH (SUTURE) ×4 IMPLANT
SYR 20ML LL LF (SYRINGE) ×2 IMPLANT
SYR CONTROL 10ML LL (SYRINGE) ×2 IMPLANT
TOWEL GREEN STERILE FF (TOWEL DISPOSABLE) ×4 IMPLANT

## 2021-01-03 NOTE — H&P (Signed)
Martin [name] is an 60 y.o. male.   Chief Complaint: bleeding per rectum HPI: Martin [name] is a 60 yo male who presented with bleeding per rectum from internal hemorrhoids. He was on Xarelto for a PE and having frequent bleeding. He had a colonoscopy a year ago at the [place] that showed polyps but no malignancy. His Xarelto was recently stopped after he completed PE treatment. He continues to have intermittent bleeding and presents today for hemorrhoidectomy. He has been taking fiber and stool softeners regularly.  Past Medical History:  Diagnosis Date   Cardiomegaly    LVH   Cataract    Dysrhythmia    afib   Hemorrhoids    Hyperlipidemia    Hypertension    Hypothyroidism    Lower GI bleeding    OSA (obstructive sleep apnea)    mild OSA with an AHI of 8.1/hr but severe during REM sleep with AHI 43/hr.  He had O2 desats as low as 85%.   PAF (paroxysmal atrial fibrillation) (HCC)    in setting of acute cholecystitis.  No anticoagulation due to setting of acute illness and CHADS2VAS score of 1    Past Surgical History:  Procedure Laterality Date   CHOLECYSTECTOMY N/A 06/01/2019   Procedure: LAPAROSCOPIC CHOLECYSTECTOMY;  Surgeon: Manus Rudd, MD;  Location: [facility] OR;  Service: General;  Laterality: N/A;   DOPPLER ECHOCARDIOGRAPHY     HERRIODHOIDEECTOMY  10/02/2020   SEE NOTE IN CHART    Family History  Problem Relation Age of Onset   Kidney disease Mother    Hypertension Father    Kidney disease Father    Asthma Father    Social History:  reports that he has never smoked. He has never used smokeless tobacco. He reports current alcohol use. He reports that he does not use drugs.  Allergies:  Allergies  Allergen Reactions   Garlic Swelling   Shellfish Allergy Swelling    Medications Prior to Admission  Medication Sig Dispense Refill   carvedilol (COREG) 25 MG tablet Take 1 tablet by mouth twice daily (Patient taking differently: Take 25 mg by mouth 2 (two) times daily.)  180 tablet 3   chlorthalidone (HYGROTON) 25 MG tablet Take 1 tablet by mouth once daily (Patient taking differently: Take 25 mg by mouth daily.) 30 tablet 8   diltiazem (CARDIZEM CD) 300 MG 24 hr capsule Take 1 capsule by mouth once daily (Patient taking differently: Take 300 mg by mouth daily.) 90 capsule 0   FIBER ADULT GUMMIES PO Take 1 capsule by mouth daily as needed (constipation).     losartan (COZAAR) 50 MG tablet Take 2 tablets (100 mg total) by mouth daily. 180 tablet 3   Multiple Vitamin (MULTIVITAMIN) capsule Take 1 capsule by mouth daily.     pravastatin (PRAVACHOL) 40 MG tablet Take 1 tablet by mouth once daily (Patient taking differently: Take 40 mg by mouth daily.) 90 tablet 0   acetaminophen (TYLENOL) 500 MG tablet Take 500 mg by mouth every 6 (six) hours as needed for moderate pain or headache.     carboxymethylcellulose (REFRESH PLUS) 0.5 % SOLN Place 1 drop into both eyes daily as needed (dry eyes).     polyethylene glycol (MIRALAX / GLYCOLAX) 17 g packet Take 17 g by mouth daily. (Patient not taking: No sig reported) 14 each 0   rivaroxaban (XARELTO) 20 MG TABS tablet Take 1 tablet (20 mg total) by mouth daily with supper. (Patient not taking: No sig reported) 90 tablet 1  No results found for this or any previous visit (from the past 48 hour(s)). No results found.  Review of Systems  Blood pressure 138/78, pulse 80, temperature 98 F (36.7 C), temperature source Oral, resp. rate 18, height 6\' 1"  (1.854 m), weight 125.2 kg, SpO2 97 %. Physical Exam Constitutional:      General: He is not in acute distress.    Appearance: Normal appearance.  HENT:     Head: Normocephalic and atraumatic.  Eyes:     General: No scleral icterus.    Conjunctiva/sclera: Conjunctivae normal.  Pulmonary:     Effort: Pulmonary effort is normal. No respiratory distress.     Breath sounds: No stridor.  Musculoskeletal:        General: Normal range of motion.     Cervical back: Normal  range of motion.  Skin:    General: Skin is warm and dry.  Neurological:     General: No focal deficit present.     Mental Status: He is alert and oriented to person, place, and time.  Psychiatric:        Mood and Affect: Mood normal.        Behavior: Behavior normal.        Thought Content: Thought content normal.     Assessment/Plan 60 yo male with bleeding internal hemorrhoids. Proceed to OR for hemorrhoidectomy. Plan for discharge home from PACU. Informed consent obtained.  66, MD 01/03/2021, 11:46 AM

## 2021-01-03 NOTE — Transfer of Care (Signed)
Immediate Anesthesia Transfer of Care Note  Patient: Martin [name]  Procedure(s) Performed: HEMORRHOIDECTOMY RECTAL EXAM UNDER ANESTHESIA  Patient Location: PACU  Anesthesia Type:General  Level of Consciousness: awake and oriented  Airway & Oxygen Therapy: Patient Spontanous Breathing and Patient connected to face mask oxygen  Post-op Assessment: Report given to RN  Post vital signs: Reviewed and stable  Last Vitals:  Vitals Value Taken Time  BP 145/104 01/03/21 1315  Temp    Pulse 60 01/03/21 1319  Resp 34 01/03/21 1319  SpO2 100 % 01/03/21 1319  Vitals shown include unvalidated device data.  Last Pain:  Vitals:   01/03/21 0918  TempSrc:   PainSc: 0-No pain         Complications: No notable events documented.

## 2021-01-03 NOTE — Anesthesia Procedure Notes (Addendum)
Procedure Name: Intubation Date/Time: 01/03/2021 12:10 PM Performed by: Barrington Ellison, CRNA Pre-anesthesia Checklist: Patient identified, Emergency Drugs available, Suction available and Patient being monitored Patient Re-evaluated:Patient Re-evaluated prior to induction Oxygen Delivery Method: Circle System Utilized Preoxygenation: Pre-oxygenation with 100% oxygen Induction Type: IV induction Ventilation: Mask ventilation without difficulty Laryngoscope Size: Glidescope and 4 Grade View: Grade I Tube type: Oral Tube size: 7.5 mm Number of attempts: 2 Airway Equipment and Method: Oral airway and Rigid stylet Placement Confirmation: ETT inserted through vocal cords under direct vision, positive ETCO2 and breath sounds checked- equal and bilateral Secured at: 23 cm Tube secured with: Tape Dental Injury: Teeth and Oropharynx as per pre-operative assessment  Difficulty Due To: Difficulty was unanticipated and Difficult Airway- due to large tongue Future Recommendations: Recommend- induction with short-acting agent, and alternative techniques readily available Comments: DL x 1 Mac 4, Grade 3 view, unable to enter trachea. DL x 1 with Glidescope grade 1 view.

## 2021-01-03 NOTE — Discharge Instructions (Addendum)
[facility] DISCHARGE INSTRUCTIONS  Activity You may resume activities as tolerated. Do not drive while taking narcotic pain medication.  Wound Care You may feel some sutures on the skin around the anus - these will dissolve and do not need to be removed. You may have some drainage and small amounts of bleeding after surgery. If you have persistent excessive bleeding, please call the office immediately or come to the emergency room. There is packing in your rectum that will fall out when you have your first bowel movement after surgery - this should not be replaced. You should perform sitz baths 3 times daily and after bowel movements - sit in several inches of warm water for 15-20 minutes to keep the area clean.  Medications Take the stool softeners and fiber as prescribed. It is very important to avoid constipation and straining with bowel movements. Drink plenty of water every day. Take the pain medication as prescribed. It is normal to have significant pain after hemorrhoidectomy.  When to Call us: Fever greater than 100.5 Persistent bleeding per rectum Difficulty urinating  Follow-up You have an appointment scheduled with Dr. Freida Busman on January 16, 2021 at 2:00pm. This will be at the [facility] Surgery office at [address], [place], [place]. Please arrive at least 15 minutes prior to your scheduled appointment time.  For questions or concerns, please call the office at [phone].

## 2021-01-03 NOTE — Op Note (Signed)
Date: 01/03/21  Patient: Martin [name] MRN: [id number]  Preoperative Diagnosis: Bleeding internal hemorrhoids Postoperative Diagnosis: Same  Procedure: Rectal exam under anesthesia, hemorrhoidectomy  Surgeon: Sophronia Simas, MD  EBL: Minimal  Anesthesia: General  Specimens: Hemorrhoids  Indications: Mr. Ohms is a 60 yo male who presented with persistent bleeding from internal hemorrhoids. Colonoscopy did not show any malignancy. After a discussion of the risks and benefits of surgery, he agreed to proceed with hemorrhoidectomy.  Findings: Enlargement and inflammation of internal hemorrhoids. The two most inflamed hemorrhoids were excised, which were the left lateral and right anterior hemorrhoids.  Procedure details: Informed consent was obtained in the preoperative area prior to the procedure. The patient was brought to the operating room and general anesthesia was induced. The patient was placed in the prone position and was then prepped and draped in the usual sterile fashion. A pre-procedure timeout was taken verifying patient identity, surgical site and procedure to be performed.  A digital rectal exam was performed and there were no masses. There were multiple external hemorrhoids. A Hill-Ferguson retractor was placed. All three internal hemorrhoids were enlarged. The left lateral and right anterior internal hemorrhoids were the largest. There were no rectal masses. The left lateral hemorrhoid was excised using cautery starting at the anal verge and working superiorly, excising the external skin tag with the specimen. The mucosal defect was then closed with a running locking 2-0 chromic suture. The right anterior hemorrhoid was excised with cautery in similar fashion, also excising the associated external skin tag. The mucosal defect was closed with a running locking 2-0 chromic suture. The hemorrhoids were sent for routine pathology. Both excision sites were hemostatic. A field  block was placed using Exparel (20 mL exparel mixed with 20 mL saline). Gelfoam packing soaked in thrombin was placed in the rectum.  The patient tolerated the procedure with no apparent complications. All counts were correct x2 at the end of the procedure. The patient was extubated and taken to PACU in stable condition.  Sophronia Simas, MD 01/03/21 1:03 PM

## 2021-01-03 NOTE — Progress Notes (Signed)
Patient is in phase 2, VS stable and ready for d/c- waiting for patient to void prior to d/c per Dr. Freida Busman. Monitoring no longer necessary at this time.

## 2021-01-06 ENCOUNTER — Encounter (HOSPITAL_COMMUNITY): Payer: Self-pay | Admitting: Surgery

## 2021-01-06 LAB — SURGICAL PATHOLOGY

## 2021-01-06 MED FILL — Thrombin (Recombinant) For Soln 20000 Unit: CUTANEOUS | Qty: 1 | Status: AC

## 2021-01-06 NOTE — Anesthesia Postprocedure Evaluation (Signed)
Anesthesia Post Note  Patient: Martin [name]  Procedure(s) Performed: HEMORRHOIDECTOMY RECTAL EXAM UNDER ANESTHESIA     Patient location during evaluation: PACU Anesthesia Type: General Level of consciousness: awake and alert Pain management: pain level controlled Vital Signs Assessment: post-procedure vital signs reviewed and stable Respiratory status: spontaneous breathing, nonlabored ventilation, respiratory function stable and patient connected to nasal cannula oxygen Cardiovascular status: blood pressure returned to baseline and stable Postop Assessment: no apparent nausea or vomiting Anesthetic complications: no   No notable events documented.  Last Vitals:  Vitals:   01/03/21 1345 01/03/21 1400  BP: (!) 143/90 (!) 144/96  Pulse: 63 (!) 59  Resp: 19 16  Temp: (!) 36.2 C   SpO2: 100% 100%    Last Pain:  Vitals:   01/03/21 1345  TempSrc:   PainSc: 0-No pain                 [name]

## 2021-01-10 ENCOUNTER — Encounter (HOSPITAL_COMMUNITY): Payer: Self-pay | Admitting: Surgery

## 2021-01-10 MED ORDER — HEMOSTATIC AGENTS (NO CHARGE) OPTIME
TOPICAL | Status: DC | PRN
  Administered 2021-01-03: 1 via TOPICAL

## 2021-01-13 ENCOUNTER — Encounter (HOSPITAL_COMMUNITY): Payer: Self-pay | Admitting: Surgery

## 2021-02-25 ENCOUNTER — Ambulatory Visit (INDEPENDENT_AMBULATORY_CARE_PROVIDER_SITE_OTHER): Payer: 59 | Admitting: Family Medicine

## 2021-02-25 ENCOUNTER — Telehealth: Payer: Self-pay | Admitting: Family Medicine

## 2021-02-25 ENCOUNTER — Other Ambulatory Visit: Payer: Self-pay

## 2021-02-25 VITALS — BP 132/88 | HR 66 | Temp 97.7°F | Wt 275.8 lb

## 2021-02-25 DIAGNOSIS — R739 Hyperglycemia, unspecified: Secondary | ICD-10-CM

## 2021-02-25 DIAGNOSIS — D649 Anemia, unspecified: Secondary | ICD-10-CM | POA: Diagnosis not present

## 2021-02-25 DIAGNOSIS — Z23 Encounter for immunization: Secondary | ICD-10-CM | POA: Diagnosis not present

## 2021-02-25 DIAGNOSIS — G4733 Obstructive sleep apnea (adult) (pediatric): Secondary | ICD-10-CM | POA: Diagnosis not present

## 2021-02-25 DIAGNOSIS — I10 Essential (primary) hypertension: Secondary | ICD-10-CM

## 2021-02-25 DIAGNOSIS — Z8616 Personal history of COVID-19: Secondary | ICD-10-CM

## 2021-02-25 DIAGNOSIS — Z131 Encounter for screening for diabetes mellitus: Secondary | ICD-10-CM

## 2021-02-25 DIAGNOSIS — E7439 Other disorders of intestinal carbohydrate absorption: Secondary | ICD-10-CM

## 2021-02-25 DIAGNOSIS — I480 Paroxysmal atrial fibrillation: Secondary | ICD-10-CM

## 2021-02-25 LAB — CBC WITH DIFFERENTIAL/PLATELET
Basophils Absolute: 0.1 10*3/uL (ref 0.0–0.2)
Basos: 1 %
EOS (ABSOLUTE): 0.2 10*3/uL (ref 0.0–0.4)
Eos: 3 %
Hematocrit: 31.8 % — ABNORMAL LOW (ref 37.5–51.0)
Hemoglobin: 9.2 g/dL — ABNORMAL LOW (ref 13.0–17.7)
Immature Grans (Abs): 0 10*3/uL (ref 0.0–0.1)
Immature Granulocytes: 0 %
Lymphocytes Absolute: 2.1 10*3/uL (ref 0.7–3.1)
Lymphs: 28 %
MCH: 22.4 pg — ABNORMAL LOW (ref 26.6–33.0)
MCHC: 28.9 g/dL — ABNORMAL LOW (ref 31.5–35.7)
MCV: 78 fL — ABNORMAL LOW (ref 79–97)
Monocytes Absolute: 0.7 10*3/uL (ref 0.1–0.9)
Monocytes: 9 %
Neutrophils Absolute: 4.5 10*3/uL (ref 1.4–7.0)
Neutrophils: 59 %
Platelets: 315 10*3/uL (ref 150–450)
RBC: 4.1 x10E6/uL — ABNORMAL LOW (ref 4.14–5.80)
RDW: 18.1 % — ABNORMAL HIGH (ref 11.6–15.4)
WBC: 7.6 10*3/uL (ref 3.4–10.8)

## 2021-02-25 LAB — POCT GLYCOSYLATED HEMOGLOBIN (HGB A1C): Hemoglobin A1C: 6 % — AB (ref 4.0–5.6)

## 2021-02-25 NOTE — Telephone Encounter (Signed)
error 

## 2021-02-25 NOTE — Patient Instructions (Signed)
At least 20 minutes of something physical every day.  Walk, jog, ride a bike, push a lawnmower Cut back on carbohydrates and the easiest way to remember it is "white food".  Bread, rice, pasta, potatoes and sugar

## 2021-02-25 NOTE — Progress Notes (Signed)
   Subjective:    [name], male    DOB: [birth year], 60 y.o.   MRN: [id number]  HPI He is here for recheck.  He has had hemorrhoidectomy and is no longer having any difficulty with bleeding.  He has stopped his Eliquis.  He has a previous history of having COVID and subsequent PE and PAF.[name]  Recent blood work did show an anemia.  He also has a history of OSA and is in the process of getting this further evaluated and treated by Dr. Mayford Knife.  He did have elevated CBGs necessitating follow-up per possible diabetes.   Review of Systems     Objective:   Physical Exam Alert and in no distress otherwise not examined  Hemoglobin A1c is 6.0     Assessment & Plan:  Anemia, unspecified type - Plan: CBC with Differential/Platelet  OSA (obstructive sleep apnea)  Essential hypertension  Need for influenza vaccination - Plan: Flu Vaccine QUAD 6+ mos PF IM (Fluarix Quad PF)  Screening for diabetes mellitus  Elevated blood sugar - Plan: HgB A1c  Glucose intolerance  Paroxysmal atrial fibrillation (HCC)  History of COVID-19 He will follow-up with Dr. Mayford Knife concerning the OSA.  Continue on his present medication regimen. I explained the risk of diabetes with him and strongly encouraged him to make further diet and exercise changes.  Discussed cutting back on carbohydrates.  Explained the connection between OSA, hypertension and elevated blood sugars.

## 2021-04-08 ENCOUNTER — Other Ambulatory Visit: Payer: Self-pay | Admitting: Cardiology

## 2021-04-08 ENCOUNTER — Other Ambulatory Visit: Payer: Self-pay | Admitting: Medical

## 2021-04-08 DIAGNOSIS — E785 Hyperlipidemia, unspecified: Secondary | ICD-10-CM

## 2021-04-30 ENCOUNTER — Encounter: Payer: Self-pay | Admitting: Medical

## 2021-04-30 ENCOUNTER — Telehealth: Payer: 59 | Admitting: Medical

## 2021-04-30 ENCOUNTER — Other Ambulatory Visit: Payer: Self-pay

## 2021-04-30 NOTE — Progress Notes (Signed)
Despite numerous attempts to call him back or get him on the phone, unable to make contact with patient after his initial triage with front office.

## 2021-05-13 ENCOUNTER — Telehealth: Payer: Self-pay

## 2021-05-13 NOTE — Telephone Encounter (Signed)
Letter has been sent to patient informing them that their sleep study has expired. Patient will need to call and schedule an office visit to re-evaluate the need for a sleep study.    

## 2021-05-20 ENCOUNTER — Other Ambulatory Visit: Payer: Self-pay

## 2021-05-20 ENCOUNTER — Ambulatory Visit (INDEPENDENT_AMBULATORY_CARE_PROVIDER_SITE_OTHER): Payer: 59

## 2021-05-20 DIAGNOSIS — Z23 Encounter for immunization: Secondary | ICD-10-CM

## 2021-06-25 ENCOUNTER — Other Ambulatory Visit: Payer: Self-pay | Admitting: Medical

## 2021-06-25 ENCOUNTER — Other Ambulatory Visit: Payer: Self-pay | Admitting: Cardiology

## 2021-06-25 DIAGNOSIS — E785 Hyperlipidemia, unspecified: Secondary | ICD-10-CM

## 2021-07-03 ENCOUNTER — Other Ambulatory Visit: Payer: Self-pay

## 2021-07-03 ENCOUNTER — Encounter (HOSPITAL_COMMUNITY): Payer: Self-pay | Admitting: Emergency Medicine

## 2021-07-03 ENCOUNTER — Ambulatory Visit (HOSPITAL_COMMUNITY)
Admission: EM | Admit: 2021-07-03 | Discharge: 2021-07-03 | Disposition: A | Discharge status: Home / Self Care | Payer: Worker's Compensation | Attending: Family | Admitting: Family

## 2021-07-03 DIAGNOSIS — W503XXA Accidental bite by another person, initial encounter: Secondary | ICD-10-CM | POA: Diagnosis not present

## 2021-07-03 DIAGNOSIS — M79631 Pain in right forearm: Secondary | ICD-10-CM

## 2021-07-03 DIAGNOSIS — S51851A Open bite of right forearm, initial encounter: Secondary | ICD-10-CM

## 2021-07-03 MED ORDER — AMOXICILLIN-POT CLAVULANATE 875-125 MG PO TABS: 1.0000 | ORAL_TABLET | Freq: Two times a day (BID) | ORAL | 0 refills | Status: AC
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
## 2021-07-03 NOTE — Discharge Instructions (Addendum)
Recommend start Augmentin 875mg  twice a day for 5 days to prevent infection. Take Ibuprofen 600mg  every 8 hours as needed for pain and swelling. Recommend apply ice 3 to 4 times a day for the next 48 hours. May wash area with soap and water and keep covered as needed. Follow-up if any increase in redness, swelling or pain occurs or any discharge occurs.
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
## 2021-07-07 ENCOUNTER — Other Ambulatory Visit: Payer: Self-pay

## 2021-07-07 ENCOUNTER — Encounter (HOSPITAL_COMMUNITY): Payer: Self-pay

## 2021-07-07 ENCOUNTER — Ambulatory Visit (HOSPITAL_COMMUNITY)
Admission: EM | Admit: 2021-07-07 | Discharge: 2021-07-07 | Disposition: A | Discharge status: Home / Self Care | Payer: Worker's Compensation | Attending: Sports Medicine | Admitting: Sports Medicine

## 2021-07-07 DIAGNOSIS — W503XXD Accidental bite by another person, subsequent encounter: Secondary | ICD-10-CM

## 2021-07-07 DIAGNOSIS — M79631 Pain in right forearm: Secondary | ICD-10-CM
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
## 2021-07-07 NOTE — Discharge Instructions (Signed)
Martin [name],  This should continue to heal for you.  Things for you to do: -Continue Augmentin antibiotic until completion -You may alternate ice and warm compress at least 3 times daily for 15-20 minutes -May take ibuprofen or Tylenol for pain  -You should seek reevaluation if there is increasing redness, swelling, or if you have any fever or chills yourself.  The pain should continue to improve, if your pain significantly worsens, would warrant reevaluation here in the urgent care or with your PCP.

## 2021-07-28 ENCOUNTER — Other Ambulatory Visit: Payer: Self-pay | Admitting: Cardiology

## 2021-08-28 ENCOUNTER — Other Ambulatory Visit: Payer: Self-pay | Admitting: Cardiology

## 2021-09-22 ENCOUNTER — Encounter: Payer: Self-pay | Admitting: Cardiology

## 2021-09-22 ENCOUNTER — Ambulatory Visit: Payer: 59 | Admitting: Cardiology

## 2021-09-22 ENCOUNTER — Telehealth: Payer: Self-pay | Admitting: *Deleted

## 2021-09-22 VITALS — BP 130/78 | HR 67 | Ht 73.0 in | Wt 271.0 lb

## 2021-09-22 DIAGNOSIS — G4733 Obstructive sleep apnea (adult) (pediatric): Secondary | ICD-10-CM

## 2021-09-22 DIAGNOSIS — I10 Essential (primary) hypertension: Secondary | ICD-10-CM | POA: Diagnosis not present

## 2021-09-22 DIAGNOSIS — I480 Paroxysmal atrial fibrillation: Secondary | ICD-10-CM

## 2021-09-22 LAB — BASIC METABOLIC PANEL
BUN/Creatinine Ratio: 12 (ref 10–24)
BUN: 18 mg/dL (ref 8–27)
CO2: 30 mmol/L — ABNORMAL HIGH (ref 20–29)
Calcium: 9.9 mg/dL (ref 8.6–10.2)
Chloride: 102 mmol/L (ref 96–106)
Creatinine, Ser: 1.45 mg/dL — ABNORMAL HIGH (ref 0.76–1.27)
Glucose: 115 mg/dL — ABNORMAL HIGH (ref 70–99)
Potassium: 3.9 mmol/L (ref 3.5–5.2)
Sodium: 142 mmol/L (ref 134–144)
eGFR: 54 mL/min/{1.73_m2} — ABNORMAL LOW (ref >59)

## 2021-09-22 MED ORDER — LOSARTAN POTASSIUM 50 MG PO TABS: 100.0000 mg | ORAL_TABLET | Freq: Every day | ORAL | 3 refills | Status: DC

## 2021-09-22 MED ORDER — CHLORTHALIDONE 25 MG PO TABS: 25.0000 mg | ORAL_TABLET | Freq: Every day | ORAL | 3 refills | Status: DC

## 2021-09-22 MED ORDER — CARVEDILOL 25 MG PO TABS: 25.0000 mg | ORAL_TABLET | Freq: Two times a day (BID) | ORAL | 3 refills | Status: DC

## 2021-09-22 MED ORDER — DILTIAZEM HCL ER COATED BEADS 300 MG PO CP24: 300.0000 mg | ORAL_CAPSULE | Freq: Every day | ORAL | 3 refills | Status: DC

## 2021-09-22 NOTE — Telephone Encounter (Signed)
Pt was seen in the office today with Dr. Mayford Knife who ordered an [place] study. Pt aware to not open the box until we call him with the PIN#, once we have authorization from insurance company. Pt states his wife will upload the application on to her phone,. I have given the instructions how to upload the app on the phone.  ?

## 2021-09-22 NOTE — Addendum Note (Signed)
Addended by: Theresia Majors on: 09/22/2021 10:31 AM ? ? Modules accepted: Orders ? ?

## 2021-09-22 NOTE — Addendum Note (Signed)
Addended by: Theresia Majors on: 09/22/2021 10:23 AM ? ? Modules accepted: Orders ? ?

## 2021-09-22 NOTE — Patient Instructions (Signed)
Medication Instructions:  ?Your physician recommends that you continue on your current medications as directed. Please refer to the Current Medication list given to you today. ? ?*If you need a refill on your cardiac medications before your next appointment, please call your pharmacy* ? ?Lab Work: ?TODAY: BMET ?If you have labs (blood work) drawn today and your tests are completely normal, you will receive your results only by: ?MyChart Message (if you have MyChart) OR ?A paper copy in the mail ?If you have any lab test that is abnormal or we need to change your treatment, we will call you to review the results. ? ? ?Testing/Procedures: ?Your physician has recommended that you have a sleep study. This test records several body functions during sleep, including: brain activity, eye movement, oxygen and carbon dioxide blood levels, heart rate and rhythm, breathing rate and rhythm, the flow of air through your mouth and nose, snoring, body muscle movements, and chest and belly movement. ? ?Follow-Up: ?At [facility], you and your health needs are our priority.  As part of our continuing mission to provide you with exceptional heart care, we have created designated Provider Care Teams.  These Care Teams include your primary Cardiologist (physician) and Advanced Practice Providers (APPs -  Physician Assistants and Nurse Practitioners) who all work together to provide you with the care you need, when you need it.  ? ?Your next appointment:   ?1 year(s) ? ?The format for your next appointment:   ?In Person ? ?Provider:   ?[name] Turner, MD   ? ? ?

## 2021-09-22 NOTE — Progress Notes (Signed)
?Cardiology Office Note:   ? ?Date:  09/22/2021  ? ?IDArsenio [name], DOB March 14, 1961, MRN [id number] ? ?PCP:  [name], MD  ?[facility] Cardiologist:  Fransico Him, MD   ?[facility] Electrophysiologist:  None  ? ?Referring MD: [name], MD  ? ?Chief Complaint:  Hypertension, Hyperlipidemia, Atrial Fibrillation, and Sleep Apnea ?  ? ?Patient Profile:   ? ?[facility] [name] is a 61 y.o. male with:  ?Hypertension  ?Hyperlipidemia  ?Hypothyroidism  ?Paroxysmal atrial fibrillation  ?In setting of acute cholecystitis; no anticoagulation - caused by acute illness ?CHA2DS2-VASc=1 (HTN)   ?OSA, on CPAP ?Hx of LGI bleeding  ?Acute PE in setting of COVID 19 now on Xarelto 66m BID ?  ?Prior CV studies: ?Echocardiogram 05/31/2019 ?EF 55-60, mild LVH, no RWMA, normal RVSF, mild MAC, trivial MR, trivial TR ?Echocardiogram 05/27/2020 ?EF 55-60% with mild AVSC ? ?History of Present Illness:   ? ?[place] [name] is a 89yoAAM with a hx of PAF in setting of acute illness and not anticoagulated for afib as it was in setting of acute illness with low CHADS2VASC score but now on Xarelto after PE in setting of COVID 19, HTN, OSA on PAP, HLD and hx of GI BLeed.   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   He had been on PCP therapy but could not tolerate his CPAP and his machine was reclaimed by the DME agency.   ? ?He was set up to repeat a split night sleep study which was done 09/03/2020 showing no significant sleep apnea but patient-for sufficient sleep time. ? ?He is here today for followup and is doing well.  He denies any chest pain or pressure, SOB, DOE, PND, orthopnea, LE edema, dizziness, palpitations or syncope. He is compliant with his meds and is tolerating meds with no SE.    ? ?Past Medical History:  ?Diagnosis Date  ? Cardiomegaly   ? LVH  ? Cataract   ? Dysrhythmia   ? afib  ?  Hemorrhoids   ? Hyperlipidemia   ? Hypertension   ? Hypothyroidism   ? Lower GI bleeding   ? OSA (obstructive sleep apnea)   ? mild OSA with an AHI of 8.1/hr but severe during REM sleep with AHI 43/hr.  He had O2 desats as low as 85%.  ? [place]   ? in setting of acute cholecystitis.  No anticoagulation due to setting of acute illness and CHADS2VAS score of 1  ? ? ?Current Medications: ?Current Meds  ?Medication Sig  ? acetaminophen (TYLENOL) 500 MG tablet Take 500 mg by mouth every 6 (six) hours as needed for moderate pain or headache.  ? carboxymethylcellulose (REFRESH PLUS) 0.5 % SOLN Place 1 drop into both eyes daily as needed (dry eyes).  ? carvedilol (COREG) 25 MG tablet Take 1 tablet by mouth twice daily  ? chlorthalidone (HYGROTON) 25 MG tablet Take 1 tablet by mouth once daily  ? diltiazem (CARDIZEM CD) 300 MG 24 hr capsule Take 1 capsule (300 mg total) by mouth daily. Please schedule yearly appointment for January 2023. Thank you  ? FIBER ADULT GUMMIES PO Take 1 capsule by mouth daily as needed (constipation).  ? losartan (COZAAR) 50 MG tablet Take 2 tablets (100 mg total) by mouth daily. Please keep upcoming appt in April 2023  with [name] before anymore refills. Thank you Final Attempt  ? Multiple Vitamin (MULTIVITAMIN) capsule Take 1 capsule by mouth daily.  ? oxyCODONE (OXY IR/ROXICODONE) 5 MG immediate release tablet Take 1 tablet (5 mg total) by mouth every 6 (six) hours as needed for severe pain.  ? pravastatin (PRAVACHOL) 40 MG tablet Take 1 tablet by mouth once daily  ?  ? ?Allergies:   Garlic and Shellfish allergy  ? ?Social History  ? ?Tobacco Use  ? Smoking status: Never  ? Smokeless tobacco: Never  ?Vaping Use  ? Vaping Use: Never used  ?Substance Use Topics  ? Alcohol use: Yes  ?  Comment: Rare  ? Drug use: No  ?  ? ?Family Hx: ?The patient's family history includes Asthma in his father; Hypertension in his father; Kidney disease in his father and  mother. ? ?ROS  ? ?EKGs/Labs/Other Test Reviewed:   ? ?EKG:  EKG is not ordered today.  The ekg ordered today demonstrates NSR with RBBB ? ?2D echo 05/27/2020 ?IMPRESSIONS  ? ? 1. Left ventricular ejection fraction, by estimation, is 55 to 60%. The  ?left ventricle has normal function. The left ventricle has no regional  ?wall motion abnormalities. Left ventricular diastolic parameters are  ?consistent with Grade I diastolic  ?dysfunction (impaired relaxation).  ? 2. Right ventricular systolic function is normal. The right ventricular  ?size is normal. Tricuspid regurgitation signal is inadequate for assessing  ?PA pressure.  ? 3. The mitral valve is grossly normal. No evidence of mitral valve  ?regurgitation. No evidence of mitral stenosis.  ? 4. Focal calcification noted on the RCC or the AoV. The aortic valve is  ?tricuspid. There is mild calcification of the aortic valve. Aortic valve  ?regurgitation is not visualized. Mild aortic valve sclerosis is present,  ?with no evidence of aortic valve  ?stenosis.  ? 5. The inferior vena cava is normal in size with greater than 50%  ?respiratory variability, suggesting right atrial pressure of 3 mmHg.  ? ?Recent Labs: ?12/31/2020: BUN 15; Creatinine, Ser 1.72; Potassium 3.4; Sodium 139 ?02/25/2021: Hemoglobin 9.2; Platelets 315  ? ?Recent Lipid Panel ?Lab Results  ?Component Value Date/Time  ? CHOL 142 06/11/2020 02:31 PM  ? TRIG 178 (H) 06/11/2020 02:31 PM  ? HDL 38 (L) 06/11/2020 02:31 PM  ? CHOLHDL 3.7 06/11/2020 02:31 PM  ? CHOLHDL 3.0 04/03/2016 07:54 AM  ? LDLCALC 74 06/11/2020 02:31 PM  ? ? ?Physical Exam:   ? ?VS:  BP 130/78   Pulse 67   Ht _0  (1.854 m)   Wt 271 lb (122.9 kg)   SpO2 96%   BMI 35.75 kg/m?    ? ?Wt Readings from Last 3 Encounters:  ?09/22/21 271 lb (122.9 kg)  ?04/30/21 245 lb (111.1 kg)  ?02/25/21 275 lb 12.8 oz (125.1 kg)  ?  ?GEN: Well nourished, well developed in no acute distress ?HEENT: Normal ?NECK: No JVD; No carotid bruits ?LYMPHATICS:  No lymphadenopathy ?CARDIAC:RRR, no murmurs, rubs, gallops ?RESPIRATORY:  Clear to auscultation without rales, wheezing or rhonchi  ?ABDOMEN: Soft, non-tender, non-distended ?MUSCULOSKELETAL:  No edema; No deformity  ?SKIN: Warm and dry ?NEUROLOGIC:  Alert and oriented x 3 ?PSYCHIATRIC:  Normal affect   ?ASSESSMENT & PLAN:   ? ?1. Essential hypertension ?-His BP is well controlled on exam today ?-Continue prescription drug management with Cardizem CD 300 mg daily, chlorthalidone 25 mg daily, losartan 100 mg daily and carvedilol 25 mg twice daily with as needed refills ?-  I have personally reviewed and interpreted outside labs perfo ?-Repeat BMET ? ?2. OSA  ?-he was not compliant with his device and had to turn it in ?-he has a repeat split night sleep study but was nondiagnostic due to insufficient sleep time ?-I will get an [facility] sleep study ? ?3.  PAF ?-He continues to maintain normal sinus rhythm and denies any palpitations ?-Continue prescription drug management carvedilol 25 mg twice daily and Cardizem CD 300 mg daily with as needed refills ?-no indication for long term anticoagulation as CHADS2VASC score is 1 and occurred in setting of acute illness with no reoccurrence.  ? ?Dispo:  1 year ? ?Medication Adjustments/Labs and Tests Ordered: ?Current medicines are reviewed at length with the patient today.  Concerns regarding medicines are outlined above.  ?Tests Ordered: ?Orders Placed This Encounter  ?Procedures  ? EKG 12-Lead  ? ?Medication Changes: ?No orders of the defined types were placed in this encounter. ? ? ?Signed, ?Fransico Him, MD  ?09/22/2021 10:17 AM    ?[facility] ?[address], [place], [place]  [address] ?Phone: [phone]; Fax: [phone]  ? ? ?

## 2021-09-22 NOTE — Telephone Encounter (Signed)
Set up date 09/22/21 ?

## 2021-09-30 NOTE — Telephone Encounter (Signed)
4/11  [facility] READ -APPROVED NO PA REQUIRED ?4/10  PRECERT FAXED TO [employer] ?

## 2021-09-30 NOTE — Telephone Encounter (Signed)
Called and made the patient aware that he may proceed with the [facility] Sleep Study. PIN # provided to the patient. Patient made aware that he will be contacted after the test has been read with the results and any recommendations. Patient verbalized understanding and thanked me for the call.  ? ?Pt said he will do his sleep study this week. PIN # [phone] has been given to the pt.  ?

## 2021-10-15 ENCOUNTER — Other Ambulatory Visit: Payer: Self-pay | Admitting: Medical

## 2021-10-15 DIAGNOSIS — E785 Hyperlipidemia, unspecified: Secondary | ICD-10-CM

## 2022-01-08 ENCOUNTER — Other Ambulatory Visit: Payer: Self-pay | Admitting: Medical

## 2022-01-08 DIAGNOSIS — E785 Hyperlipidemia, unspecified: Secondary | ICD-10-CM

## 2022-01-28 ENCOUNTER — Encounter (INDEPENDENT_AMBULATORY_CARE_PROVIDER_SITE_OTHER): Payer: Self-pay

## 2022-02-25 ENCOUNTER — Encounter: Payer: Self-pay | Admitting: Internal Medicine

## 2022-03-09 ENCOUNTER — Encounter: Payer: Self-pay | Admitting: Family Medicine

## 2022-04-10 ENCOUNTER — Other Ambulatory Visit: Payer: Self-pay | Admitting: Family Medicine

## 2022-04-10 DIAGNOSIS — E785 Hyperlipidemia, unspecified: Secondary | ICD-10-CM

## 2022-04-13 ENCOUNTER — Encounter: Payer: Self-pay | Admitting: Internal Medicine

## 2022-07-07 ENCOUNTER — Other Ambulatory Visit: Payer: Self-pay | Admitting: Family Medicine

## 2022-07-07 DIAGNOSIS — E785 Hyperlipidemia, unspecified: Secondary | ICD-10-CM

## 2022-07-10 ENCOUNTER — Encounter: Payer: Self-pay | Admitting: Family Medicine

## 2022-07-13 ENCOUNTER — Encounter: Payer: Self-pay | Admitting: Family Medicine

## 2022-07-13 ENCOUNTER — Ambulatory Visit (INDEPENDENT_AMBULATORY_CARE_PROVIDER_SITE_OTHER): Payer: 59 | Admitting: Family Medicine

## 2022-07-13 VITALS — BP 140/88 | HR 66 | Temp 97.6°F | Resp 16 | Ht 71.75 in | Wt 272.8 lb

## 2022-07-13 DIAGNOSIS — G4733 Obstructive sleep apnea (adult) (pediatric): Secondary | ICD-10-CM

## 2022-07-13 DIAGNOSIS — I480 Paroxysmal atrial fibrillation: Secondary | ICD-10-CM

## 2022-07-13 DIAGNOSIS — I119 Hypertensive heart disease without heart failure: Secondary | ICD-10-CM | POA: Diagnosis not present

## 2022-07-13 DIAGNOSIS — Z0000 Encounter for general adult medical examination without abnormal findings: Secondary | ICD-10-CM | POA: Diagnosis not present

## 2022-07-13 DIAGNOSIS — Z86010 Personal history of colonic polyps: Secondary | ICD-10-CM | POA: Diagnosis not present

## 2022-07-13 DIAGNOSIS — E785 Hyperlipidemia, unspecified: Secondary | ICD-10-CM | POA: Diagnosis not present

## 2022-07-13 DIAGNOSIS — E039 Hypothyroidism, unspecified: Secondary | ICD-10-CM

## 2022-07-13 DIAGNOSIS — I10 Essential (primary) hypertension: Secondary | ICD-10-CM

## 2022-07-13 DIAGNOSIS — H269 Unspecified cataract: Secondary | ICD-10-CM | POA: Diagnosis not present

## 2022-07-13 DIAGNOSIS — R7989 Other specified abnormal findings of blood chemistry: Secondary | ICD-10-CM | POA: Diagnosis not present

## 2022-07-13 DIAGNOSIS — R7303 Prediabetes: Secondary | ICD-10-CM | POA: Diagnosis not present

## 2022-07-13 DIAGNOSIS — J301 Allergic rhinitis due to pollen: Secondary | ICD-10-CM | POA: Diagnosis not present

## 2022-07-13 DIAGNOSIS — Z87438 Personal history of other diseases of male genital organs: Secondary | ICD-10-CM

## 2022-07-13 DIAGNOSIS — Z86711 Personal history of pulmonary embolism: Secondary | ICD-10-CM

## 2022-07-13 DIAGNOSIS — N528 Other male erectile dysfunction: Secondary | ICD-10-CM | POA: Diagnosis not present

## 2022-07-13 DIAGNOSIS — K649 Unspecified hemorrhoids: Secondary | ICD-10-CM

## 2022-07-13 DIAGNOSIS — Z8616 Personal history of COVID-19: Secondary | ICD-10-CM

## 2022-07-13 DIAGNOSIS — Z23 Encounter for immunization: Secondary | ICD-10-CM

## 2022-07-13 LAB — POCT URINALYSIS DIP (PROADVANTAGE DEVICE)
Bilirubin, UA: NEGATIVE
Blood, UA: NEGATIVE
Glucose, UA: NEGATIVE mg/dL
Ketones, POC UA: NEGATIVE mg/dL
Leukocytes, UA: NEGATIVE
Nitrite, UA: NEGATIVE
Protein Ur, POC: NEGATIVE mg/dL
Specific Gravity, Urine: 1.015
Urobilinogen, Ur: 0.2
pH, UA: 7 (ref 5.0–8.0)

## 2022-07-13 LAB — POCT GLYCOSYLATED HEMOGLOBIN (HGB A1C): Hemoglobin A1C: 6.4 % — AB (ref 4.0–5.6)

## 2022-07-13 LAB — CBC WITH DIFFERENTIAL/PLATELET

## 2022-07-13 LAB — LIPID PANEL

## 2022-07-13 MED ORDER — PRAVASTATIN SODIUM 40 MG PO TABS: 40.0000 mg | ORAL_TABLET | Freq: Every day | ORAL | 3 refills | Status: DC

## 2022-07-13 MED ORDER — DILTIAZEM HCL ER COATED BEADS 300 MG PO CP24: 300.0000 mg | ORAL_CAPSULE | Freq: Every day | ORAL | 3 refills | Status: DC

## 2022-07-13 MED ORDER — CARVEDILOL 25 MG PO TABS: 25.0000 mg | ORAL_TABLET | Freq: Two times a day (BID) | ORAL | 3 refills | Status: DC

## 2022-07-13 MED ORDER — LOSARTAN POTASSIUM-HCTZ 50-12.5 MG PO TABS: 1.0000 | ORAL_TABLET | Freq: Every day | ORAL | 3 refills | Status: DC

## 2022-07-13 NOTE — Patient Instructions (Addendum)
20 minutes of something physical every day or 150 minutes a week something physical.  Look at cutting back on carbohydrates and easiest way to remember that his weight food.  Bread, rice, pasta, potatoes and sugar Stop the losartan(Cozaar) and chlorthalidone

## 2022-07-13 NOTE — Progress Notes (Signed)
   Subjective:    [name], male    DOB: 08-Nov-1960, 62 y.o.   MRN: [id number]  HPI He is here for complete examination.  He does have a previous history of PAF as well as pulmonary embolus but at the present time is not on any Eliquis.  He was taken off of this by his cardiologist.  He does continue on Coreg, high baton, Cardizem and losartan.  He also has a history of OSA and did try CPAP but did not tolerate this.  He has a previous history of elevated TSH on 1 occurrence but follow-ups were all in the normal range.  His allergies seem to be under good control and are mainly in spring and fall.  He does follow-up with ophthalmology concerning the cataract.  He does have a history of BPH as well as ED but presently is on no medication.  He does have a history of colonic polyps and has had a colonoscopy by the [facility] and is scheduled for a 3-year follow-up.  He also has a history of prediabetes.  He does exercise regularly.   Review of Systems  All other systems reviewed and are negative.      Objective:   Physical Exam Alert and in no distress. Tympanic membranes and canals are normal. Pharyngeal area is normal. Neck is supple without adenopathy or thyromegaly. Cardiac exam shows a regular sinus rhythm without murmurs or gallops. Lungs are clear to auscultation. Hemoglobin A1c is 6.4       Assessment & Plan:  Routine general medical examination at a health care facility - Plan: CBC with Differential/Platelet, Comprehensive metabolic panel, Lipid panel, POCT Urinalysis DIP (Proadvantage Device)  Hypertensive left ventricular hypertrophy, without heart failure  Hemorrhoids, unspecified hemorrhoid type  Elevated TSH - Plan: TSH  PAF (paroxysmal atrial fibrillation) (HCC)  Essential hypertension - Plan: CBC with Differential/Platelet, Comprehensive metabolic panel, diltiazem (CARDIZEM CD) 300 MG 24 hr capsule, carvedilol (COREG) 25 MG tablet, losartan-hydrochlorothiazide  (HYZAAR) 50-12.5 MG tablet  Allergic rhinitis due to pollen, unspecified seasonality  OSA (obstructive sleep apnea)  Cataract, unspecified cataract type, unspecified laterality  History of BPH  Hyperlipidemia with target LDL less than 100 - Plan: Lipid panel, pravastatin (PRAVACHOL) 40 MG tablet  History of pulmonary embolus (PE)  Hx of adenomatous colonic polyps - Plan: CBC with Differential/Platelet  Other male erectile dysfunction - Plan: CBC with Differential/Platelet, Comprehensive metabolic panel  History of COVID-19  Need for COVID-19 vaccine - Plan: Pfizer Fall 2023 Covid-19 Vaccine 59yrs and older  Need for influenza vaccination  Pre-diabetes - Plan: POCT glycosylated hemoglobin (Hb A1C) His medications were readjusted to try and minimize the pill load.  I will switch him to Hyzaar and have him stop losartan and chlorthalidone.  Also follow-up on his thyroid as he did have only 1 TSH that was elevated.  He does have underlying OSA but did not tolerate CPAP.  He will follow-up with the [facility] concerning his colonic polyp.  He states that he is on a 3-year cycle.  He is to see [name] in the near future and I will have her follow-up on his blood pressure.  I then discussed diet and exercise with him and his risk for diabetes as he is now at 6.4 hemoglobin A1c.

## 2022-07-14 LAB — CBC WITH DIFFERENTIAL/PLATELET
Basophils Absolute: 0.1 10*3/uL (ref 0.0–0.2)
Basos: 1 %
EOS (ABSOLUTE): 0.1 10*3/uL (ref 0.0–0.4)
Eos: 1 %
Hematocrit: 43.5 % (ref 37.5–51.0)
Hemoglobin: 13.9 g/dL (ref 13.0–17.7)
Immature Grans (Abs): 0 10*3/uL (ref 0.0–0.1)
Immature Granulocytes: 0 %
Lymphocytes Absolute: 1.8 10*3/uL (ref 0.7–3.1)
Lymphs: 28 %
MCH: 29.8 pg (ref 26.6–33.0)
MCHC: 32 g/dL (ref 31.5–35.7)
MCV: 93 fL (ref 79–97)
Monocytes Absolute: 0.5 10*3/uL (ref 0.1–0.9)
Monocytes: 8 %
Neutrophils Absolute: 3.8 10*3/uL (ref 1.4–7.0)
Neutrophils: 62 %
Platelets: 187 10*3/uL (ref 150–450)
RBC: 4.66 x10E6/uL (ref 4.14–5.80)
RDW: 13.6 % (ref 11.6–15.4)
WBC: 6.3 10*3/uL (ref 3.4–10.8)

## 2022-07-14 LAB — COMPREHENSIVE METABOLIC PANEL
ALT: 11 IU/L (ref 0–44)
AST: 11 IU/L (ref 0–40)
Albumin/Globulin Ratio: 1.4 (ref 1.2–2.2)
Albumin: 4.3 g/dL (ref 3.9–4.9)
Alkaline Phosphatase: 63 IU/L (ref 44–121)
BUN/Creatinine Ratio: 12 (ref 10–24)
BUN: 17 mg/dL (ref 8–27)
Bilirubin Total: 1.3 mg/dL — ABNORMAL HIGH (ref 0.0–1.2)
CO2: 26 mmol/L (ref 20–29)
Calcium: 9.7 mg/dL (ref 8.6–10.2)
Chloride: 100 mmol/L (ref 96–106)
Creatinine, Ser: 1.46 mg/dL — ABNORMAL HIGH (ref 0.76–1.27)
Globulin, Total: 3 g/dL (ref 1.5–4.5)
Glucose: 120 mg/dL — ABNORMAL HIGH (ref 70–99)
Potassium: 3.7 mmol/L (ref 3.5–5.2)
Sodium: 141 mmol/L (ref 134–144)
Total Protein: 7.3 g/dL (ref 6.0–8.5)
eGFR: 53 mL/min/{1.73_m2} — ABNORMAL LOW (ref >59)

## 2022-07-14 LAB — LIPID PANEL
Chol/HDL Ratio: 3.1 ratio (ref 0.0–5.0)
Cholesterol, Total: 135 mg/dL (ref 100–199)
HDL: 43 mg/dL (ref >39)
LDL Chol Calc (NIH): 72 mg/dL (ref 0–99)
Triglycerides: 111 mg/dL (ref 0–149)
VLDL Cholesterol Cal: 20 mg/dL (ref 5–40)

## 2022-07-14 LAB — TSH: TSH: 3.99 u[IU]/mL (ref 0.450–4.500)

## 2022-09-07 ENCOUNTER — Encounter: Payer: Self-pay | Admitting: Gastroenterology

## 2022-09-20 ENCOUNTER — Other Ambulatory Visit: Payer: Self-pay

## 2022-09-20 ENCOUNTER — Encounter (HOSPITAL_COMMUNITY): Payer: Self-pay

## 2022-09-20 ENCOUNTER — Emergency Department (HOSPITAL_COMMUNITY)
Admission: EM | Admit: 2022-09-20 | Discharge: 2022-09-20 | Disposition: A | Discharge status: Home / Self Care | Payer: Commercial Managed Care - HMO | Attending: Emergency Medicine | Admitting: Emergency Medicine

## 2022-09-20 ENCOUNTER — Emergency Department (HOSPITAL_COMMUNITY): Payer: Commercial Managed Care - HMO

## 2022-09-20 DIAGNOSIS — I480 Paroxysmal atrial fibrillation: Secondary | ICD-10-CM | POA: Diagnosis not present

## 2022-09-20 DIAGNOSIS — I10 Essential (primary) hypertension: Secondary | ICD-10-CM | POA: Diagnosis not present

## 2022-09-20 DIAGNOSIS — Z1152 Encounter for screening for COVID-19: Secondary | ICD-10-CM | POA: Diagnosis not present

## 2022-09-20 DIAGNOSIS — R0989 Other specified symptoms and signs involving the circulatory and respiratory systems: Secondary | ICD-10-CM | POA: Diagnosis not present

## 2022-09-20 DIAGNOSIS — Z7901 Long term (current) use of anticoagulants: Secondary | ICD-10-CM | POA: Insufficient documentation

## 2022-09-20 DIAGNOSIS — Z79899 Other long term (current) drug therapy: Secondary | ICD-10-CM | POA: Diagnosis not present

## 2022-09-20 DIAGNOSIS — J069 Acute upper respiratory infection, unspecified: Secondary | ICD-10-CM | POA: Diagnosis not present

## 2022-09-20 DIAGNOSIS — Z8616 Personal history of COVID-19: Secondary | ICD-10-CM | POA: Diagnosis not present

## 2022-09-20 DIAGNOSIS — R058 Other specified cough: Secondary | ICD-10-CM | POA: Diagnosis present

## 2022-09-20 DIAGNOSIS — E039 Hypothyroidism, unspecified: Secondary | ICD-10-CM | POA: Insufficient documentation

## 2022-09-20 LAB — CBC
HCT: 41 % (ref 39.0–52.0)
Hemoglobin: 13 g/dL (ref 13.0–17.0)
MCH: 30.5 pg (ref 26.0–34.0)
MCHC: 31.7 g/dL (ref 30.0–36.0)
MCV: 96.2 fL (ref 80.0–100.0)
Platelets: 159 10*3/uL (ref 150–400)
RBC: 4.26 MIL/uL (ref 4.22–5.81)
RDW: 14 % (ref 11.5–15.5)
WBC: 5.1 10*3/uL (ref 4.0–10.5)
nRBC: 0 % (ref 0.0–0.2)

## 2022-09-20 LAB — GROUP A STREP BY PCR: Group A Strep by PCR: NOT DETECTED

## 2022-09-20 LAB — COMPREHENSIVE METABOLIC PANEL
ALT: 16 U/L (ref 0–44)
AST: 15 U/L (ref 15–41)
Albumin: 3.6 g/dL (ref 3.5–5.0)
Alkaline Phosphatase: 42 U/L (ref 38–126)
Anion gap: 10 (ref 5–15)
BUN: 24 mg/dL — ABNORMAL HIGH (ref 8–23)
CO2: 29 mmol/L (ref 22–32)
Calcium: 9.5 mg/dL (ref 8.9–10.3)
Chloride: 104 mmol/L (ref 98–111)
Creatinine, Ser: 1.51 mg/dL — ABNORMAL HIGH (ref 0.61–1.24)
GFR, Estimated: 51 mL/min — ABNORMAL LOW (ref >60)
Glucose, Bld: 131 mg/dL — ABNORMAL HIGH (ref 70–99)
Potassium: 3.4 mmol/L — ABNORMAL LOW (ref 3.5–5.1)
Sodium: 143 mmol/L (ref 135–145)
Total Bilirubin: 0.9 mg/dL (ref 0.3–1.2)
Total Protein: 7 g/dL (ref 6.5–8.1)

## 2022-09-20 LAB — RESP PANEL BY RT-PCR (RSV, FLU A&B, COVID)  RVPGX2
Influenza A by PCR: NEGATIVE
Influenza B by PCR: NEGATIVE
Resp Syncytial Virus by PCR: NEGATIVE
SARS Coronavirus 2 by RT PCR: NEGATIVE

## 2022-09-20 NOTE — ED Provider Notes (Signed)
[place] EMER[facility]ENC[facility] DEPART[facility] AT [facility] Provider Note   CSN: [id number] Arrival date & time: 09/20/22  2049     History  Chief Complaint  Patient presents with   Flu-Like Sypmtoms     [place] [name] is a 62 y.o. male with HTN, HLD, hypothyroidism, hemorrhoids, BPH, history of cholecystectomy, paroxysmal A-fib, OSA, history of PE, who presents with flu-like symptoms.   Pt arrived POV from home for dry cough x1 week, that is unproductive, also reports sore throat. States his "breathing is off, short winded." Mostly dyspnea on exertion, okay when he sits still. He denies CP, fever, chills, or nasal congestion/rhinorrhea, nausea/vomiting/diarrhea/constipation, urinary symptoms, lower extremity edema. No h/o similar. Normal PO intake. No sick contacts.  HPI     Home Medications Prior to Admission medications   Medication Sig Start Date End Date Taking? Authorizing Provider  acetaminophen (TYLENOL) 500 MG tablet Take 500 mg by mouth every 6 (six) hours as needed for moderate pain or headache.    [provider]  carboxymethylcellulose (REFRESH PLUS) 0.5 % SOLN Place 1 drop into both eyes daily as needed (dry eyes).    [provider]  carvedilol (COREG) 25 MG tablet Take 1 tablet (25 mg total) by mouth 2 (two) times daily. 07/13/22   Ronnald Nian, MD  diltiazem (CARDIZEM CD) 300 MG 24 hr capsule Take 1 capsule (300 mg total) by mouth daily. 07/13/22   Ronnald Nian, MD  FIBER ADULT GUMMIES PO Take 1 capsule by mouth daily as needed (constipation).    [provider]  losartan-hydrochlorothiazide (HYZAAR) 50-12.5 MG tablet Take 1 tablet by mouth daily. 07/13/22   Ronnald Nian, MD  Multiple Vitamin (MULTIVITAMIN) capsule Take 1 capsule by mouth daily.    [provider]  oxyCODONE (OXY IR/ROXICODONE) 5 MG immediate release tablet Take 1 tablet (5 mg total) by mouth every 6 (six) hours as needed for severe pain. 01/03/21   Fritzi Mandes, MD  pravastatin (PRAVACHOL) 40 MG tablet Take 1 tablet (40 mg total) by mouth daily. 07/13/22   Ronnald Nian, MD  apixaban (ELIQUIS) 5 MG TABS tablet Take 1 tablet (5 mg total) by mouth 2 (two) times daily. 06/24/20 06/28/20  Ronnald Nian, MD  pantoprazole (PROTONIX) 40 MG tablet Take 1 tablet (40 mg total) by mouth daily. 05/28/20 07/03/20  Elgergawy, Leana Roe, MD      Allergies    Garlic and Shellfish allergy    Review of Systems   Review of Systems Review of systems Negative for f/c.  A 10 point review of systems was performed and is negative unless otherwise reported in HPI.  Physical Exam Updated Vital Signs BP (!) 143/96 (BP Location: Right Arm)   Pulse 80   Temp 98.8 F (37.1 C)   Resp 18   Ht  (1.854 m)   Wt 123.8 kg   SpO2 98%   BMI 36.02 kg/m  Physical Exam General: Normal appearing male, lying in bed.  HEENT: PERRLA, Sclera anicteric, MMM, trachea midline. Clear oropharynx.  Cardiology: RRR, no murmurs/rubs/gallops. BL radial and DP pulses equal bilaterally.  Resp: Normal respiratory rate and effort. CTAB, no wheezes, rhonchi, crackles.  Abd: Soft, non-tender, non-distended. No rebound tenderness or guarding.  GU: Deferred. MSK: No peripheral edema or signs of trauma. Extremities without deformity or TTP. No cyanosis or clubbing. Skin: warm, dry.  Neuro: A&Ox4, CNs II-XII grossly intact. MAEs. Sensation grossly intact.  Psych: Normal mood and  affect.   ED Results / Procedures / Treatments   Labs (all labs ordered are listed, but only abnormal results are displayed) Labs Reviewed  COMPREHENSIVE METABOLIC PANEL - Abnormal; Notable for the following components:      Result Value   Potassium 3.4 (*)    Glucose, Bld 131 (*)    BUN 24 (*)    Creatinine, Ser 1.51 (*)    GFR, Estimated 51 (*)    All other components within normal limits  RESP PANEL BY RT-PCR (RSV, FLU A&B, COVID)  RVPGX2  GROUP A STREP BY PCR  CBC    EKG EKG  Interpretation  Date/Time:  Sunday September 20 2022 21:12:18 EDT Ventricular Rate:  85 PR Interval:  190 QRS Duration: 176 QT Interval:  426 QTC Calculation: 507 R Axis:   -47 Text Interpretation: Sinus rhythm RBBB and LAFB Similar to prior Confirmed by Vivi Barrack [phone]) on 09/20/2022 9:22:38 PM  Radiology No results found.  Procedures Procedures    Medications Ordered in ED Medications - No data to display  ED Course/ Medical Decision Making/ A&P                          Medical Decision Making Amount and/or Complexity of Data Reviewed Labs: ordered. Decision-making details documented in ED Course. Radiology: ordered. Decision-making details documented in ED Course.    This patient presents to the ED for concern of flu-like symptoms and DOE, this involves an extensive number of treatment options, and is a complaint that carries with it a high risk of complications and morbidity.  Patient over all very well-appearing, HDS>  MDM:    DDX for dyspnea includes but is not limited to: Viral URI, bronchitis, pneumonia given report of dry cough/sore throat/DOE. Also consider strep though no tonsillar exudates, swab obtained from triage. No CP/palpitations to suggest arrhythmia or ACS, and EKG is w/ RBBB/LAFB same as prior. No PTX/PNA noted on XR. No peripheral edema or crackles on exam to suggest CHF/pulm edema, none noted on XR. No wheezing to suggest COPD/RAD. No risks for blood clots, cannot PERC out d/t age, but no signs/sxs of DVT, no tachycardia/tachypnea/hypoxia.    Clinical Course as of 09/30/22 1618  Sun Sep 20, 2022  2208 Creatinine(!): 1.51 At apprxoimate baseline [HN]  2208 DG Chest Port 1 View No active disease. [HN]  2217 WBC: 5.1 No leukocytosis [HN]  2217 Hemoglobin: 13.0 No anemia [HN]  2217 Group A Strep by PCR: NOT DETECTED [HN]  2222 Resp panel by RT-PCR (RSV, Flu A&B, Covid) Anterior Nasal Swab Neg [HN]    Clinical Course User Index [HN] Loetta Rough, MD    Labs: I Ordered, and personally interpreted labs.  The pertinent results include:  those listed above  Imaging Studies ordered: I ordered imaging studies including CXR I independently visualized and interpreted imaging. I agree with the radiologist interpretation  Additional history obtained from wife at bedside, chart review.    Social Determinants of Health: Patient lives independently   Disposition: Patient ambulated with pulse ox O2 sat 96 to 98% for the entire time.  He is overall very well-appearing and hemodynamically stable.  Nontoxic.  Patient likely with a viral URI with cough, also consider seasonal allergies.  Labs and imaging are reassuring.  Patient is instructed to take cetirizine daily as well as over-the-counter remedies for cough such as Robitussin or Mucinex.  Advised to follow-up with PCP within 1 week if  symptoms worsen or do not improve. DC w/ discharge instructions/return precautions. All questions answered to patient's satisfaction.    Co morbidities that complicate the patient evaluation  Past Medical History:  Diagnosis Date   Cardiomegaly    LVH   Cataract    Dysrhythmia    afib   Hemorrhoids    Hyperlipidemia    Hypertension    Hypothyroidism    Lower GI bleeding    OSA (obstructive sleep apnea)    mild OSA with an AHI of 8.1/hr but severe during REM sleep with AHI 43/hr.  He had O2 desats as low as 85%.   PAF (paroxysmal atrial fibrillation) (HCC)    in setting of acute cholecystitis.  No anticoagulation due to setting of acute illness and CHADS2VAS score of 1     Medicines No orders of the defined types were placed in this encounter.   I have reviewed the patients home medicines and have made adjustments as needed  Problem List / ED Course: Problem List Items Addressed This Visit   None Visit Diagnoses     Viral URI with cough    -  Primary                   This note was created using dictation software, which  may contain spelling or grammatical errors.    Loetta Rough, MD 09/30/22 [phone]

## 2022-09-20 NOTE — ED Triage Notes (Signed)
Pt arrived POV from home for cough x1 week, that is unproductive, also reports sore throat. SOB intermittently, SOB worse with exertion denies CP, fever, chills, or nasal symptoms. NAD noted, VSS, afebrile.

## 2022-09-20 NOTE — Discharge Instructions (Signed)
Thank you for coming to [facility] Emergency Department. You were seen for cough, sore throat. We did an exam, labs, and imaging, and these showed no acute findings. It is possible you have a viral infection or allergies. Please take cetirizine daily, you can also take over the counter cough remedies such as robitussin or mucinex.  Please follow up with your primary care provider within 1 week if symptoms worsen or do not improve.   Do not hesitate to return to the ED or call 911 if you experience: -Worsening symptoms -Shortness of breath -Chest pain -Lightheadedness, passing out -Fevers/chills -Anything else that concerns you

## 2022-09-20 NOTE — ED Notes (Signed)
Pt ambulated in hallway, no difficulty reported or noted, no assistance needed, O2 sat 96-98% for duration

## 2022-10-27 ENCOUNTER — Ambulatory Visit (INDEPENDENT_AMBULATORY_CARE_PROVIDER_SITE_OTHER): Payer: 59 | Admitting: Family Medicine

## 2022-10-27 ENCOUNTER — Encounter: Payer: Self-pay | Admitting: Family Medicine

## 2022-10-27 VITALS — BP 138/98 | HR 69 | Temp 97.8°F | Resp 16 | Wt 274.2 lb

## 2022-10-27 DIAGNOSIS — R059 Cough, unspecified: Secondary | ICD-10-CM

## 2022-10-27 DIAGNOSIS — I480 Paroxysmal atrial fibrillation: Secondary | ICD-10-CM | POA: Diagnosis not present

## 2022-10-27 DIAGNOSIS — I119 Hypertensive heart disease without heart failure: Secondary | ICD-10-CM

## 2022-10-27 NOTE — Progress Notes (Signed)
   Subjective:    [name], male    DOB: 1960-07-08, 62 y.o.   MRN: [id number]  HPI He is here for consult concerning cough.  He states that he thinks the cough started after he was placed on losartan/HCTZ.  He did go to the emergency room on March 31 and was evaluated for this and they thought it was related to URI.  He continues to have difficulty with cough but no other symptoms.  Does have a previous history of PAF.   Review of Systems     Objective:   Physical Exam Alert and in no distress.  Cardiac exam shows a regular rhythm.  Lungs are clear to auscultation.       Assessment & Plan:  Cough, unspecified type  PAF (paroxysmal atrial fibrillation) (HCC)  Hypertensive left ventricular hypertrophy, without heart failure I will have him stop the medicine for a week to see if the cough goes away explaining that this medication usually does not do that.  If the cough goes away mention the possibility of starting back on the medicine to see if the cough recurs and if so we will need to switch to a different medication.  He was comfortable with that.

## 2022-10-27 NOTE — Patient Instructions (Signed)
Stop taking the losartan and call me in a week.

## 2022-11-02 ENCOUNTER — Encounter: Payer: Self-pay | Admitting: Family Medicine

## 2022-11-03 ENCOUNTER — Telehealth: Payer: 59 | Admitting: Family Medicine

## 2022-11-03 ENCOUNTER — Other Ambulatory Visit: Payer: Self-pay | Admitting: Cardiology

## 2022-11-04 ENCOUNTER — Ambulatory Visit: Payer: 59

## 2022-11-04 ENCOUNTER — Encounter: Payer: Self-pay | Admitting: Medical

## 2022-11-04 ENCOUNTER — Ambulatory Visit (INDEPENDENT_AMBULATORY_CARE_PROVIDER_SITE_OTHER): Payer: 59 | Admitting: Medical

## 2022-11-04 ENCOUNTER — Telehealth: Payer: Self-pay | Admitting: *Deleted

## 2022-11-04 VITALS — BP 120/82 | HR 70 | Temp 98.1°F | Resp 20 | Wt 276.0 lb

## 2022-11-04 DIAGNOSIS — R7301 Impaired fasting glucose: Secondary | ICD-10-CM | POA: Diagnosis not present

## 2022-11-04 DIAGNOSIS — R059 Cough, unspecified: Secondary | ICD-10-CM

## 2022-11-04 DIAGNOSIS — I10 Essential (primary) hypertension: Secondary | ICD-10-CM | POA: Diagnosis not present

## 2022-11-04 LAB — HEMOGLOBIN A1C
Est. average glucose Bld gHb Est-mCnc: 131 mg/dL
Hgb A1c MFr Bld: 6.2 % — ABNORMAL HIGH (ref 4.8–5.6)

## 2022-11-04 MED ORDER — LOSARTAN POTASSIUM 50 MG PO TABS: 50.0000 mg | ORAL_TABLET | Freq: Every day | ORAL | 1 refills | Status: DC

## 2022-11-04 MED ORDER — CHLORTHALIDONE 25 MG PO TABS: 25.0000 mg | ORAL_TABLET | Freq: Every day | ORAL | 1 refills | Status: DC

## 2022-11-04 NOTE — Telephone Encounter (Signed)
Attempt to reach pt for pre-visit. LM with call back #. 

## 2022-11-04 NOTE — Progress Notes (Signed)
Subjective:  Martin [name] is a 62 y.o. male who presents for Chief Complaint  Patient presents with   Follow-up    1 week follow up. Was seen in the office on 10/27/2022 for cough. Losartan/ HCTZ was put on hold.     Here for f/u.  He saw his PCP here Dr. Susann [name] last week for cough.  He had had a cough for a few weeks, dry cough without other symptoms.  He had been seen in the ED for same on 09/20/22.  He feels like the cough started after losartan HCT was added.  He was on Losartan plain prior and seemed to tolerate this.  Home BPs typically run 140/90s.  No chest pain, no edema, no sob, no palpitations  Cough as resolved since a week ago.    He is compliant with his other medicaiton including carvedilol, diltiazem, chlorthalidone.  He is prediabetic.   He denies any current diabetes symptoms, but does endorse drinking a 6 pack of soda weekly including mountain dew or dr pepper.  No other aggravating or relieving factors.    No other c/o.   Past Medical History:  Diagnosis Date   Cardiomegaly    LVH   Cataract    Dysrhythmia    afib   Hemorrhoids    Hyperlipidemia    Hypertension    Hypothyroidism    Lower GI bleeding    OSA (obstructive sleep apnea)    mild OSA with an AHI of 8.1/hr but severe during REM sleep with AHI 43/hr.  He had O2 desats as low as 85%.   PAF (paroxysmal atrial fibrillation) (HCC)    in setting of acute cholecystitis.  No anticoagulation due to setting of acute illness and CHADS2VAS score of 1   Current Outpatient Medications on File Prior to Visit  Medication Sig Dispense Refill   acetaminophen (TYLENOL) 500 MG tablet Take 500 mg by mouth every 6 (six) hours as needed for moderate pain or headache.     carboxymethylcellulose (REFRESH PLUS) 0.5 % SOLN Place 1 drop into both eyes daily as needed (dry eyes).     carvedilol (COREG) 25 MG tablet Take 1 tablet (25 mg total) by mouth 2 (two) times daily. 180 tablet 3   diltiazem (CARDIZEM CD) 300 MG 24  hr capsule Take 1 capsule (300 mg total) by mouth daily. 90 capsule 3   FIBER ADULT GUMMIES PO Take 1 capsule by mouth daily as needed (constipation).     Multiple Vitamin (MULTIVITAMIN) capsule Take 1 capsule by mouth daily.     pravastatin (PRAVACHOL) 40 MG tablet Take 1 tablet (40 mg total) by mouth daily. 90 tablet 3   [DISCONTINUED] apixaban (ELIQUIS) 5 MG TABS tablet Take 1 tablet (5 mg total) by mouth 2 (two) times daily. 180 tablet 1   [DISCONTINUED] pantoprazole (PROTONIX) 40 MG tablet Take 1 tablet (40 mg total) by mouth daily. 30 tablet 0   No current facility-administered medications on file prior to visit.    The following portions of the patient's history were reviewed and updated as appropriate: allergies, current medications, past family history, past medical history, past social history, past surgical history and problem list.  ROS Otherwise as in subjective above  Objective: BP 120/82   Pulse 70   Temp 98.1 F (36.7 C) (Oral)   Resp 20   Wt 276 lb (125.2 kg)   SpO2 96% Comment: room air  BMI 36.41 kg/m   General appearance: alert, no distress, well  developed, well nourished Neck: supple, no lymphadenopathy, no thyromegaly, no masses Heart: RRR, normal S1, S2, no murmurs Lungs: CTA bilaterally, no wheezes, rhonchi, or rales Pulses: 2+ radial pulses, 2+ pedal pulses, normal cap refill Ext: no edema   Assessment: Encounter Diagnoses  Name Primary?   Essential hypertension Yes   Impaired fasting blood sugar    Cough, unspecified type      Plan: Hypertension  Continue coreg 25mg  BID Continue Diltiazem CD 300mg  daily Continue Chlorthalidone 25mg  daily Restart Losartan 50mg .  Discontinue Losartan HCT  Impaired glucose - updated HgbA1C today.  Counseled against soda and high sugars foods and drinks  Cough has resolved from last week We discussed possible causes of his recent cough.  He did not have really any other symptoms.  I really do not suspect the  ARB losartan caused the cough. We discussed avoiding GERD triggers, we discussed allergy symptoms that might prompt other intervention He denies prior history of allergy rhinitis problems in the spring  Follow-up with cardiology, Dr. Mayford [name] soon as scheduled  Martin [name] was seen today for follow-up.  Diagnoses and all orders for this visit:  Essential hypertension  Impaired fasting blood sugar -     Hemoglobin A1c  Cough, unspecified type  Other orders -     losartan (COZAAR) 50 MG tablet; Take 1 tablet (50 mg total) by mouth daily. -     chlorthalidone (HYGROTON) 25 MG tablet; Take 1 tablet (25 mg total) by mouth daily.    Follow up: pending lab

## 2022-11-04 NOTE — Telephone Encounter (Signed)
Wife answered and asked if she could do his pre-visit since pt is at work. Explained HIPAA to pt's wife who stated she understood. Instructed her to have him call the main office # by the end of day to change his pre-visit appointment to a time he can talk or the procedure will be canceled. She stated she understood and took # for him to call back.

## 2022-11-05 NOTE — Progress Notes (Signed)
Results sent through MyChart

## 2022-11-16 ENCOUNTER — Encounter: Payer: Self-pay | Admitting: Cardiology

## 2022-11-23 NOTE — Progress Notes (Deleted)
Cardiology Office Note:    Date:  11/23/2022   ID:  Martin [name], DOB 11/17/1960, MRN [id number]  PCP:  Martin Nian, MD  [facility] Providers Cardiologist:  Martin Magic, MD { Click to update primary MD,subspecialty MD or APP then REFRESH:1}  *** Referring MD: Martin Nian, MD   Chief Complaint:  No chief complaint on file. {Click here for Visit Info    :1}    History of Present Illness:   Martin [name] is a 62 y.o. male with  a hx of PAF in setting of acute illness and not anticoagulated for afib as it was in setting of acute illness with low CHADS2VASC score but now on Xarelto after PE in setting of COVID 19, HTN, OSA on PAP, HLD and hx of GI BLeed.   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   He had been on PCP therapy but could not tolerate his CPAP and his machine was reclaimed by the DME agency.     He was set up to repeat a split night sleep study which was done 09/03/2020 showing no significant sleep apnea but patient insufficient sleep time.      Patient last saw Dr. Mayford Knife 09/22/21 and Itmar sleep study ordered.      Past Medical History:  Diagnosis Date   Cardiomegaly    LVH   Cataract    Dysrhythmia    afib   Hemorrhoids    Hyperlipidemia    Hypertension    Hypothyroidism    Lower GI bleeding    OSA (obstructive sleep apnea)    mild OSA with an AHI of 8.1/hr but severe during REM sleep with AHI 43/hr.  He had O2 desats as low as 85%.   PAF (paroxysmal atrial fibrillation) (HCC)    in setting of acute cholecystitis.  No anticoagulation due to setting of acute illness and CHADS2VAS score of 1   Current Medications: No outpatient medications have been marked as taking for the 11/24/22 encounter (Appointment) with Martin Kief, PA-C.    Allergies:   Garlic and Shellfish allergy   Social History   Tobacco Use    Smoking status: Never   Smokeless tobacco: Never  Vaping Use   Vaping Use: Never used  Substance Use Topics   Alcohol use: Yes    Comment: Rare   Drug use: No    Family Hx: The patient's family history includes Asthma in his father; Hypertension in his father; Kidney disease in his father and mother.  ROS     Physical Exam:    VS:  There were no vitals taken for this visit.    Wt Readings from Last 3 Encounters:  11/04/22 276 lb (125.2 kg)  10/27/22 274 lb 3.2 oz (124.4 kg)  09/20/22 273 lb (123.8 kg)    Physical Exam  GEN: Well nourished, well developed, in no acute distress  HEENT: normal  Neck: no JVD, carotid bruits, or masses Cardiac:RRR; no murmurs, rubs, or gallops  Respiratory:  clear to auscultation bilaterally, normal work of breathing GI: soft, nontender, nondistended, + BS Ext: without cyanosis, clubbing, or edema, Good distal pulses bilaterally MS: no deformity or atrophy  Skin: warm and dry, no rash Neuro:  Alert and Oriented x 3, Strength and sensation are intact Psych: euthymic mood, full affect  EKGs/Labs/Other Test Reviewed:    EKG:  EKG is *** ordered today.  The ekg ordered today demonstrates ***  Recent Labs: 07/13/2022: TSH 3.990 09/20/2022: ALT 16; BUN 24; Creatinine, Ser 1.51; Hemoglobin 13.0; Platelets 159; Potassium 3.4; Sodium 143   Recent Lipid Panel Recent Labs    07/13/22 1024  CHOL 135  TRIG 111  HDL 43  LDLCALC 72     Prior CV Studies: {Select studies to display:[id number]}    2D echo 05/27/2020 IMPRESSIONS    1. Left ventricular ejection fraction, by estimation, is 55 to 60%. The  left ventricle has normal function. The left ventricle has no regional  wall motion abnormalities. Left ventricular diastolic parameters are  consistent with Grade I diastolic  dysfunction (impaired relaxation).   2. Right ventricular systolic function is normal. The right ventricular  size is normal. Tricuspid regurgitation signal is  inadequate for assessing  PA pressure.   3. The mitral valve is grossly normal. No evidence of mitral valve  regurgitation. No evidence of mitral stenosis.   4. Focal calcification noted on the RCC or the AoV. The aortic valve is  tricuspid. There is mild calcification of the aortic valve. Aortic valve  regurgitation is not visualized. Mild aortic valve sclerosis is present,  with no evidence of aortic valve  stenosis.   5. The inferior vena cava is normal in size with greater than 50%  respiratory variability, suggesting right atrial pressure of 3 mmHg.     Risk Assessment/Calculations/Metrics:   {Does this patient have ATRIAL FIBRILLATION?:[phone]} STOP-Bang Score:     { Consider Dx Sleep Disordered Breathing or Sleep Apnea  ICD G47.33          :1}    No BP recorded.  {Refresh Note OR Click here to enter BP  :1}***    ASSESSMENT & PLAN:   No problem-specific Assessment & Plan notes found for this encounter.   . Essential hypertension -His BP is well controlled on exam today -Continue prescription drug management with Cardizem CD 300 mg daily, chlorthalidone 25 mg daily, losartan 100 mg daily and carvedilol 25 mg twice daily with as needed refills -I have personally reviewed and interpreted outside labs perfo -Repeat BMET   2. OSA  -he was not compliant with his device and had to turn it in -he has a repeat split night sleep study but was nondiagnostic due to insufficient sleep time -I will get an Itamar sleep study   3.  PAF -He continues to maintain normal sinus rhythm and denies any palpitations -Continue prescription drug management carvedilol 25 mg twice daily and Cardizem CD 300 mg daily with as needed refills -no indication for long term anticoagulation as CHADS2VASC score is 1 and occurred in setting of acute illness with no reoccurrence.           {Are you ordering a CV Procedure (e.g. stress test, cath, DCCV, TEE, etc)?   Press F2        :[id number]}   Dispo:   No follow-ups on file.   Medication Adjustments/Labs and Tests Ordered: Current medicines are reviewed at length with the patient today.  Concerns regarding medicines are outlined above.  Tests Ordered: No orders of the defined types were placed in this encounter.  Medication Changes: No orders of the defined types were placed in this encounter.  Signed, Jacolyn Reedy, PA-C  11/23/2022 12:22 PM    [facility] [address] [place], [place], [place]  [address] Phone: [phone]; Fax: [phone]   

## 2022-11-24 ENCOUNTER — Ambulatory Visit: Payer: 59 | Admitting: Physician Assistant

## 2022-11-29 NOTE — Progress Notes (Deleted)
Office Visit    Patient Name: Martin [name] Date of Encounter: 11/29/2022  Primary Care Provider:  Ronnald Nian, MD Primary Cardiologist:  Armanda Magic, MD Primary Electrophysiologist: None   Past Medical History    Past Medical History:  Diagnosis Date   Cardiomegaly    LVH   Cataract    Dysrhythmia    afib   Hemorrhoids    Hyperlipidemia    Hypertension    Hypothyroidism    Lower GI bleeding    OSA (obstructive sleep apnea)    mild OSA with an AHI of 8.1/hr but severe during REM sleep with AHI 43/hr.  He had O2 desats as low as 85%.   PAF (paroxysmal atrial fibrillation) (HCC)    in setting of acute cholecystitis.  No anticoagulation due to setting of acute illness and CHADS2VAS score of 1   Past Surgical History:  Procedure Laterality Date   CHOLECYSTECTOMY N/A 06/01/2019   Procedure: LAPAROSCOPIC CHOLECYSTECTOMY;  Surgeon: Manus Rudd, MD;  Location: [facility] OR;  Service: General;  Laterality: N/A;   DOPPLER ECHOCARDIOGRAPHY     HEMORRHOID SURGERY N/A 01/03/2021   Procedure: HEMORRHOIDECTOMY;  Surgeon: Fritzi Mandes, MD;  Location: [facility] OR;  Service: General;  Laterality: N/A;   HERRIODHOIDEECTOMY  10/02/2020   SEE NOTE IN CHART   RECTAL EXAM UNDER ANESTHESIA N/A 01/03/2021   Procedure: RECTAL EXAM UNDER ANESTHESIA;  Surgeon: Fritzi Mandes, MD;  Location: [facility] OR;  Service: General;  Laterality: N/A;    Allergies  Allergies  Allergen Reactions   Garlic Swelling   Shellfish Allergy Swelling     History of Present Illness    Martin [name]  is a 62 year old male with a PMH of HTN, HLD, hypothyroidism, OSA (on CPAP), PAF, history of GI bleed who presents today for overdue follow-up.  [name] was seen initially in 2017 by Dr. Antoine Poche for management of HTN.  He is currently followed by Dr. Mayford Knife.  He was admitted 05/2019 with AF with RVR in the setting of acute cholecystitis.  He converted to sinus rhythm on Cardizem and Lopressor and was not  discharged due to GI bleed and chads Vascor 1.  He underwent a sleep study and was started on CPAP and 2021.  He continued to have difficulty with managing his BP.  He developed acute PE due to COVID-19 complications and was started on Xarelto which was discontinued after completing therapy and frequent GI bleeding.  He was last seen by Dr.Turner on 09/22/2021 and blood pressure was noted to be controlled.  He was maintaining sinus rhythm with carvedilol and Cardizem.   Since last being seen in the office patient reports***.  Patient denies chest pain, palpitations, dyspnea, PND, orthopnea, nausea, vomiting, dizziness, syncope, edema, weight gain, or early satiety.     ***Notes:  Home Medications    Current Outpatient Medications  Medication Sig Dispense Refill   acetaminophen (TYLENOL) 500 MG tablet Take 500 mg by mouth every 6 (six) hours as needed for moderate pain or headache.     carboxymethylcellulose (REFRESH PLUS) 0.5 % SOLN Place 1 drop into both eyes daily as needed (dry eyes).     carvedilol (COREG) 25 MG tablet Take 1 tablet (25 mg total) by mouth 2 (two) times daily. 180 tablet 3   chlorthalidone (HYGROTON) 25 MG tablet Take 1 tablet (25 mg total) by mouth daily. 30 tablet 1   diltiazem (CARDIZEM CD) 300 MG 24 hr capsule Take 1 capsule (  300 mg total) by mouth daily. 90 capsule 3   FIBER ADULT GUMMIES PO Take 1 capsule by mouth daily as needed (constipation).     losartan (COZAAR) 50 MG tablet Take 1 tablet (50 mg total) by mouth daily. 30 tablet 1   Multiple Vitamin (MULTIVITAMIN) capsule Take 1 capsule by mouth daily.     pravastatin (PRAVACHOL) 40 MG tablet Take 1 tablet (40 mg total) by mouth daily. 90 tablet 3   No current facility-administered medications for this visit.     Review of Systems  Please see the history of present illness.    (+)*** (+)***  All other systems reviewed and are otherwise negative except as noted above.  Physical Exam    Wt Readings from  Last 3 Encounters:  11/04/22 276 lb (125.2 kg)  10/27/22 274 lb 3.2 oz (124.4 kg)  09/20/22 273 lb (123.8 kg)   ZO:XWRUE were no vitals filed for this visit.,There is no height or weight on file to calculate BMI.  Constitutional:      Appearance: Healthy appearance. Not in distress.  Neck:     Vascular: JVD normal.  Pulmonary:     Effort: Pulmonary effort is normal.     Breath sounds: No wheezing. No rales. Diminished in the bases Cardiovascular:     Normal rate. Regular rhythm. Normal S1. Normal S2.      Murmurs: There is no murmur.  Edema:    Peripheral edema absent.  Abdominal:     Palpations: Abdomen is soft non tender. There is no hepatomegaly.  Skin:    General: Skin is warm and dry.  Neurological:     General: No focal deficit present.     Mental Status: Alert and oriented to person, place and time.     Cranial Nerves: Cranial nerves are intact.  EKG/LABS/ Recent Cardiac Studies    ECG personally reviewed by me today - ***  Cardiac Studies & Procedures       ECHOCARDIOGRAM  ECHOCARDIOGRAM COMPLETE 05/27/2020  Narrative ECHOCARDIOGRAM REPORT    Patient Name:   Martin [name] Date of Exam: 05/27/2020 Medical Rec #:  [id number]         Height:       72.0 in Accession #:    [id number]        Weight:       265.0 lb Date of Birth:  [birth year]          BSA:          2.401 m Patient Age:    59 years          BP:           132/89 mmHg Patient Gender: M                 HR:           87 bpm. Exam Location:  Inpatient  Procedure: 2D Echo, Cardiac Doppler and Color Doppler  Indications:    R07.9* Chest pain, unspecified  History:        Patient has prior history of Echocardiogram examinations, most recent 05/31/2019. Cardiomegaly, Arrythmias:Atrial Fibrillation; Risk Factors:Hypertension, Dyslipidemia and Sleep Apnea. COVID-19 Positive. Hypothyroidism.  Sonographer:    Tiffany Dance Referring Phys: [id number] Angie Fava  IMPRESSIONS   1. Left ventricular  ejection fraction, by estimation, is 55 to 60%. The left ventricle has normal function. The left ventricle has no regional wall motion abnormalities. Left ventricular diastolic parameters are consistent with Grade I diastolic  dysfunction (impaired relaxation). 2. Right ventricular systolic function is normal. The right ventricular size is normal. Tricuspid regurgitation signal is inadequate for assessing PA pressure. 3. The mitral valve is grossly normal. No evidence of mitral valve regurgitation. No evidence of mitral stenosis. 4. Focal calcification noted on the RCC or the AoV. The aortic valve is tricuspid. There is mild calcification of the aortic valve. Aortic valve regurgitation is not visualized. Mild aortic valve sclerosis is present, with no evidence of aortic valve stenosis. 5. The inferior vena cava is normal in size with greater than 50% respiratory variability, suggesting right atrial pressure of 3 mmHg.  Comparison(s): No significant change from prior study.  FINDINGS Left Ventricle: Left ventricular ejection fraction, by estimation, is 55 to 60%. The left ventricle has normal function. The left ventricle has no regional wall motion abnormalities. The left ventricular internal cavity size was normal in size. There is no left ventricular hypertrophy. Left ventricular diastolic parameters are consistent with Grade I diastolic dysfunction (impaired relaxation).  Right Ventricle: The right ventricular size is normal. No increase in right ventricular wall thickness. Right ventricular systolic function is normal. Tricuspid regurgitation signal is inadequate for assessing PA pressure.  Left Atrium: Left atrial size was normal in size.  Right Atrium: Right atrial size was normal in size.  Pericardium: Trivial pericardial effusion is present. Presence of pericardial fat pad.  Mitral Valve: The mitral valve is grossly normal. No evidence of mitral valve regurgitation. No evidence of mitral  valve stenosis.  Tricuspid Valve: The tricuspid valve is grossly normal. Tricuspid valve regurgitation is not demonstrated. No evidence of tricuspid stenosis.  Aortic Valve: Focal calcification noted on the RCC or the AoV. The aortic valve is tricuspid. There is mild calcification of the aortic valve. Aortic valve regurgitation is not visualized. Mild aortic valve sclerosis is present, with no evidence of aortic valve stenosis.  Pulmonic Valve: The pulmonic valve was grossly normal. Pulmonic valve regurgitation is not visualized. No evidence of pulmonic stenosis.  Aorta: The aortic root and ascending aorta are structurally normal, with no evidence of dilitation.  Venous: The inferior vena cava is normal in size with greater than 50% respiratory variability, suggesting right atrial pressure of 3 mmHg.  IAS/Shunts: The atrial septum is grossly normal.   LEFT VENTRICLE PLAX 2D LVIDd:         4.90 cm  Diastology LVIDs:         3.40 cm  LV e' medial:    5.22 cm/s LV PW:         1.10 cm  LV E/e' medial:  8.5 LV IVS:        1.40 cm  LV e' lateral:   8.16 cm/s LVOT diam:     2.70 cm  LV E/e' lateral: 5.5 LV SV:         49 LV SV Index:   20 LVOT Area:     5.73 cm   RIGHT VENTRICLE             IVC RV Basal diam:  2.90 cm     IVC diam: 2.10 cm RV S prime:     11.40 cm/s TAPSE (M-mode): 1.7 cm  LEFT ATRIUM             Index       RIGHT ATRIUM           Index LA diam:        3.00 cm 1.25 cm/m  RA Area:  18.50 cm LA Vol (A2C):   53.8 ml 22.41 ml/m RA Volume:   51.90 ml  21.61 ml/m LA Vol (A4C):   19.6 ml 8.16 ml/m LA Biplane Vol: 33.8 ml 14.08 ml/m AORTIC VALVE LVOT Vmax:   53.15 cm/s LVOT Vmean:  36.800 cm/s LVOT VTI:    0.085 m  AORTA Ao Root diam: 3.80 cm Ao Asc diam:  3.70 cm  MITRAL VALVE MV Area (PHT): 1.93 cm    SHUNTS MV Decel Time: 394 msec    Systemic VTI:  0.08 m MV E velocity: 44.60 cm/s  Systemic Diam: 2.70 cm MV A velocity: 38.10 cm/s MV E/A ratio:   1.17  Lennie Odor MD Electronically signed by Lennie Odor MD Signature Date/Time: 05/27/2020/3:08:51 PM    Final             Risk Assessment/Calculations:   {Does this patient have ATRIAL FIBRILLATION?:[phone]}  STOP-Bang Score:     { Consider Dx Sleep Disordered Breathing or Sleep Apnea  ICD G47.33          :1}      Lab Results  Component Value Date   WBC 5.1 09/20/2022   HGB 13.0 09/20/2022   HCT 41.0 09/20/2022   MCV 96.2 09/20/2022   PLT 159 09/20/2022   Lab Results  Component Value Date   CREATININE 1.51 (H) 09/20/2022   BUN 24 (H) 09/20/2022   NA 143 09/20/2022   K 3.4 (L) 09/20/2022   CL 104 09/20/2022   CO2 29 09/20/2022   Lab Results  Component Value Date   ALT 16 09/20/2022   AST 15 09/20/2022   ALKPHOS 42 09/20/2022   BILITOT 0.9 09/20/2022   Lab Results  Component Value Date   CHOL 135 07/13/2022   HDL 43 07/13/2022   LDLCALC 72 07/13/2022   TRIG 111 07/13/2022   CHOLHDL 3.1 07/13/2022    Lab Results  Component Value Date   HGBA1C 6.2 (H) 11/04/2022     Assessment & Plan    1.  Essential hypertension: -Patient's blood pressure today was*** -Continue***  2.  Paroxysmal atrial fibrillation: -Current in the setting of acute cholecystitis with no recurrence and no anticoagulation indicated.  3.  History of PE: -s/p acute PE following COVID-19 with subsequent Xarelto which was discontinued following therapy  4.  Hyperlipidemia: -Patient/LDL cholesterol was*** -Continue***      Disposition: Follow-up with Armanda Magic, MD or APP in *** months {Are you ordering a CV Procedure (e.g. stress test, cath, DCCV, TEE, etc)?   Press F2        :[id number]}   Medication Adjustments/Labs and Tests Ordered: Current medicines are reviewed at length with the patient today.  Concerns regarding medicines are outlined above.   Signed, Napoleon Form, Leodis Rains, NP 11/29/2022, 5:21 PM [facility]

## 2022-11-30 ENCOUNTER — Ambulatory Visit: Payer: 59 | Attending: Physician Assistant | Admitting: Nurse Practitioner

## 2022-11-30 DIAGNOSIS — G4733 Obstructive sleep apnea (adult) (pediatric): Secondary | ICD-10-CM

## 2022-11-30 DIAGNOSIS — E785 Hyperlipidemia, unspecified: Secondary | ICD-10-CM

## 2022-11-30 DIAGNOSIS — I10 Essential (primary) hypertension: Secondary | ICD-10-CM

## 2022-11-30 DIAGNOSIS — Z86711 Personal history of pulmonary embolism: Secondary | ICD-10-CM

## 2022-11-30 DIAGNOSIS — I480 Paroxysmal atrial fibrillation: Secondary | ICD-10-CM

## 2022-12-03 ENCOUNTER — Encounter: Payer: Self-pay | Admitting: Gastroenterology

## 2022-12-08 NOTE — Progress Notes (Unsigned)
Office Visit    Patient Name: Martin [name] Date of Encounter: 12/09/2022  Primary Care Provider:  Ronnald Nian, MD Primary Cardiologist:  Armanda Magic, MD Primary Electrophysiologist: None   Past Medical History    Past Medical History:  Diagnosis Date   Cardiomegaly    LVH   Cataract    Dysrhythmia    afib   Hemorrhoids    Hyperlipidemia    Hypertension    Hypothyroidism    Lower GI bleeding    OSA (obstructive sleep apnea)    mild OSA with an AHI of 8.1/hr but severe during REM sleep with AHI 43/hr.  He had O2 desats as low as 85%.   PAF (paroxysmal atrial fibrillation) (HCC)    in setting of acute cholecystitis.  No anticoagulation due to setting of acute illness and CHADS2VAS score of 1   Past Surgical History:  Procedure Laterality Date   CHOLECYSTECTOMY N/A 06/01/2019   Procedure: LAPAROSCOPIC CHOLECYSTECTOMY;  Surgeon: Manus Rudd, MD;  Location: [facility] OR;  Service: General;  Laterality: N/A;   DOPPLER ECHOCARDIOGRAPHY     HEMORRHOID SURGERY N/A 01/03/2021   Procedure: HEMORRHOIDECTOMY;  Surgeon: Fritzi Mandes, MD;  Location: [facility] OR;  Service: General;  Laterality: N/A;   HERRIODHOIDEECTOMY  10/02/2020   SEE NOTE IN CHART   RECTAL EXAM UNDER ANESTHESIA N/A 01/03/2021   Procedure: RECTAL EXAM UNDER ANESTHESIA;  Surgeon: Fritzi Mandes, MD;  Location: [facility] OR;  Service: General;  Laterality: N/A;    Allergies  Allergies  Allergen Reactions   Garlic Swelling   Shellfish Allergy Swelling     History of Present Illness    Martin [name]  is a 62 year old male with a PMH of HTN, HLD, hypothyroidism, OSA (on CPAP), PAF, history of GI bleed who presents today for overdue follow-up.   [name] was seen initially in 2017 by Dr. Antoine [name] for management of HTN.  He is currently followed by Dr. Mayford [name].  He was admitted 05/2019 with AF with RVR in the setting of acute cholecystitis.  He converted to sinus rhythm on Cardizem and Lopressor and was not  discharged due to GI bleed and chads Vascor 1.  He underwent a sleep study and was started on CPAP and 2021.  He continued to have difficulty with managing his BP.  He developed acute PE due to COVID-19 complications and was started on Xarelto which was discontinued after completing therapy and frequent GI bleeding.  He was last seen by Martin [name] on 09/22/2021 and blood pressure was noted to be controlled.  He was maintaining sinus rhythm with carvedilol and Cardizem.   Martin [name] presents today for 1 year follow-up alone.  Since last being seen in the office patient reports that he has been doing well with no new cardiac complaints.  His blood pressure today was controlled at 120/72 and heart rate was 76 bpm.  He recently had adjustments made to his previous regimen with HCTZ being discontinued due to frequent cough.  Today patient reports no recurrence of cough and is tolerating his medications without any adverse reactions.  He is euvolemic on examination today and is down 6 pounds from previous visit.  He reports staying active and is currently exercising at [employer] 3 days a week.  He is also watching his sodium intake and is working on moderating his sugar intake and increasing his vegetable intake.  Patient denies chest pain, palpitations, dyspnea, PND, orthopnea, nausea, vomiting, dizziness, syncope, edema, weight  gain, or early satiety.  Home Medications    Current Outpatient Medications  Medication Sig Dispense Refill   acetaminophen (TYLENOL) 500 MG tablet Take 500 mg by mouth every 6 (six) hours as needed for moderate pain or headache.     carboxymethylcellulose (REFRESH PLUS) 0.5 % SOLN Place 1 drop into both eyes daily as needed (dry eyes).     carvedilol (COREG) 25 MG tablet Take 1 tablet (25 mg total) by mouth 2 (two) times daily. 180 tablet 3   chlorthalidone (HYGROTON) 25 MG tablet Take 1 tablet (25 mg total) by mouth daily. 30 tablet 1   diltiazem (CARDIZEM CD) 300 MG 24 hr capsule  Take 1 capsule (300 mg total) by mouth daily. 90 capsule 3   FIBER ADULT GUMMIES PO Take 1 capsule by mouth daily as needed (constipation).     losartan (COZAAR) 50 MG tablet Take 1 tablet (50 mg total) by mouth daily. 30 tablet 1   Multiple Vitamin (MULTIVITAMIN) capsule Take 1 capsule by mouth daily.     pravastatin (PRAVACHOL) 40 MG tablet Take 1 tablet (40 mg total) by mouth daily. 90 tablet 3   No current facility-administered medications for this visit.     Review of Systems  Please see the history of present illness.    All other systems reviewed and are otherwise negative except as noted above.  Physical Exam    Wt Readings from Last 3 Encounters:  12/09/22 270 lb 12.8 oz (122.8 kg)  11/04/22 276 lb (125.2 kg)  10/27/22 274 lb 3.2 oz (124.4 kg)   VS: Vitals:   12/09/22 0842  BP: 120/70  Pulse: 76  SpO2: 96%  ,Body mass index is 35.73 kg/m.  Constitutional:      Appearance: Healthy appearance. Not in distress.  Neck:     Vascular: JVD normal.  Pulmonary:     Effort: Pulmonary effort is normal.     Breath sounds: No wheezing. No rales. Diminished in the bases Cardiovascular:     Normal rate. Regular rhythm. Normal S1. Normal S2.      Murmurs: There is no murmur.  Edema:    Peripheral edema absent.  Abdominal:     Palpations: Abdomen is soft non tender. There is no hepatomegaly.  Skin:    General: Skin is warm and dry.  Neurological:     General: No focal deficit present.     Mental Status: Alert and oriented to person, place and time.     Cranial Nerves: Cranial nerves are intact.  EKG/LABS/ Recent Cardiac Studies    ECG personally reviewed by me today -none completed today  Cardiac Studies & Procedures       ECHOCARDIOGRAM  ECHOCARDIOGRAM COMPLETE 05/27/2020  Narrative ECHOCARDIOGRAM REPORT    Patient Name:   Martin [name] Date of Exam: 05/27/2020 Medical Rec #:  [id number]         Height:       72.0 in Accession #:    [id number]         Weight:       265.0 lb Date of Birth:  11-Dec-1960          BSA:          2.401 m Patient Age:    62 years          BP:           132/89 mmHg Patient Gender: M  HR:           87 bpm. Exam Location:  Inpatient  Procedure: 2D Echo, Cardiac Doppler and Color Doppler  Indications:    R07.9* Chest pain, unspecified  History:        Patient has prior history of Echocardiogram examinations, most recent 05/31/2019. Cardiomegaly, Arrythmias:Atrial Fibrillation; Risk Factors:Hypertension, Dyslipidemia and Sleep Apnea. COVID-19 Positive. Hypothyroidism.  Sonographer:    Tiffany Dance Referring Phys: [id number] Angie Fava  IMPRESSIONS   1. Left ventricular ejection fraction, by estimation, is 55 to 60%. The left ventricle has normal function. The left ventricle has no regional wall motion abnormalities. Left ventricular diastolic parameters are consistent with Grade I diastolic dysfunction (impaired relaxation). 2. Right ventricular systolic function is normal. The right ventricular size is normal. Tricuspid regurgitation signal is inadequate for assessing PA pressure. 3. The mitral valve is grossly normal. No evidence of mitral valve regurgitation. No evidence of mitral stenosis. 4. Focal calcification noted on the RCC or the AoV. The aortic valve is tricuspid. There is mild calcification of the aortic valve. Aortic valve regurgitation is not visualized. Mild aortic valve sclerosis is present, with no evidence of aortic valve stenosis. 5. The inferior vena cava is normal in size with greater than 50% respiratory variability, suggesting right atrial pressure of 3 mmHg.  Comparison(s): No significant change from prior study.  FINDINGS Left Ventricle: Left ventricular ejection fraction, by estimation, is 55 to 60%. The left ventricle has normal function. The left ventricle has no regional wall motion abnormalities. The left ventricular internal cavity size was normal in size. There  is no left ventricular hypertrophy. Left ventricular diastolic parameters are consistent with Grade I diastolic dysfunction (impaired relaxation).  Right Ventricle: The right ventricular size is normal. No increase in right ventricular wall thickness. Right ventricular systolic function is normal. Tricuspid regurgitation signal is inadequate for assessing PA pressure.  Left Atrium: Left atrial size was normal in size.  Right Atrium: Right atrial size was normal in size.  Pericardium: Trivial pericardial effusion is present. Presence of pericardial fat pad.  Mitral Valve: The mitral valve is grossly normal. No evidence of mitral valve regurgitation. No evidence of mitral valve stenosis.  Tricuspid Valve: The tricuspid valve is grossly normal. Tricuspid valve regurgitation is not demonstrated. No evidence of tricuspid stenosis.  Aortic Valve: Focal calcification noted on the RCC or the AoV. The aortic valve is tricuspid. There is mild calcification of the aortic valve. Aortic valve regurgitation is not visualized. Mild aortic valve sclerosis is present, with no evidence of aortic valve stenosis.  Pulmonic Valve: The pulmonic valve was grossly normal. Pulmonic valve regurgitation is not visualized. No evidence of pulmonic stenosis.  Aorta: The aortic root and ascending aorta are structurally normal, with no evidence of dilitation.  Venous: The inferior vena cava is normal in size with greater than 50% respiratory variability, suggesting right atrial pressure of 3 mmHg.  IAS/Shunts: The atrial septum is grossly normal.   LEFT VENTRICLE PLAX 2D LVIDd:         4.90 cm  Diastology LVIDs:         3.40 cm  LV e' medial:    5.22 cm/s LV PW:         1.10 cm  LV E/e' medial:  8.5 LV IVS:        1.40 cm  LV e' lateral:   8.16 cm/s LVOT diam:     2.70 cm  LV E/e' lateral: 5.5 LV SV:  49 LV SV Index:   20 LVOT Area:     5.73 cm   RIGHT VENTRICLE             IVC RV Basal diam:  2.90  cm     IVC diam: 2.10 cm RV S prime:     11.40 cm/s TAPSE (M-mode): 1.7 cm  LEFT ATRIUM             Index       RIGHT ATRIUM           Index LA diam:        3.00 cm 1.25 cm/m  RA Area:     18.50 cm LA Vol (A2C):   53.8 ml 22.41 ml/m RA Volume:   51.90 ml  21.61 ml/m LA Vol (A4C):   19.6 ml 8.16 ml/m LA Biplane Vol: 33.8 ml 14.08 ml/m AORTIC VALVE LVOT Vmax:   53.15 cm/s LVOT Vmean:  36.800 cm/s LVOT VTI:    0.085 m  AORTA Ao Root diam: 3.80 cm Ao Asc diam:  3.70 cm  MITRAL VALVE MV Area (PHT): 1.93 cm    SHUNTS MV Decel Time: 394 msec    Systemic VTI:  0.08 m MV E velocity: 44.60 cm/s  Systemic Diam: 2.70 cm MV A velocity: 38.10 cm/s MV E/A ratio:  1.17  Lennie Odor MD Electronically signed by Lennie Odor MD Signature Date/Time: 05/27/2020/3:08:51 PM    Final             Risk Assessment/Calculations:      STOP-Bang Score:           Lab Results  Component Value Date   WBC 5.1 09/20/2022   HGB 13.0 09/20/2022   HCT 41.0 09/20/2022   MCV 96.2 09/20/2022   PLT 159 09/20/2022   Lab Results  Component Value Date   CREATININE 1.51 (H) 09/20/2022   BUN 24 (H) 09/20/2022   NA 143 09/20/2022   K 3.4 (L) 09/20/2022   CL 104 09/20/2022   CO2 29 09/20/2022   Lab Results  Component Value Date   ALT 16 09/20/2022   AST 15 09/20/2022   ALKPHOS 42 09/20/2022   BILITOT 0.9 09/20/2022   Lab Results  Component Value Date   CHOL 135 07/13/2022   HDL 43 07/13/2022   LDLCALC 72 07/13/2022   TRIG 111 07/13/2022   CHOLHDL 3.1 07/13/2022    Lab Results  Component Value Date   HGBA1C 6.2 (H) 11/04/2022     Assessment & Plan    1.  Essential hypertension: -Patient's blood pressure today was well-controlled today at 120/70 -Continue carvedilol 25 mg twice daily, chlorthalidone 25 mg daily, Cardizem 300 mg daily, losartan 50 mg daily -We will check BMET today -Patient advised to continue exercise and low-sodium diet   2.  Paroxysmal atrial  fibrillation: -Current in the setting of acute cholecystitis with no recurrence and no anticoagulation indicated. -Continue Cardizem 300 mg daily   3.  History of PE: -s/p acute PE following COVID-19 with subsequent Xarelto and then Eliquis which was discontinued following therapy -Reports no recurrence of shortness of breath or residual symptoms    4.  Hyperlipidemia: -Patient/LDL cholesterol was controlled at 72 -Continue Pravachol 40 mg daily -Continue weekly exercise and low-sodium heart healthy diet     Disposition: Follow-up with Armanda Magic, MD or APP in 12 months    Medication Adjustments/Labs and Tests Ordered: Current medicines are reviewed at length with the patient today.  Concerns  regarding medicines are outlined above.   Signed, Napoleon Form, Leodis Rains, NP 12/09/2022, 9:38 AM [facility]

## 2022-12-09 ENCOUNTER — Encounter: Payer: Self-pay | Admitting: Nurse Practitioner

## 2022-12-09 ENCOUNTER — Ambulatory Visit: Payer: 59 | Attending: Nurse Practitioner | Admitting: Nurse Practitioner

## 2022-12-09 VITALS — BP 120/70 | HR 76 | Ht 73.0 in | Wt 270.8 lb

## 2022-12-09 DIAGNOSIS — I10 Essential (primary) hypertension: Secondary | ICD-10-CM | POA: Diagnosis not present

## 2022-12-09 DIAGNOSIS — Z86711 Personal history of pulmonary embolism: Secondary | ICD-10-CM

## 2022-12-09 DIAGNOSIS — I480 Paroxysmal atrial fibrillation: Secondary | ICD-10-CM | POA: Diagnosis not present

## 2022-12-09 DIAGNOSIS — E785 Hyperlipidemia, unspecified: Secondary | ICD-10-CM

## 2022-12-09 LAB — BASIC METABOLIC PANEL
BUN/Creatinine Ratio: 14 (ref 10–24)
BUN: 21 mg/dL (ref 8–27)
CO2: 28 mmol/L (ref 20–29)
Calcium: 9.8 mg/dL (ref 8.6–10.2)
Chloride: 100 mmol/L (ref 96–106)
Creatinine, Ser: 1.5 mg/dL — ABNORMAL HIGH (ref 0.76–1.27)
Glucose: 126 mg/dL — ABNORMAL HIGH (ref 70–99)
Potassium: 3.6 mmol/L (ref 3.5–5.2)
Sodium: 142 mmol/L (ref 134–144)
eGFR: 52 mL/min/{1.73_m2} — ABNORMAL LOW (ref >59)

## 2022-12-09 NOTE — Patient Instructions (Addendum)
Medication Instructions:  Your physician recommends that you continue on your current medications as directed. Please refer to the Current Medication list given to you today. *If you need a refill on your cardiac medications before your next appointment, please call your pharmacy*   Lab Work: TODAY-BMET If you have labs (blood work) drawn today and your tests are completely normal, you will receive your results only by: MyChart Message (if you have MyChart) OR A paper copy in the mail If you have any lab test that is abnormal or we need to change your treatment, we will call you to review the results.   Testing/Procedures: NONE ORDERED   Follow-Up: At [facility], you and your health needs are our priority.  As part of our continuing mission to provide you with exceptional heart care, we have created designated Provider Care Teams.  These Care Teams include your primary Cardiologist (physician) and Advanced Practice Providers (APPs -  Physician Assistants and Nurse Practitioners) who all work together to provide you with the care you need, when you need it.  We recommend signing up for the patient portal called "MyChart".  Sign up information is provided on this After Visit Summary.  MyChart is used to connect with patients for Virtual Visits (Telemedicine).  Patients are able to view lab/test results, encounter notes, upcoming appointments, etc.  Non-urgent messages can be sent to your provider as well.   To learn more about what you can do with MyChart, go to [email or web address].    Your next appointment:   12 month(s)  Provider:   Armanda Magic, MD  or Robin Searing, NP   Other Instructions

## 2022-12-10 ENCOUNTER — Telehealth: Payer: Self-pay

## 2022-12-10 NOTE — Telephone Encounter (Addendum)
Left voice message asking patient to give our office a call to discuss recent lab results and plan. Will try calling again.  ----- Message from Gaston Islam., NP sent at 12/10/2022 11:22 AM EDT ----- Please let patient know that his renal function is stable but still above normal and overall electrolytes are normal.  Plan: -Please refer patient to nephrology for further evaluation and treatment. -Please make sure to stay hydrated with at least 64 ounces of water daily. -Please continue your current medications as prescribed.   Robin Searing, NP

## 2022-12-11 ENCOUNTER — Other Ambulatory Visit: Payer: Self-pay

## 2022-12-11 DIAGNOSIS — N189 Chronic kidney disease, unspecified: Secondary | ICD-10-CM

## 2022-12-16 ENCOUNTER — Telehealth: Payer: Self-pay | Admitting: *Deleted

## 2022-12-16 NOTE — Telephone Encounter (Signed)
Attempted to reach pt twice by phone.  No answer either time and LMOM.  With second phone call, left message to call office back to reschedule PV by 5:00 pm today or colonoscopy on 01-12-23 will be cancelled.

## 2022-12-16 NOTE — Telephone Encounter (Signed)
Pt didn't call back to [facility] PV.  Colonoscopy on 01-12-23 cancelled and NOS letter mailed to pt

## 2022-12-23 ENCOUNTER — Other Ambulatory Visit: Payer: Self-pay | Admitting: Medical

## 2023-01-12 ENCOUNTER — Encounter: Payer: Self-pay | Admitting: Gastroenterology

## 2023-02-01 ENCOUNTER — Other Ambulatory Visit: Payer: Self-pay | Admitting: Medical

## 2023-02-08 DIAGNOSIS — N189 Chronic kidney disease, unspecified: Secondary | ICD-10-CM

## 2023-02-11 ENCOUNTER — Ambulatory Visit: Payer: Self-pay | Admitting: Cardiology

## 2023-02-24 DIAGNOSIS — E785 Hyperlipidemia, unspecified: Secondary | ICD-10-CM | POA: Diagnosis not present

## 2023-02-24 DIAGNOSIS — N1831 Chronic kidney disease, stage 3a: Secondary | ICD-10-CM | POA: Diagnosis not present

## 2023-02-24 DIAGNOSIS — I129 Hypertensive chronic kidney disease with stage 1 through stage 4 chronic kidney disease, or unspecified chronic kidney disease: Secondary | ICD-10-CM | POA: Diagnosis not present

## 2023-02-24 DIAGNOSIS — I480 Paroxysmal atrial fibrillation: Secondary | ICD-10-CM | POA: Diagnosis not present

## 2023-02-24 LAB — LAB REPORT - SCANNED
Albumin, Urine POC: 8.6
Creatinine, POC: 195.9 mg/dL
EGFR: 57

## 2023-03-29 DIAGNOSIS — R059 Cough, unspecified: Secondary | ICD-10-CM | POA: Diagnosis not present

## 2023-03-29 DIAGNOSIS — H6123 Impacted cerumen, bilateral: Secondary | ICD-10-CM | POA: Diagnosis not present

## 2023-04-24 ENCOUNTER — Other Ambulatory Visit: Payer: Self-pay | Admitting: Family Medicine

## 2023-05-12 ENCOUNTER — Other Ambulatory Visit: Payer: Self-pay | Admitting: Family Medicine

## 2023-06-11 DIAGNOSIS — M19012 Primary osteoarthritis, left shoulder: Secondary | ICD-10-CM | POA: Diagnosis not present

## 2023-06-27 ENCOUNTER — Other Ambulatory Visit: Payer: Self-pay | Admitting: Family Medicine

## 2023-07-16 ENCOUNTER — Other Ambulatory Visit: Payer: Self-pay | Admitting: Family Medicine

## 2023-07-16 DIAGNOSIS — E785 Hyperlipidemia, unspecified: Secondary | ICD-10-CM

## 2023-07-27 ENCOUNTER — Encounter: Payer: 59 | Admitting: Family Medicine

## 2023-08-07 DIAGNOSIS — R7303 Prediabetes: Secondary | ICD-10-CM | POA: Insufficient documentation

## 2023-08-09 ENCOUNTER — Encounter: Payer: Self-pay | Admitting: Family Medicine

## 2023-08-09 ENCOUNTER — Ambulatory Visit (INDEPENDENT_AMBULATORY_CARE_PROVIDER_SITE_OTHER): Payer: Medicare Other | Admitting: Family Medicine

## 2023-08-09 VITALS — BP 128/78 | HR 67 | Ht 73.25 in | Wt 262.0 lb

## 2023-08-09 DIAGNOSIS — Z860101 Personal history of adenomatous and serrated colon polyps: Secondary | ICD-10-CM

## 2023-08-09 DIAGNOSIS — Z23 Encounter for immunization: Secondary | ICD-10-CM | POA: Diagnosis not present

## 2023-08-09 DIAGNOSIS — Z87438 Personal history of other diseases of male genital organs: Secondary | ICD-10-CM

## 2023-08-09 DIAGNOSIS — I10 Essential (primary) hypertension: Secondary | ICD-10-CM | POA: Diagnosis not present

## 2023-08-09 DIAGNOSIS — D539 Nutritional anemia, unspecified: Secondary | ICD-10-CM | POA: Diagnosis not present

## 2023-08-09 DIAGNOSIS — I119 Hypertensive heart disease without heart failure: Secondary | ICD-10-CM

## 2023-08-09 DIAGNOSIS — G4733 Obstructive sleep apnea (adult) (pediatric): Secondary | ICD-10-CM

## 2023-08-09 DIAGNOSIS — E785 Hyperlipidemia, unspecified: Secondary | ICD-10-CM | POA: Diagnosis not present

## 2023-08-09 DIAGNOSIS — H25013 Cortical age-related cataract, bilateral: Secondary | ICD-10-CM

## 2023-08-09 DIAGNOSIS — R7303 Prediabetes: Secondary | ICD-10-CM

## 2023-08-09 DIAGNOSIS — Z0000 Encounter for general adult medical examination without abnormal findings: Secondary | ICD-10-CM | POA: Diagnosis not present

## 2023-08-09 DIAGNOSIS — N528 Other male erectile dysfunction: Secondary | ICD-10-CM

## 2023-08-09 DIAGNOSIS — E038 Other specified hypothyroidism: Secondary | ICD-10-CM | POA: Diagnosis not present

## 2023-08-09 DIAGNOSIS — J301 Allergic rhinitis due to pollen: Secondary | ICD-10-CM | POA: Diagnosis not present

## 2023-08-09 DIAGNOSIS — I480 Paroxysmal atrial fibrillation: Secondary | ICD-10-CM

## 2023-08-09 LAB — POCT GLYCOSYLATED HEMOGLOBIN (HGB A1C): Hemoglobin A1C: 5.9 % — AB (ref 4.0–5.6)

## 2023-08-09 LAB — LIPID PANEL

## 2023-08-09 MED ORDER — CARVEDILOL 25 MG PO TABS: 25.0000 mg | ORAL_TABLET | Freq: Two times a day (BID) | ORAL | 3 refills | Status: DC

## 2023-08-09 MED ORDER — DILTIAZEM HCL ER COATED BEADS 300 MG PO CP24: 300.0000 mg | ORAL_CAPSULE | Freq: Every day | ORAL | 3 refills | Status: DC

## 2023-08-09 MED ORDER — CHLORTHALIDONE 25 MG PO TABS: 25.0000 mg | ORAL_TABLET | Freq: Every day | ORAL | 3 refills | Status: DC

## 2023-08-09 MED ORDER — LOSARTAN POTASSIUM 50 MG PO TABS: 50.0000 mg | ORAL_TABLET | Freq: Every day | ORAL | 3 refills | Status: DC

## 2023-08-09 MED ORDER — PRAVASTATIN SODIUM 40 MG PO TABS: 40.0000 mg | ORAL_TABLET | Freq: Every day | ORAL | 3 refills | Status: DC

## 2023-08-09 NOTE — Progress Notes (Signed)
Subjective:    Martin [name] is a 63 y.o. male who presents for a welcome to Medicare visit and chronic medical problems/med check visit.  He is being followed at the [facility] for his left shoulder and is still getting physical therapy for that.  He also has seen cardiology in the past for PAF and also has a history of pulmonary embolus secondary to gallbladder surgery.  He does have OSA but is not using CPAP.  He could not tolerate this.  He does have an appointment set up for colonoscopy and does have a history of cataract but does need to be set up to see ophthalmology.  There is a history of hypothyroid but presently is not on medication.  Presently he does not complain of BPH symptoms or ED problems.  Does have a previous history of prediabetes.  Does have allergies and seems to have these under good control.  Continues on Pravachol as well as Cozaar and losartan.  Primary Care Provider Ronnald Nian, MD here for primary care  Current Health Care Team: Dentist, Dr. ? Eye doctor, Dr. Dione Booze    Exercise Current exercise habits: The patient does not participate in regular exercise at present.   Nutrition/Diet Current diet: in general, a "healthy" diet    Depression Screen    08/09/2023   10:26 AM  Depression screen PHQ 2/9  Decreased Interest 0  Down, Depressed, Hopeless 0  PHQ - 2 Score 0    Activities of Daily Living Screen/Functional Status Survey Is the patient deaf or have difficulty hearing?: Yes (sometimes,) Does the patient have difficulty seeing, even when wearing glasses/contacts?: No Does the patient have difficulty concentrating, remembering, or making decisions?: No Does the patient have difficulty walking or climbing stairs?: No Does the patient have difficulty dressing or bathing?: No Does the patient have difficulty doing errands alone such as visiting a doctor's office or shopping?: No  Can patient draw a clock face showing 3:15 oclock, yes  Fall Risk  Screen    08/09/2023   10:26 AM 07/13/2022    9:18 AM 04/03/2016    8:15 AM  Fall Risk   Falls in the past year? 0 0 No  Number falls in past yr: 0 0   Injury with Fall? 0 0   Risk for fall due to : No Fall Risks No Fall Risks   Follow up Falls evaluation completed Falls evaluation completed     Gait Assessment: Normal gait observed yes  Advanced directives Does patient have a Health Care Power of Attorney? No Does patient have a Living Will? No Info given  Past Medical History:  Diagnosis Date   Cardiomegaly    LVH   Cataract    Dysrhythmia    afib   Hemorrhoids    Hyperlipidemia    Hypertension    Hypothyroidism    Lower GI bleeding    OSA (obstructive sleep apnea)    mild OSA with an AHI of 8.1/hr but severe during REM sleep with AHI 43/hr.  He had O2 desats as low as 85%.   PAF (paroxysmal atrial fibrillation) (HCC)    in setting of acute cholecystitis.  No anticoagulation due to setting of acute illness and CHADS2VAS score of 1    Past Surgical History:  Procedure Laterality Date   CHOLECYSTECTOMY N/A 06/01/2019   Procedure: LAPAROSCOPIC CHOLECYSTECTOMY;  Surgeon: Manus Rudd, MD;  Location: [facility] OR;  Service: General;  Laterality: N/A;   DOPPLER ECHOCARDIOGRAPHY  HEMORRHOID SURGERY N/A 01/03/2021   Procedure: HEMORRHOIDECTOMY;  Surgeon: Fritzi Mandes, MD;  Location: [facility] OR;  Service: General;  Laterality: N/A;   HERRIODHOIDEECTOMY  10/02/2020   SEE NOTE IN CHART   RECTAL EXAM UNDER ANESTHESIA N/A 01/03/2021   Procedure: RECTAL EXAM UNDER ANESTHESIA;  Surgeon: Fritzi Mandes, MD;  Location: [facility] OR;  Service: General;  Laterality: N/A;    Social History   Socioeconomic History   Marital status: Married    Spouse name: Not on file   Number of children: 1   Years of education: Not on file   Highest education level: Not on file  Occupational History   Occupation: floor tech    Employer: [employer]  Tobacco Use   Smoking status: Never   Smokeless tobacco:  Never  Vaping Use   Vaping status: Never Used  Substance and Sexual Activity   Alcohol use: Yes    Comment: Rare   Drug use: No   Sexual activity: Yes  Other Topics Concern   Not on file  Social History Narrative   Not on file   Social Drivers of Health   Financial Resource Strain: Low Risk  (08/07/2023)   Overall Financial Resource Strain (CARDIA)    Difficulty of Paying Living Expenses: Not hard at all  Food Insecurity: No Food Insecurity (08/07/2023)   Hunger Vital Sign    Worried About Running Out of Food in the Last Year: Never true    Ran Out of Food in the Last Year: Never true  Transportation Needs: No Transportation Needs (08/07/2023)   PRAPARE - Administrator, Civil Service (Medical): No    Lack of Transportation (Non-Medical): No  Physical Activity: Insufficiently Active (08/07/2023)   Exercise Vital Sign    Days of Exercise per Week: 1 day    Minutes of Exercise per Session: 10 min  Stress: No Stress Concern Present (08/07/2023)   [employer] of Occupational Health - Occupational Stress Questionnaire    Feeling of Stress : Not at all  Social Connections: Moderately Integrated (08/07/2023)   Social Connection and Isolation Panel [NHANES]    Frequency of Communication with Friends and Family: Once a week    Frequency of Social Gatherings with Friends and Family: Once a week    Attends Religious Services: More than 4 times per year    Active Member of [employer] or Organizations: Yes    Attends Engineer, structural: More than 4 times per year    Marital Status: Married  Catering manager Violence: Not on file    Family History  Problem Relation Age of Onset   Kidney disease Mother    Hypertension Father    Kidney disease Father    Asthma Father      Current Outpatient Medications:    acetaminophen (TYLENOL) 500 MG tablet, Take 500 mg by mouth every 6 (six) hours as needed for moderate pain or headache., Disp: , Rfl:     carboxymethylcellulose (REFRESH PLUS) 0.5 % SOLN, Place 1 drop into both eyes daily as needed (dry eyes)., Disp: , Rfl:    FIBER ADULT GUMMIES PO, Take 1 capsule by mouth daily as needed (constipation)., Disp: , Rfl:    Multiple Vitamin (MULTIVITAMIN) capsule, Take 1 capsule by mouth daily., Disp: , Rfl:    carvedilol (COREG) 25 MG tablet, Take 1 tablet (25 mg total) by mouth 2 (two) times daily., Disp: 180 tablet, Rfl: 3   chlorthalidone (HYGROTON) 25 MG tablet, Take 1  tablet (25 mg total) by mouth daily., Disp: 90 tablet, Rfl: 3   diltiazem (CARDIZEM CD) 300 MG 24 hr capsule, Take 1 capsule (300 mg total) by mouth daily., Disp: 90 capsule, Rfl: 3   losartan (COZAAR) 50 MG tablet, Take 1 tablet (50 mg total) by mouth daily., Disp: 90 tablet, Rfl: 3   pravastatin (PRAVACHOL) 40 MG tablet, Take 1 tablet (40 mg total) by mouth daily., Disp: 90 tablet, Rfl: 3  Allergies  Allergen Reactions   Garlic Swelling   Shellfish Allergy Swelling    History reviewed: allergies, current medications, past family history, past medical history, past social history, past surgical history and problem list    Objective:      Biometrics BP 128/78   Pulse 67   Ht 6' 1.25" (1.861 m)   Wt 262 lb (118.8 kg)   BMI 34.33 kg/m   Cognitive Testing  Alert? Yes  Normal Appearance?Yes  Oriented to person? Yes  Place? Yes   Time? Yes  Recall of three objects?  Yes  Can perform simple calculations? Yes  Displays appropriate judgment?Yes  Can read the correct time from a watch face?Yes  General appearance: alert, no distress, WD/WN,  male   Other pertinent exam: HEENT: normocephalic, sclerae anicteric, TMs pearly, nares patent, no discharge or erythema, pharynx normal Oral cavity: MMM, no lesions Neck: supple, no lymphadenopathy, no thyromegaly, no masses Heart: RRR, normal S1, S2, no murmurs Lungs: CTA bilaterally, no wheezes, rhonchi, or rales Abdomen: +bs, soft, non tender, non distended, no masses,  no hepatomegaly, no splenomegaly Musculoskeletal: nontender, no swelling, no obvious deformity Extremities: no edema, no cyanosis, no clubbing Pulses: 2+ symmetric, upper and lower extremities, normal cap refill Neurological: alert, oriented x 3, CN2-12 intact, strength normal upper extremities and lower extremities, sensation normal throughout, DTRs 2+ throughout, no cerebellar signs, gait normal Psychiatric: normal affect, behavior normal, pleasant  Hemoglobin A1c is 5.9  Assessment:  Routine general medical examination at a health care facility  Seasonal allergic rhinitis due to pollen  Cortical age-related cataract of both eyes - Plan: Ambulatory referral to Ophthalmology  Essential hypertension - Plan: CBC with Differential/Platelet, Comprehensive metabolic panel, diltiazem (CARDIZEM CD) 300 MG 24 hr capsule, carvedilol (COREG) 25 MG tablet, chlorthalidone (HYGROTON) 25 MG tablet, losartan (COZAAR) 50 MG tablet  History of BPH  Hx of adenomatous colonic polyps  Hyperlipidemia with target LDL less than 100 - Plan: Lipid panel, pravastatin (PRAVACHOL) 40 MG tablet  Other specified hypothyroidism - Plan: TSH  Hypertensive left ventricular hypertrophy, without heart failure - Plan: CBC with Differential/Platelet, Comprehensive metabolic panel, Lipid panel  Macrocytic anemia  OSA (obstructive sleep apnea)  Other male erectile dysfunction  PAF (paroxysmal atrial fibrillation) (HCC)  Prediabetes - Plan: POCT glycosylated hemoglobin (Hb A1C)  Need for vaccination against Streptococcus pneumoniae - Plan: Pneumococcal conjugate vaccine 20-valent (Prevnar 20)  Encounter Diagnoses  Name Primary?   Seasonal allergic rhinitis due to pollen    Cortical age-related cataract of both eyes    Essential hypertension    History of BPH    Hx of adenomatous colonic polyps    Hyperlipidemia with target LDL less than 100    Other specified hypothyroidism    Hypertensive left ventricular  hypertrophy, without heart failure    Macrocytic anemia    OSA (obstructive sleep apnea)    Other male erectile dysfunction    PAF (paroxysmal atrial fibrillation) (HCC)    Routine general medical examination at a health care facility Yes  Prediabetes    Need for vaccination against Streptococcus pneumoniae      Plan:   A preventative services visit was completed today.  During the course of the visit today, we discussed and counseled about appropriate screening and preventive services.  A health risk assessment was established today that included a review of current medications, allergies, social history, family history, medical and preventative health history, biometrics, and preventative screenings to identify potential safety concerns or impairments.  A personalized plan was printed today for your records and use.   Personalized health advice and education was given today to reduce health risks and promote self management and wellness.  Information regarding end of life planning was discussed today.   Recommendations: I recommend a yearly ophthalmology/optometry visit for glaucoma screening and eye checkup I recommended a yearly dental visit for hygiene and checkup Advanced directives - discussed nature and purpose of Advanced Directives, encouraged them to complete them if they have not done so and/or encouraged them to get [place] a copy if they have done this already. Referrals today: Ophthalmology  Immunizations: I recommended a yearly influenza vaccine, typically in September when the vaccine is usually available Is the Pneumococcal vaccine up to date: yes. Is the Shingles vaccine up to date: yes.   Is the Td/Tdap vaccine up to date: yes.   Medicare Attestation A preventative services visit was completed today.  During the course of the visit the patient was educated and counseled about appropriate screening and preventive services.  A health risk assessment was established with  the patient that included a review of current medications, allergies, social history, family history, medical and preventative health history, biometrics, and preventative screenings to identify potential safety concerns or impairments.  A personalized plan was printed today for the patient's records and use.   Personalized health advice and education was given today to reduce health risks and promote self management and wellness.  Information regarding end of life planning was discussed today. Follow up  one year Sharlot Gowda, MD   08/09/2023

## 2023-08-09 NOTE — Patient Instructions (Signed)
  Mr. [place] , Thank you for taking time to come for your Medicare Wellness Visit. I appreciate your ongoing commitment to your health goals. Please review the following plan we discussed and let me know if I can assist you in the future.   These are the goals we discussed: 20 minutes of something physical every day.  Look into cutting down on white food   This is a list of the screening recommended for you and due dates:  Health Maintenance  Topic Date Due   Medicare Annual Wellness Visit  Never done   Colon Cancer Screening  05/09/2012   Flu Shot  01/21/2023   Pneumonia Vaccine (1 of 1 - PCV) Never done   DTaP/Tdap/Td vaccine (4 - Td or Tdap) 06/13/2029   COVID-19 Vaccine  Completed   Hepatitis C Screening  Completed   HIV Screening  Completed   Zoster (Shingles) Vaccine  Completed   HPV Vaccine  Aged Out

## 2023-08-10 ENCOUNTER — Encounter: Payer: Self-pay | Admitting: Family Medicine

## 2023-08-10 LAB — LIPID PANEL
Cholesterol, Total: 123 mg/dL (ref 100–199)
HDL: 38 mg/dL — ABNORMAL LOW (ref >39)
LDL CALC COMMENT:: 3.2 ratio (ref 0.0–5.0)
LDL Chol Calc (NIH): 69 mg/dL (ref 0–99)
Triglycerides: 83 mg/dL (ref 0–149)
VLDL Cholesterol Cal: 16 mg/dL (ref 5–40)

## 2023-08-10 LAB — CBC WITH DIFFERENTIAL/PLATELET
Basophils Absolute: 0.1 10*3/uL (ref 0.0–0.2)
Basos: 1 %
EOS (ABSOLUTE): 0.2 10*3/uL (ref 0.0–0.4)
Eos: 3 %
Hematocrit: 43.5 % (ref 37.5–51.0)
Hemoglobin: 13.9 g/dL (ref 13.0–17.7)
Immature Grans (Abs): 0 10*3/uL (ref 0.0–0.1)
Immature Granulocytes: 0 %
Lymphocytes Absolute: 1.7 10*3/uL (ref 0.7–3.1)
Lymphs: 27 %
MCH: 30.7 pg (ref 26.6–33.0)
MCHC: 32 g/dL (ref 31.5–35.7)
MCV: 96 fL (ref 79–97)
Monocytes Absolute: 0.5 10*3/uL (ref 0.1–0.9)
Monocytes: 8 %
Neutrophils Absolute: 4 10*3/uL (ref 1.4–7.0)
Neutrophils: 61 %
Platelets: 178 10*3/uL (ref 150–450)
RBC: 4.53 x10E6/uL (ref 4.14–5.80)
RDW: 12.4 % (ref 11.6–15.4)
WBC: 6.4 10*3/uL (ref 3.4–10.8)

## 2023-08-10 LAB — COMPREHENSIVE METABOLIC PANEL
ALT: 13 IU/L (ref 0–44)
AST: 12 IU/L (ref 0–40)
Albumin: 4 g/dL (ref 3.9–4.9)
Alkaline Phosphatase: 61 IU/L (ref 44–121)
BUN/Creatinine Ratio: 14 (ref 10–24)
BUN: 20 mg/dL (ref 8–27)
Bilirubin Total: 1.3 mg/dL — ABNORMAL HIGH (ref 0.0–1.2)
CO2: 22 mmol/L (ref 20–29)
Calcium: 9.5 mg/dL (ref 8.6–10.2)
Chloride: 103 mmol/L (ref 96–106)
Creatinine, Ser: 1.43 mg/dL — ABNORMAL HIGH (ref 0.76–1.27)
Globulin, Total: 2.8 g/dL (ref 1.5–4.5)
Glucose: 117 mg/dL — ABNORMAL HIGH (ref 70–99)
Potassium: 3.8 mmol/L (ref 3.5–5.2)
Sodium: 146 mmol/L — ABNORMAL HIGH (ref 134–144)
Total Protein: 6.8 g/dL (ref 6.0–8.5)
eGFR: 54 mL/min/{1.73_m2} — ABNORMAL LOW (ref >59)

## 2023-08-10 LAB — TSH: TSH: 3.8 u[IU]/mL (ref 0.450–4.500)

## 2023-08-11 ENCOUNTER — Other Ambulatory Visit: Payer: Self-pay

## 2023-08-11 DIAGNOSIS — E785 Hyperlipidemia, unspecified: Secondary | ICD-10-CM

## 2023-08-11 DIAGNOSIS — I10 Essential (primary) hypertension: Secondary | ICD-10-CM

## 2023-08-11 MED ORDER — CARVEDILOL 25 MG PO TABS: 25.0000 mg | ORAL_TABLET | Freq: Two times a day (BID) | ORAL | 3 refills | Status: AC

## 2023-08-11 MED ORDER — DILTIAZEM HCL ER COATED BEADS 300 MG PO CP24: 300.0000 mg | ORAL_CAPSULE | Freq: Every day | ORAL | 3 refills | Status: AC

## 2023-08-11 MED ORDER — LOSARTAN POTASSIUM 50 MG PO TABS: 50.0000 mg | ORAL_TABLET | Freq: Every day | ORAL | 3 refills | Status: AC

## 2023-08-11 MED ORDER — PRAVASTATIN SODIUM 40 MG PO TABS: 40.0000 mg | ORAL_TABLET | Freq: Every day | ORAL | 3 refills | Status: AC

## 2023-08-11 MED ORDER — CHLORTHALIDONE 25 MG PO TABS: 25.0000 mg | ORAL_TABLET | Freq: Every day | ORAL | 3 refills | Status: AC

## 2023-08-17 ENCOUNTER — Encounter: Payer: Self-pay | Admitting: Internal Medicine

## 2023-09-13 DIAGNOSIS — E785 Hyperlipidemia, unspecified: Secondary | ICD-10-CM | POA: Diagnosis not present

## 2023-09-13 DIAGNOSIS — I480 Paroxysmal atrial fibrillation: Secondary | ICD-10-CM | POA: Diagnosis not present

## 2023-09-13 DIAGNOSIS — N1831 Chronic kidney disease, stage 3a: Secondary | ICD-10-CM | POA: Diagnosis not present

## 2023-09-13 DIAGNOSIS — G4733 Obstructive sleep apnea (adult) (pediatric): Secondary | ICD-10-CM | POA: Diagnosis not present

## 2023-09-13 DIAGNOSIS — I129 Hypertensive chronic kidney disease with stage 1 through stage 4 chronic kidney disease, or unspecified chronic kidney disease: Secondary | ICD-10-CM | POA: Diagnosis not present

## 2023-10-06 DIAGNOSIS — H2513 Age-related nuclear cataract, bilateral: Secondary | ICD-10-CM | POA: Diagnosis not present

## 2023-10-06 DIAGNOSIS — H40033 Anatomical narrow angle, bilateral: Secondary | ICD-10-CM | POA: Diagnosis not present

## 2023-10-10 ENCOUNTER — Encounter (HOSPITAL_COMMUNITY): Payer: Self-pay | Admitting: Emergency Medicine

## 2023-10-10 ENCOUNTER — Ambulatory Visit (HOSPITAL_COMMUNITY): Admission: EM | Admit: 2023-10-10 | Discharge: 2023-10-10 | Disposition: A | Discharge status: Home / Self Care

## 2023-10-10 DIAGNOSIS — M25572 Pain in left ankle and joints of left foot: Secondary | ICD-10-CM | POA: Diagnosis not present

## 2023-10-10 MED ORDER — PREDNISONE 20 MG PO TABS: 40.0000 mg | ORAL_TABLET | Freq: Every day | ORAL | 0 refills | Status: AC

## 2023-10-10 NOTE — ED Provider Notes (Signed)
 [facility]    CSN: [id number] Arrival date & time: 10/10/23  1141      History   Chief Complaint Chief Complaint  Patient presents with   Foot Pain    HPI General Martin [name]  is a 63 y.o. male.   Patient here today for evaluation of pain and swelling to left foot.  He has not had any injury.  He does not have a history of gout that he is aware of.  He denies fever.  Pain is primarily located to first MTP  The history is provided by the patient.  Foot Pain Pertinent negatives include no shortness of breath.    Past Medical History:  Diagnosis Date   Cardiomegaly    LVH   Cataract    Dysrhythmia    afib   Hemorrhoids    Hyperlipidemia    Hypertension    Hypothyroidism    Lower GI bleeding    OSA (obstructive sleep apnea)    mild OSA with an AHI of 8.1/hr but severe during REM sleep with AHI 43/hr.  He had O2 desats as low as 85%.   PAF (paroxysmal atrial fibrillation) (HCC)    in setting of acute cholecystitis.  No anticoagulation due to setting of acute illness and CHADS2VAS score of 1    Patient Active Problem List   Diagnosis Date Noted   Prediabetes 08/07/2023   Macrocytic anemia 07/22/2020   Hx of adenomatous colonic polyps 07/16/2020   History of COVID-19 06/11/2020   History of pulmonary embolus (PE) 05/27/2020   OSA (obstructive sleep apnea)    PAF (paroxysmal atrial fibrillation) (HCC)    History of cholecystectomy    History of BPH 04/03/2016   Allergic rhinitis due to pollen 02/18/2015   Other male erectile dysfunction 02/18/2015   Cataract 02/15/2014   Hemorrhoids 02/10/2013   Essential hypertension 03/17/2011   Hyperlipidemia with target LDL less than 100 03/17/2011   LVH (left ventricular hypertrophy) due to hypertensive disease 03/17/2011   Hypothyroid 03/17/2011    Past Surgical History:  Procedure Laterality Date   CHOLECYSTECTOMY N/A 06/01/2019   Procedure: LAPAROSCOPIC CHOLECYSTECTOMY;  Surgeon: Dareen Ebbing, MD;   Location: [facility] OR;  Service: General;  Laterality: N/A;   DOPPLER ECHOCARDIOGRAPHY     HEMORRHOID SURGERY N/A 01/03/2021   Procedure: HEMORRHOIDECTOMY;  Surgeon: Lujean Sake, MD;  Location: [facility] OR;  Service: General;  Laterality: N/A;   HERRIODHOIDEECTOMY  10/02/2020   SEE NOTE IN CHART   RECTAL EXAM UNDER ANESTHESIA N/A 01/03/2021   Procedure: RECTAL EXAM UNDER ANESTHESIA;  Surgeon: Lujean Sake, MD;  Location: [facility] OR;  Service: General;  Laterality: N/A;       Home Medications    Prior to Admission medications   Medication Sig Start Date End Date Taking? Authorizing Provider  diltiazem  (TIAZAC ) 300 MG 24 hr capsule Take 300 mg by mouth daily. 08/12/23  Yes [provider]  predniSONE  (DELTASONE ) 20 MG tablet Take 2 tablets (40 mg total) by mouth daily with breakfast for 5 days. 10/10/23 10/15/23 Yes Vernestine Gondola, PA-C  acetaminophen  (TYLENOL ) 500 MG tablet Take 500 mg by mouth every 6 (six) hours as needed for moderate pain or headache.    [provider]  carboxymethylcellulose (REFRESH PLUS) 0.5 % SOLN Place 1 drop into both eyes daily as needed (dry eyes).    [provider]  carvedilol  (COREG ) 25 MG tablet Take 1 tablet (25 mg total) by mouth 2 (two) times daily. 08/11/23  Watson Hacking, MD  chlorthalidone  (HYGROTON ) 25 MG tablet Take 1 tablet (25 mg total) by mouth daily. 08/11/23   Watson Hacking, MD  diltiazem  (CARDIZEM  CD) 300 MG 24 hr capsule Take 1 capsule (300 mg total) by mouth daily. 08/11/23   Watson Hacking, MD  FIBER ADULT GUMMIES PO Take 1 capsule by mouth daily as needed (constipation).    [provider]  losartan  (COZAAR ) 50 MG tablet Take 1 tablet (50 mg total) by mouth daily. 08/11/23   Lalonde, [name], MD  Multiple Vitamin (MULTIVITAMIN) capsule Take 1 capsule by mouth daily.    [provider]  pravastatin  (PRAVACHOL ) 40 MG tablet Take 1 tablet (40 mg total) by mouth daily. 08/11/23   Watson Hacking, MD  apixaban   (ELIQUIS ) 5 MG TABS tablet Take 1 tablet (5 mg total) by mouth 2 (two) times daily. 06/24/20 06/28/20  Watson Hacking, MD  pantoprazole  (PROTONIX ) 40 MG tablet Take 1 tablet (40 mg total) by mouth daily. 05/28/20 07/03/20  Elgergawy, Ardia Kraft, MD    Family History Family History  Problem Relation Age of Onset   Kidney disease Mother    Hypertension Father    Kidney disease Father    Asthma Father     Social History Social History   Tobacco Use   Smoking status: Never   Smokeless tobacco: Never  Vaping Use   Vaping status: Never Used  Substance Use Topics   Alcohol  use: Yes    Comment: Rare   Drug use: No     Allergies   Garlic and Shellfish allergy   Review of Systems Review of Systems  Constitutional:  Negative for chills and fever.  Eyes:  Negative for discharge and redness.  Respiratory:  Negative for shortness of breath.   Musculoskeletal:  Positive for arthralgias and joint swelling.  Neurological:  Negative for numbness.     Physical Exam Triage Vital Signs ED Triage Vitals  Encounter Vitals Group     BP 10/10/23 1158 93/65     Systolic BP Percentile --      Diastolic BP Percentile --      Pulse Rate 10/10/23 1158 75     Resp 10/10/23 1158 18     Temp 10/10/23 1158 98 F (36.7 C)     Temp Source 10/10/23 1158 Oral     SpO2 10/10/23 1158 93 %     Weight --      Height --      Head Circumference --      Peak Flow --      Pain Score 10/10/23 1157 10     Pain Loc --      Pain Education --      Exclude from Growth Chart --    No data found.  Updated Vital Signs BP 93/65 (BP Location: Left Arm)   Pulse 75   Temp 98 F (36.7 C) (Oral)   Resp 18   SpO2 93%   Visual Acuity Right Eye Distance:   Left Eye Distance:   Bilateral Distance:    Right Eye Near:   Left Eye Near:    Bilateral Near:     Physical Exam Vitals and nursing note reviewed.  Constitutional:      General: He is not in acute distress.    Appearance: Normal appearance. He is  not ill-appearing.  HENT:     Head: Normocephalic and atraumatic.  Eyes:     Conjunctiva/sclera: Conjunctivae normal.  Cardiovascular:     Rate and Rhythm: Normal rate.  Pulmonary:     Effort: Pulmonary effort is normal. No respiratory distress.  Musculoskeletal:     Comments: Diffuse swelling with mild erythema appreciated to left first MTP with tenderness to palpation to same, decreased range of motion of toes due to pain.  Relatively normal range of motion of left ankle  Neurological:     Mental Status: He is alert.  Psychiatric:        Mood and Affect: Mood normal.        Behavior: Behavior normal.        Thought Content: Thought content normal.      UC Treatments / Results  Labs (all labs ordered are listed, but only abnormal results are displayed) Labs Reviewed - No data to display  EKG   Radiology No results found.  Procedures Procedures (including critical care time)  Medications Ordered in UC Medications - No data to display  Initial Impression / Assessment and Plan / UC Course  I have reviewed the triage vital signs and the nursing notes.  Pertinent labs & imaging results that were available during my care of the patient were reviewed by me and considered in my medical decision making (see chart for details).    Will treat to cover possible arthritic cause of symptoms as it does appear is very consistent with gout.  Discussed same with patient.  Prednisone  prescribed and advised follow-up with primary care should symptoms persist.  Patient expressed understanding.  Final Clinical Impressions(s) / UC Diagnoses   Final diagnoses:  Pain in joint of left foot   Discharge Instructions   None    ED Prescriptions     Medication Sig Dispense Auth. Provider   predniSONE  (DELTASONE ) 20 MG tablet Take 2 tablets (40 mg total) by mouth daily with breakfast for 5 days. 10 tablet Vernestine Gondola, PA-C      PDMP not reviewed this encounter.   Vernestine Gondola, PA-C 10/10/23 [phone]

## 2023-10-10 NOTE — ED Triage Notes (Signed)
 Pt reports for a week having pain and swelling to left foot. Denies any injury.

## 2023-12-20 ENCOUNTER — Telehealth: Payer: Self-pay | Admitting: Cardiology

## 2023-12-20 NOTE — Telephone Encounter (Signed)
 Patient signed a release of information for his records from 2020 to present be sent to him.  Release faxed to [facility] today.

## 2024-01-09 ENCOUNTER — Encounter: Payer: Self-pay | Admitting: Cardiology

## 2024-01-11 NOTE — Progress Notes (Signed)
   01/11/2024  [name] , male   DOB: 08-05-1960, 63 y.o.   MRN: [id number]  Pharmacy Quality Measure Review  This patient is appearing on a report for being at risk of failing the adherence measure for cholesterol (statin) medications this calendar year.   Medication: Pravastatin  Last fill date: 12/13/23 for 90 day supply  Insurance report was not up to date. No action needed at this time.   Jon VEAR [place]au, PharmD Clinical Pharmacist [phone]

## 2024-02-24 ENCOUNTER — Ambulatory Visit: Admitting: Cardiology

## 2024-03-06 DIAGNOSIS — I129 Hypertensive chronic kidney disease with stage 1 through stage 4 chronic kidney disease, or unspecified chronic kidney disease: Secondary | ICD-10-CM | POA: Diagnosis not present

## 2024-03-06 DIAGNOSIS — G4733 Obstructive sleep apnea (adult) (pediatric): Secondary | ICD-10-CM | POA: Diagnosis not present

## 2024-03-06 DIAGNOSIS — N1831 Chronic kidney disease, stage 3a: Secondary | ICD-10-CM | POA: Diagnosis not present

## 2024-03-06 DIAGNOSIS — I480 Paroxysmal atrial fibrillation: Secondary | ICD-10-CM | POA: Diagnosis not present

## 2024-03-07 LAB — LAB REPORT - SCANNED
Albumin, Urine POC: 27.7
Creatinine, POC: 324.4 mg/dL
EGFR: 56
Microalb Creat Ratio: 9

## 2024-04-28 ENCOUNTER — Emergency Department (HOSPITAL_COMMUNITY)

## 2024-04-28 ENCOUNTER — Emergency Department (HOSPITAL_COMMUNITY)
Admission: EM | Admit: 2024-04-28 | Discharge: 2024-04-28 | Disposition: A | Discharge status: Home / Self Care | Attending: Emergency Medicine | Admitting: Emergency Medicine

## 2024-04-28 DIAGNOSIS — M25512 Pain in left shoulder: Secondary | ICD-10-CM | POA: Insufficient documentation

## 2024-04-28 DIAGNOSIS — Z79899 Other long term (current) drug therapy: Secondary | ICD-10-CM | POA: Diagnosis not present

## 2024-04-28 DIAGNOSIS — Z7901 Long term (current) use of anticoagulants: Secondary | ICD-10-CM | POA: Diagnosis not present

## 2024-04-28 DIAGNOSIS — I10 Essential (primary) hypertension: Secondary | ICD-10-CM | POA: Diagnosis not present

## 2024-04-28 MED ORDER — KETOROLAC TROMETHAMINE 15 MG/ML IJ SOLN
15.0000 mg | Freq: Once | INTRAMUSCULAR | Status: AC
  Administered 2024-04-28: 15 mg via INTRAMUSCULAR
  Filled 2024-04-28: qty 1

## 2024-04-28 MED ORDER — IBUPROFEN 600 MG PO TABS: 600.0000 mg | ORAL_TABLET | Freq: Four times a day (QID) | ORAL | 1 refills | Status: AC | PRN

## 2024-04-28 MED ORDER — ACETAMINOPHEN 500 MG PO TABS: 500.0000 mg | ORAL_TABLET | Freq: Four times a day (QID) | ORAL | 1 refills | Status: AC | PRN

## 2024-04-28 MED ORDER — OXYCODONE-ACETAMINOPHEN 5-325 MG PO TABS
1.0000 | ORAL_TABLET | Freq: Once | ORAL | Status: AC
  Administered 2024-04-28: 1 via ORAL
  Filled 2024-04-28: qty 1

## 2024-04-28 MED ORDER — ACETAMINOPHEN 500 MG PO TABS
500.0000 mg | ORAL_TABLET | Freq: Four times a day (QID) | ORAL | 0 refills | Status: AC | PRN
Start: 2024-04-28 — End: ?

## 2024-04-28 NOTE — Discharge Instructions (Signed)
 Please use Tylenol or ibuprofen for pain.  You may use 600 mg ibuprofen every 6 hours or 1000 mg of Tylenol every 6 hours.  You may choose to alternate between the 2.  This would be most effective.  Not to exceed 4 g of Tylenol within 24 hours.  Not to exceed 3200 mg ibuprofen 24 hours.
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
## 2024-08-09 ENCOUNTER — Ambulatory Visit: Payer: Medicare Other | Admitting: Family Medicine
